# Patient Record
Sex: Male | Born: 1945 | Race: Black or African American | Hispanic: No | Marital: Married | Smoking: Never smoker
Health system: Southern US, Community
[De-identification: ages and names within clinical notes are randomized; demographics above are authoritative.]

## PROBLEM LIST (undated history)

## (undated) DIAGNOSIS — J45909 Unspecified asthma, uncomplicated: Secondary | ICD-10-CM

## (undated) DIAGNOSIS — I10 Essential (primary) hypertension: Secondary | ICD-10-CM

## (undated) DIAGNOSIS — I499 Cardiac arrhythmia, unspecified: Secondary | ICD-10-CM

## (undated) DIAGNOSIS — G629 Polyneuropathy, unspecified: Secondary | ICD-10-CM

## (undated) DIAGNOSIS — E7800 Pure hypercholesterolemia, unspecified: Secondary | ICD-10-CM

## (undated) DIAGNOSIS — H3500 Unspecified background retinopathy: Secondary | ICD-10-CM

## (undated) DIAGNOSIS — E785 Hyperlipidemia, unspecified: Secondary | ICD-10-CM

## (undated) DIAGNOSIS — D649 Anemia, unspecified: Secondary | ICD-10-CM

## (undated) DIAGNOSIS — E119 Type 2 diabetes mellitus without complications: Secondary | ICD-10-CM

## (undated) HISTORY — DX: Polyneuropathy, unspecified: G62.9

## (undated) HISTORY — DX: Pure hypercholesterolemia, unspecified: E78.00

## (undated) HISTORY — DX: Essential (primary) hypertension: I10

## (undated) HISTORY — DX: Unspecified background retinopathy: H35.00

## (undated) HISTORY — DX: Hyperlipidemia, unspecified: E78.5

## (undated) HISTORY — DX: Type 2 diabetes mellitus without complications: E11.9

## (undated) HISTORY — DX: Anemia, unspecified: D64.9

## (undated) HISTORY — DX: Unspecified asthma, uncomplicated: J45.909

## (undated) HISTORY — PX: ANAL FISSURE REPAIR: SHX2312

## (undated) HISTORY — PX: OTHER SURGICAL HISTORY: SHX169

---

## 2002-08-06 ENCOUNTER — Ambulatory Visit (HOSPITAL_COMMUNITY): Admission: RE | Admit: 2002-08-06 | Discharge: 2002-08-06 | Payer: Self-pay | Admitting: Gastroenterology

## 2002-12-28 ENCOUNTER — Encounter: Admission: RE | Admit: 2002-12-28 | Discharge: 2003-03-28 | Payer: Self-pay | Admitting: Internal Medicine

## 2003-04-27 ENCOUNTER — Encounter: Admission: RE | Admit: 2003-04-27 | Discharge: 2003-07-26 | Payer: Self-pay | Admitting: Internal Medicine

## 2011-07-18 DIAGNOSIS — Z23 Encounter for immunization: Secondary | ICD-10-CM | POA: Diagnosis not present

## 2011-08-23 DIAGNOSIS — Z23 Encounter for immunization: Secondary | ICD-10-CM | POA: Diagnosis not present

## 2011-08-23 DIAGNOSIS — E1139 Type 2 diabetes mellitus with other diabetic ophthalmic complication: Secondary | ICD-10-CM | POA: Diagnosis not present

## 2011-08-23 DIAGNOSIS — E1339 Other specified diabetes mellitus with other diabetic ophthalmic complication: Secondary | ICD-10-CM | POA: Diagnosis not present

## 2011-08-23 DIAGNOSIS — E1165 Type 2 diabetes mellitus with hyperglycemia: Secondary | ICD-10-CM | POA: Diagnosis not present

## 2011-08-23 DIAGNOSIS — I10 Essential (primary) hypertension: Secondary | ICD-10-CM | POA: Diagnosis not present

## 2011-08-23 DIAGNOSIS — E785 Hyperlipidemia, unspecified: Secondary | ICD-10-CM | POA: Diagnosis not present

## 2011-08-23 DIAGNOSIS — IMO0001 Reserved for inherently not codable concepts without codable children: Secondary | ICD-10-CM | POA: Diagnosis not present

## 2011-12-04 DIAGNOSIS — H4011X0 Primary open-angle glaucoma, stage unspecified: Secondary | ICD-10-CM | POA: Diagnosis not present

## 2011-12-04 DIAGNOSIS — E7800 Pure hypercholesterolemia, unspecified: Secondary | ICD-10-CM | POA: Diagnosis not present

## 2011-12-04 DIAGNOSIS — E1139 Type 2 diabetes mellitus with other diabetic ophthalmic complication: Secondary | ICD-10-CM | POA: Diagnosis not present

## 2011-12-04 DIAGNOSIS — E1165 Type 2 diabetes mellitus with hyperglycemia: Secondary | ICD-10-CM | POA: Diagnosis not present

## 2011-12-04 DIAGNOSIS — I10 Essential (primary) hypertension: Secondary | ICD-10-CM | POA: Diagnosis not present

## 2011-12-04 DIAGNOSIS — E1339 Other specified diabetes mellitus with other diabetic ophthalmic complication: Secondary | ICD-10-CM | POA: Diagnosis not present

## 2011-12-04 DIAGNOSIS — Z0000 Encounter for general adult medical examination without abnormal findings: Secondary | ICD-10-CM | POA: Diagnosis not present

## 2012-01-10 DIAGNOSIS — E11319 Type 2 diabetes mellitus with unspecified diabetic retinopathy without macular edema: Secondary | ICD-10-CM | POA: Diagnosis not present

## 2012-01-10 DIAGNOSIS — E119 Type 2 diabetes mellitus without complications: Secondary | ICD-10-CM | POA: Diagnosis not present

## 2012-01-10 DIAGNOSIS — H4011X0 Primary open-angle glaucoma, stage unspecified: Secondary | ICD-10-CM | POA: Diagnosis not present

## 2012-04-01 DIAGNOSIS — E1139 Type 2 diabetes mellitus with other diabetic ophthalmic complication: Secondary | ICD-10-CM | POA: Diagnosis not present

## 2012-04-01 DIAGNOSIS — I10 Essential (primary) hypertension: Secondary | ICD-10-CM | POA: Diagnosis not present

## 2012-04-01 DIAGNOSIS — E1339 Other specified diabetes mellitus with other diabetic ophthalmic complication: Secondary | ICD-10-CM | POA: Diagnosis not present

## 2012-04-01 DIAGNOSIS — E785 Hyperlipidemia, unspecified: Secondary | ICD-10-CM | POA: Diagnosis not present

## 2012-04-01 DIAGNOSIS — Z23 Encounter for immunization: Secondary | ICD-10-CM | POA: Diagnosis not present

## 2012-07-25 DIAGNOSIS — J069 Acute upper respiratory infection, unspecified: Secondary | ICD-10-CM | POA: Diagnosis not present

## 2012-08-08 DIAGNOSIS — I10 Essential (primary) hypertension: Secondary | ICD-10-CM | POA: Diagnosis not present

## 2012-08-08 DIAGNOSIS — E1339 Other specified diabetes mellitus with other diabetic ophthalmic complication: Secondary | ICD-10-CM | POA: Diagnosis not present

## 2012-08-08 DIAGNOSIS — IMO0001 Reserved for inherently not codable concepts without codable children: Secondary | ICD-10-CM | POA: Diagnosis not present

## 2012-08-08 DIAGNOSIS — E7800 Pure hypercholesterolemia, unspecified: Secondary | ICD-10-CM | POA: Diagnosis not present

## 2012-09-08 DIAGNOSIS — M25519 Pain in unspecified shoulder: Secondary | ICD-10-CM | POA: Diagnosis not present

## 2012-09-10 DIAGNOSIS — M67919 Unspecified disorder of synovium and tendon, unspecified shoulder: Secondary | ICD-10-CM | POA: Diagnosis not present

## 2012-09-10 DIAGNOSIS — M25519 Pain in unspecified shoulder: Secondary | ICD-10-CM | POA: Diagnosis not present

## 2012-12-09 DIAGNOSIS — I10 Essential (primary) hypertension: Secondary | ICD-10-CM | POA: Diagnosis not present

## 2012-12-09 DIAGNOSIS — E1139 Type 2 diabetes mellitus with other diabetic ophthalmic complication: Secondary | ICD-10-CM | POA: Diagnosis not present

## 2012-12-09 DIAGNOSIS — E785 Hyperlipidemia, unspecified: Secondary | ICD-10-CM | POA: Diagnosis not present

## 2012-12-09 DIAGNOSIS — Z0000 Encounter for general adult medical examination without abnormal findings: Secondary | ICD-10-CM | POA: Diagnosis not present

## 2012-12-09 DIAGNOSIS — E1339 Other specified diabetes mellitus with other diabetic ophthalmic complication: Secondary | ICD-10-CM | POA: Diagnosis not present

## 2012-12-23 DIAGNOSIS — E119 Type 2 diabetes mellitus without complications: Secondary | ICD-10-CM | POA: Diagnosis not present

## 2012-12-23 DIAGNOSIS — E11319 Type 2 diabetes mellitus with unspecified diabetic retinopathy without macular edema: Secondary | ICD-10-CM | POA: Diagnosis not present

## 2012-12-23 DIAGNOSIS — H2510 Age-related nuclear cataract, unspecified eye: Secondary | ICD-10-CM | POA: Diagnosis not present

## 2012-12-31 DIAGNOSIS — E1139 Type 2 diabetes mellitus with other diabetic ophthalmic complication: Secondary | ICD-10-CM | POA: Diagnosis not present

## 2012-12-31 DIAGNOSIS — E11311 Type 2 diabetes mellitus with unspecified diabetic retinopathy with macular edema: Secondary | ICD-10-CM | POA: Diagnosis not present

## 2012-12-31 DIAGNOSIS — E11329 Type 2 diabetes mellitus with mild nonproliferative diabetic retinopathy without macular edema: Secondary | ICD-10-CM | POA: Diagnosis not present

## 2013-04-07 DIAGNOSIS — I10 Essential (primary) hypertension: Secondary | ICD-10-CM | POA: Diagnosis not present

## 2013-04-07 DIAGNOSIS — E7800 Pure hypercholesterolemia, unspecified: Secondary | ICD-10-CM | POA: Diagnosis not present

## 2013-04-07 DIAGNOSIS — E1339 Other specified diabetes mellitus with other diabetic ophthalmic complication: Secondary | ICD-10-CM | POA: Diagnosis not present

## 2013-04-07 DIAGNOSIS — E1139 Type 2 diabetes mellitus with other diabetic ophthalmic complication: Secondary | ICD-10-CM | POA: Diagnosis not present

## 2013-04-10 DIAGNOSIS — E11311 Type 2 diabetes mellitus with unspecified diabetic retinopathy with macular edema: Secondary | ICD-10-CM | POA: Diagnosis not present

## 2013-04-11 DIAGNOSIS — Z23 Encounter for immunization: Secondary | ICD-10-CM | POA: Diagnosis not present

## 2013-04-24 DIAGNOSIS — H4011X0 Primary open-angle glaucoma, stage unspecified: Secondary | ICD-10-CM | POA: Diagnosis not present

## 2013-06-30 DIAGNOSIS — H4011X0 Primary open-angle glaucoma, stage unspecified: Secondary | ICD-10-CM | POA: Diagnosis not present

## 2013-07-08 DIAGNOSIS — K5730 Diverticulosis of large intestine without perforation or abscess without bleeding: Secondary | ICD-10-CM | POA: Diagnosis not present

## 2013-07-08 DIAGNOSIS — Z1211 Encounter for screening for malignant neoplasm of colon: Secondary | ICD-10-CM | POA: Diagnosis not present

## 2013-08-04 DIAGNOSIS — E1339 Other specified diabetes mellitus with other diabetic ophthalmic complication: Secondary | ICD-10-CM | POA: Diagnosis not present

## 2013-08-04 DIAGNOSIS — E1139 Type 2 diabetes mellitus with other diabetic ophthalmic complication: Secondary | ICD-10-CM | POA: Diagnosis not present

## 2013-08-04 DIAGNOSIS — E785 Hyperlipidemia, unspecified: Secondary | ICD-10-CM | POA: Diagnosis not present

## 2013-08-04 DIAGNOSIS — E1165 Type 2 diabetes mellitus with hyperglycemia: Secondary | ICD-10-CM | POA: Diagnosis not present

## 2013-08-04 DIAGNOSIS — I10 Essential (primary) hypertension: Secondary | ICD-10-CM | POA: Diagnosis not present

## 2013-10-29 DIAGNOSIS — H4011X0 Primary open-angle glaucoma, stage unspecified: Secondary | ICD-10-CM | POA: Diagnosis not present

## 2013-10-29 DIAGNOSIS — E1139 Type 2 diabetes mellitus with other diabetic ophthalmic complication: Secondary | ICD-10-CM | POA: Diagnosis not present

## 2013-11-30 DIAGNOSIS — H4011X0 Primary open-angle glaucoma, stage unspecified: Secondary | ICD-10-CM | POA: Diagnosis not present

## 2013-12-15 DIAGNOSIS — Z125 Encounter for screening for malignant neoplasm of prostate: Secondary | ICD-10-CM | POA: Diagnosis not present

## 2013-12-15 DIAGNOSIS — E1139 Type 2 diabetes mellitus with other diabetic ophthalmic complication: Secondary | ICD-10-CM | POA: Diagnosis not present

## 2013-12-15 DIAGNOSIS — I10 Essential (primary) hypertension: Secondary | ICD-10-CM | POA: Diagnosis not present

## 2013-12-15 DIAGNOSIS — Z0000 Encounter for general adult medical examination without abnormal findings: Secondary | ICD-10-CM | POA: Diagnosis not present

## 2013-12-15 DIAGNOSIS — Z1331 Encounter for screening for depression: Secondary | ICD-10-CM | POA: Diagnosis not present

## 2013-12-15 DIAGNOSIS — E785 Hyperlipidemia, unspecified: Secondary | ICD-10-CM | POA: Diagnosis not present

## 2013-12-15 DIAGNOSIS — E1339 Other specified diabetes mellitus with other diabetic ophthalmic complication: Secondary | ICD-10-CM | POA: Diagnosis not present

## 2013-12-18 DIAGNOSIS — E11329 Type 2 diabetes mellitus with mild nonproliferative diabetic retinopathy without macular edema: Secondary | ICD-10-CM | POA: Diagnosis not present

## 2013-12-18 DIAGNOSIS — E11311 Type 2 diabetes mellitus with unspecified diabetic retinopathy with macular edema: Secondary | ICD-10-CM | POA: Diagnosis not present

## 2013-12-18 DIAGNOSIS — E1139 Type 2 diabetes mellitus with other diabetic ophthalmic complication: Secondary | ICD-10-CM | POA: Diagnosis not present

## 2014-04-05 DIAGNOSIS — E780 Pure hypercholesterolemia: Secondary | ICD-10-CM | POA: Diagnosis not present

## 2014-04-05 DIAGNOSIS — I10 Essential (primary) hypertension: Secondary | ICD-10-CM | POA: Diagnosis not present

## 2014-04-05 DIAGNOSIS — Z23 Encounter for immunization: Secondary | ICD-10-CM | POA: Diagnosis not present

## 2014-04-05 DIAGNOSIS — E08329 Diabetes mellitus due to underlying condition with mild nonproliferative diabetic retinopathy without macular edema: Secondary | ICD-10-CM | POA: Diagnosis not present

## 2014-08-09 DIAGNOSIS — E785 Hyperlipidemia, unspecified: Secondary | ICD-10-CM | POA: Diagnosis not present

## 2014-08-09 DIAGNOSIS — I10 Essential (primary) hypertension: Secondary | ICD-10-CM | POA: Diagnosis not present

## 2014-08-09 DIAGNOSIS — E11319 Type 2 diabetes mellitus with unspecified diabetic retinopathy without macular edema: Secondary | ICD-10-CM | POA: Diagnosis not present

## 2014-08-13 DIAGNOSIS — E11331 Type 2 diabetes mellitus with moderate nonproliferative diabetic retinopathy with macular edema: Secondary | ICD-10-CM | POA: Diagnosis not present

## 2014-08-13 DIAGNOSIS — H25013 Cortical age-related cataract, bilateral: Secondary | ICD-10-CM | POA: Diagnosis not present

## 2014-08-13 DIAGNOSIS — H2513 Age-related nuclear cataract, bilateral: Secondary | ICD-10-CM | POA: Diagnosis not present

## 2014-08-13 DIAGNOSIS — H40013 Open angle with borderline findings, low risk, bilateral: Secondary | ICD-10-CM | POA: Diagnosis not present

## 2014-08-17 DIAGNOSIS — E11339 Type 2 diabetes mellitus with moderate nonproliferative diabetic retinopathy without macular edema: Secondary | ICD-10-CM | POA: Diagnosis not present

## 2014-08-17 DIAGNOSIS — E11331 Type 2 diabetes mellitus with moderate nonproliferative diabetic retinopathy with macular edema: Secondary | ICD-10-CM | POA: Diagnosis not present

## 2014-09-30 DIAGNOSIS — E11331 Type 2 diabetes mellitus with moderate nonproliferative diabetic retinopathy with macular edema: Secondary | ICD-10-CM | POA: Diagnosis not present

## 2014-11-11 DIAGNOSIS — E11331 Type 2 diabetes mellitus with moderate nonproliferative diabetic retinopathy with macular edema: Secondary | ICD-10-CM | POA: Diagnosis not present

## 2014-12-13 DIAGNOSIS — H40013 Open angle with borderline findings, low risk, bilateral: Secondary | ICD-10-CM | POA: Diagnosis not present

## 2014-12-21 DIAGNOSIS — E11331 Type 2 diabetes mellitus with moderate nonproliferative diabetic retinopathy with macular edema: Secondary | ICD-10-CM | POA: Diagnosis not present

## 2014-12-21 DIAGNOSIS — E11339 Type 2 diabetes mellitus with moderate nonproliferative diabetic retinopathy without macular edema: Secondary | ICD-10-CM | POA: Diagnosis not present

## 2014-12-21 DIAGNOSIS — Z0000 Encounter for general adult medical examination without abnormal findings: Secondary | ICD-10-CM | POA: Diagnosis not present

## 2014-12-21 DIAGNOSIS — Z794 Long term (current) use of insulin: Secondary | ICD-10-CM | POA: Diagnosis not present

## 2014-12-21 DIAGNOSIS — Z1389 Encounter for screening for other disorder: Secondary | ICD-10-CM | POA: Diagnosis not present

## 2014-12-21 DIAGNOSIS — Z125 Encounter for screening for malignant neoplasm of prostate: Secondary | ICD-10-CM | POA: Diagnosis not present

## 2014-12-21 DIAGNOSIS — E11319 Type 2 diabetes mellitus with unspecified diabetic retinopathy without macular edema: Secondary | ICD-10-CM | POA: Diagnosis not present

## 2014-12-21 DIAGNOSIS — I10 Essential (primary) hypertension: Secondary | ICD-10-CM | POA: Diagnosis not present

## 2014-12-21 DIAGNOSIS — E780 Pure hypercholesterolemia: Secondary | ICD-10-CM | POA: Diagnosis not present

## 2014-12-21 DIAGNOSIS — E08319 Diabetes mellitus due to underlying condition with unspecified diabetic retinopathy without macular edema: Secondary | ICD-10-CM | POA: Diagnosis not present

## 2015-02-07 DIAGNOSIS — H40013 Open angle with borderline findings, low risk, bilateral: Secondary | ICD-10-CM | POA: Diagnosis not present

## 2015-02-08 DIAGNOSIS — E11331 Type 2 diabetes mellitus with moderate nonproliferative diabetic retinopathy with macular edema: Secondary | ICD-10-CM | POA: Diagnosis not present

## 2015-02-24 DIAGNOSIS — Z23 Encounter for immunization: Secondary | ICD-10-CM | POA: Diagnosis not present

## 2015-03-22 DIAGNOSIS — E11331 Type 2 diabetes mellitus with moderate nonproliferative diabetic retinopathy with macular edema: Secondary | ICD-10-CM | POA: Diagnosis not present

## 2015-04-19 DIAGNOSIS — E113311 Type 2 diabetes mellitus with moderate nonproliferative diabetic retinopathy with macular edema, right eye: Secondary | ICD-10-CM | POA: Diagnosis not present

## 2015-05-17 DIAGNOSIS — E113311 Type 2 diabetes mellitus with moderate nonproliferative diabetic retinopathy with macular edema, right eye: Secondary | ICD-10-CM | POA: Diagnosis not present

## 2015-06-14 DIAGNOSIS — E113311 Type 2 diabetes mellitus with moderate nonproliferative diabetic retinopathy with macular edema, right eye: Secondary | ICD-10-CM | POA: Diagnosis not present

## 2015-07-21 DIAGNOSIS — E113211 Type 2 diabetes mellitus with mild nonproliferative diabetic retinopathy with macular edema, right eye: Secondary | ICD-10-CM | POA: Diagnosis not present

## 2015-07-21 DIAGNOSIS — E113292 Type 2 diabetes mellitus with mild nonproliferative diabetic retinopathy without macular edema, left eye: Secondary | ICD-10-CM | POA: Diagnosis not present

## 2015-08-09 DIAGNOSIS — E113392 Type 2 diabetes mellitus with moderate nonproliferative diabetic retinopathy without macular edema, left eye: Secondary | ICD-10-CM | POA: Diagnosis not present

## 2015-08-09 DIAGNOSIS — H40012 Open angle with borderline findings, low risk, left eye: Secondary | ICD-10-CM | POA: Diagnosis not present

## 2015-08-09 DIAGNOSIS — H40011 Open angle with borderline findings, low risk, right eye: Secondary | ICD-10-CM | POA: Diagnosis not present

## 2015-08-09 DIAGNOSIS — E11311 Type 2 diabetes mellitus with unspecified diabetic retinopathy with macular edema: Secondary | ICD-10-CM | POA: Diagnosis not present

## 2015-09-08 DIAGNOSIS — E113211 Type 2 diabetes mellitus with mild nonproliferative diabetic retinopathy with macular edema, right eye: Secondary | ICD-10-CM | POA: Diagnosis not present

## 2015-10-18 DIAGNOSIS — I10 Essential (primary) hypertension: Secondary | ICD-10-CM | POA: Diagnosis not present

## 2015-10-18 DIAGNOSIS — E08319 Diabetes mellitus due to underlying condition with unspecified diabetic retinopathy without macular edema: Secondary | ICD-10-CM | POA: Diagnosis not present

## 2015-10-18 DIAGNOSIS — E11319 Type 2 diabetes mellitus with unspecified diabetic retinopathy without macular edema: Secondary | ICD-10-CM | POA: Diagnosis not present

## 2015-10-18 DIAGNOSIS — E7800 Pure hypercholesterolemia, unspecified: Secondary | ICD-10-CM | POA: Diagnosis not present

## 2015-10-18 DIAGNOSIS — Z794 Long term (current) use of insulin: Secondary | ICD-10-CM | POA: Diagnosis not present

## 2015-11-17 DIAGNOSIS — E113211 Type 2 diabetes mellitus with mild nonproliferative diabetic retinopathy with macular edema, right eye: Secondary | ICD-10-CM | POA: Diagnosis not present

## 2015-12-07 DIAGNOSIS — H40011 Open angle with borderline findings, low risk, right eye: Secondary | ICD-10-CM | POA: Diagnosis not present

## 2015-12-07 DIAGNOSIS — H40012 Open angle with borderline findings, low risk, left eye: Secondary | ICD-10-CM | POA: Diagnosis not present

## 2016-01-19 DIAGNOSIS — Z794 Long term (current) use of insulin: Secondary | ICD-10-CM | POA: Diagnosis not present

## 2016-01-19 DIAGNOSIS — I10 Essential (primary) hypertension: Secondary | ICD-10-CM | POA: Diagnosis not present

## 2016-01-19 DIAGNOSIS — Z125 Encounter for screening for malignant neoplasm of prostate: Secondary | ICD-10-CM | POA: Diagnosis not present

## 2016-01-19 DIAGNOSIS — E7800 Pure hypercholesterolemia, unspecified: Secondary | ICD-10-CM | POA: Diagnosis not present

## 2016-01-19 DIAGNOSIS — E08319 Diabetes mellitus due to underlying condition with unspecified diabetic retinopathy without macular edema: Secondary | ICD-10-CM | POA: Diagnosis not present

## 2016-01-19 DIAGNOSIS — Z1389 Encounter for screening for other disorder: Secondary | ICD-10-CM | POA: Diagnosis not present

## 2016-01-19 DIAGNOSIS — E11319 Type 2 diabetes mellitus with unspecified diabetic retinopathy without macular edema: Secondary | ICD-10-CM | POA: Diagnosis not present

## 2016-01-19 DIAGNOSIS — Z0000 Encounter for general adult medical examination without abnormal findings: Secondary | ICD-10-CM | POA: Diagnosis not present

## 2016-01-19 DIAGNOSIS — R05 Cough: Secondary | ICD-10-CM | POA: Diagnosis not present

## 2016-02-14 ENCOUNTER — Ambulatory Visit
Admission: RE | Admit: 2016-02-14 | Discharge: 2016-02-14 | Disposition: A | Discharge status: Home / Self Care | Payer: PRIVATE HEALTH INSURANCE | Source: Ambulatory Visit | Attending: Internal Medicine | Admitting: Internal Medicine

## 2016-02-14 ENCOUNTER — Other Ambulatory Visit: Payer: Self-pay | Admitting: Internal Medicine

## 2016-02-14 DIAGNOSIS — J209 Acute bronchitis, unspecified: Secondary | ICD-10-CM

## 2016-02-14 DIAGNOSIS — J45909 Unspecified asthma, uncomplicated: Secondary | ICD-10-CM | POA: Diagnosis not present

## 2016-02-14 DIAGNOSIS — Z7984 Long term (current) use of oral hypoglycemic drugs: Secondary | ICD-10-CM | POA: Diagnosis not present

## 2016-02-14 DIAGNOSIS — I10 Essential (primary) hypertension: Secondary | ICD-10-CM | POA: Diagnosis not present

## 2016-02-14 DIAGNOSIS — E11319 Type 2 diabetes mellitus with unspecified diabetic retinopathy without macular edema: Secondary | ICD-10-CM | POA: Diagnosis not present

## 2016-03-01 DIAGNOSIS — E113211 Type 2 diabetes mellitus with mild nonproliferative diabetic retinopathy with macular edema, right eye: Secondary | ICD-10-CM | POA: Diagnosis not present

## 2016-03-06 DIAGNOSIS — R05 Cough: Secondary | ICD-10-CM | POA: Diagnosis not present

## 2016-03-06 DIAGNOSIS — R21 Rash and other nonspecific skin eruption: Secondary | ICD-10-CM | POA: Diagnosis not present

## 2016-05-31 DIAGNOSIS — Z23 Encounter for immunization: Secondary | ICD-10-CM | POA: Diagnosis not present

## 2016-05-31 DIAGNOSIS — Z794 Long term (current) use of insulin: Secondary | ICD-10-CM | POA: Diagnosis not present

## 2016-05-31 DIAGNOSIS — E7800 Pure hypercholesterolemia, unspecified: Secondary | ICD-10-CM | POA: Diagnosis not present

## 2016-05-31 DIAGNOSIS — E1165 Type 2 diabetes mellitus with hyperglycemia: Secondary | ICD-10-CM | POA: Diagnosis not present

## 2016-05-31 DIAGNOSIS — E113212 Type 2 diabetes mellitus with mild nonproliferative diabetic retinopathy with macular edema, left eye: Secondary | ICD-10-CM | POA: Diagnosis not present

## 2016-05-31 DIAGNOSIS — E08319 Diabetes mellitus due to underlying condition with unspecified diabetic retinopathy without macular edema: Secondary | ICD-10-CM | POA: Diagnosis not present

## 2016-05-31 DIAGNOSIS — I10 Essential (primary) hypertension: Secondary | ICD-10-CM | POA: Diagnosis not present

## 2016-05-31 DIAGNOSIS — E113211 Type 2 diabetes mellitus with mild nonproliferative diabetic retinopathy with macular edema, right eye: Secondary | ICD-10-CM | POA: Diagnosis not present

## 2016-06-12 DIAGNOSIS — H40011 Open angle with borderline findings, low risk, right eye: Secondary | ICD-10-CM | POA: Diagnosis not present

## 2016-06-12 DIAGNOSIS — H2513 Age-related nuclear cataract, bilateral: Secondary | ICD-10-CM | POA: Diagnosis not present

## 2016-06-12 DIAGNOSIS — H524 Presbyopia: Secondary | ICD-10-CM | POA: Diagnosis not present

## 2016-06-12 DIAGNOSIS — H40012 Open angle with borderline findings, low risk, left eye: Secondary | ICD-10-CM | POA: Diagnosis not present

## 2016-10-02 DIAGNOSIS — E113213 Type 2 diabetes mellitus with mild nonproliferative diabetic retinopathy with macular edema, bilateral: Secondary | ICD-10-CM | POA: Diagnosis not present

## 2016-10-05 DIAGNOSIS — E7800 Pure hypercholesterolemia, unspecified: Secondary | ICD-10-CM | POA: Diagnosis not present

## 2016-10-05 DIAGNOSIS — E1165 Type 2 diabetes mellitus with hyperglycemia: Secondary | ICD-10-CM | POA: Diagnosis not present

## 2016-10-05 DIAGNOSIS — Z6830 Body mass index (BMI) 30.0-30.9, adult: Secondary | ICD-10-CM | POA: Diagnosis not present

## 2016-10-05 DIAGNOSIS — I10 Essential (primary) hypertension: Secondary | ICD-10-CM | POA: Diagnosis not present

## 2016-10-05 DIAGNOSIS — Z794 Long term (current) use of insulin: Secondary | ICD-10-CM | POA: Diagnosis not present

## 2016-10-05 DIAGNOSIS — E663 Overweight: Secondary | ICD-10-CM | POA: Diagnosis not present

## 2016-10-05 DIAGNOSIS — E08319 Diabetes mellitus due to underlying condition with unspecified diabetic retinopathy without macular edema: Secondary | ICD-10-CM | POA: Diagnosis not present

## 2016-11-09 DIAGNOSIS — H40011 Open angle with borderline findings, low risk, right eye: Secondary | ICD-10-CM | POA: Diagnosis not present

## 2016-11-09 DIAGNOSIS — H2513 Age-related nuclear cataract, bilateral: Secondary | ICD-10-CM | POA: Diagnosis not present

## 2017-01-21 DIAGNOSIS — H5213 Myopia, bilateral: Secondary | ICD-10-CM | POA: Diagnosis not present

## 2017-01-21 DIAGNOSIS — H40013 Open angle with borderline findings, low risk, bilateral: Secondary | ICD-10-CM | POA: Diagnosis not present

## 2017-01-21 DIAGNOSIS — E113211 Type 2 diabetes mellitus with mild nonproliferative diabetic retinopathy with macular edema, right eye: Secondary | ICD-10-CM | POA: Diagnosis not present

## 2017-01-21 DIAGNOSIS — H2513 Age-related nuclear cataract, bilateral: Secondary | ICD-10-CM | POA: Diagnosis not present

## 2017-01-23 DIAGNOSIS — E113211 Type 2 diabetes mellitus with mild nonproliferative diabetic retinopathy with macular edema, right eye: Secondary | ICD-10-CM | POA: Diagnosis not present

## 2017-01-31 DIAGNOSIS — Z6830 Body mass index (BMI) 30.0-30.9, adult: Secondary | ICD-10-CM | POA: Diagnosis not present

## 2017-01-31 DIAGNOSIS — Z0000 Encounter for general adult medical examination without abnormal findings: Secondary | ICD-10-CM | POA: Diagnosis not present

## 2017-01-31 DIAGNOSIS — E1165 Type 2 diabetes mellitus with hyperglycemia: Secondary | ICD-10-CM | POA: Diagnosis not present

## 2017-01-31 DIAGNOSIS — E663 Overweight: Secondary | ICD-10-CM | POA: Diagnosis not present

## 2017-01-31 DIAGNOSIS — E113211 Type 2 diabetes mellitus with mild nonproliferative diabetic retinopathy with macular edema, right eye: Secondary | ICD-10-CM | POA: Diagnosis not present

## 2017-01-31 DIAGNOSIS — Z125 Encounter for screening for malignant neoplasm of prostate: Secondary | ICD-10-CM | POA: Diagnosis not present

## 2017-01-31 DIAGNOSIS — E7800 Pure hypercholesterolemia, unspecified: Secondary | ICD-10-CM | POA: Diagnosis not present

## 2017-01-31 DIAGNOSIS — E08319 Diabetes mellitus due to underlying condition with unspecified diabetic retinopathy without macular edema: Secondary | ICD-10-CM | POA: Diagnosis not present

## 2017-01-31 DIAGNOSIS — I10 Essential (primary) hypertension: Secondary | ICD-10-CM | POA: Diagnosis not present

## 2017-01-31 DIAGNOSIS — Z1389 Encounter for screening for other disorder: Secondary | ICD-10-CM | POA: Diagnosis not present

## 2017-02-14 DIAGNOSIS — H25041 Posterior subcapsular polar age-related cataract, right eye: Secondary | ICD-10-CM | POA: Diagnosis not present

## 2017-02-14 DIAGNOSIS — H25811 Combined forms of age-related cataract, right eye: Secondary | ICD-10-CM | POA: Diagnosis not present

## 2017-02-14 DIAGNOSIS — H2511 Age-related nuclear cataract, right eye: Secondary | ICD-10-CM | POA: Diagnosis not present

## 2017-02-14 DIAGNOSIS — H25011 Cortical age-related cataract, right eye: Secondary | ICD-10-CM | POA: Diagnosis not present

## 2017-03-14 DIAGNOSIS — H2512 Age-related nuclear cataract, left eye: Secondary | ICD-10-CM | POA: Diagnosis not present

## 2017-03-14 DIAGNOSIS — H25812 Combined forms of age-related cataract, left eye: Secondary | ICD-10-CM | POA: Diagnosis not present

## 2017-03-14 DIAGNOSIS — H25042 Posterior subcapsular polar age-related cataract, left eye: Secondary | ICD-10-CM | POA: Diagnosis not present

## 2017-03-14 DIAGNOSIS — H25012 Cortical age-related cataract, left eye: Secondary | ICD-10-CM | POA: Diagnosis not present

## 2017-03-19 DIAGNOSIS — E113211 Type 2 diabetes mellitus with mild nonproliferative diabetic retinopathy with macular edema, right eye: Secondary | ICD-10-CM | POA: Diagnosis not present

## 2017-04-18 DIAGNOSIS — E113211 Type 2 diabetes mellitus with mild nonproliferative diabetic retinopathy with macular edema, right eye: Secondary | ICD-10-CM | POA: Diagnosis not present

## 2017-06-04 DIAGNOSIS — I10 Essential (primary) hypertension: Secondary | ICD-10-CM | POA: Diagnosis not present

## 2017-06-04 DIAGNOSIS — E7800 Pure hypercholesterolemia, unspecified: Secondary | ICD-10-CM | POA: Diagnosis not present

## 2017-06-04 DIAGNOSIS — Z0000 Encounter for general adult medical examination without abnormal findings: Secondary | ICD-10-CM | POA: Diagnosis not present

## 2017-06-04 DIAGNOSIS — E11319 Type 2 diabetes mellitus with unspecified diabetic retinopathy without macular edema: Secondary | ICD-10-CM | POA: Diagnosis not present

## 2017-06-04 DIAGNOSIS — Z23 Encounter for immunization: Secondary | ICD-10-CM | POA: Diagnosis not present

## 2017-06-04 DIAGNOSIS — E08319 Diabetes mellitus due to underlying condition with unspecified diabetic retinopathy without macular edema: Secondary | ICD-10-CM | POA: Diagnosis not present

## 2017-06-04 DIAGNOSIS — E663 Overweight: Secondary | ICD-10-CM | POA: Diagnosis not present

## 2017-06-06 DIAGNOSIS — E113211 Type 2 diabetes mellitus with mild nonproliferative diabetic retinopathy with macular edema, right eye: Secondary | ICD-10-CM | POA: Diagnosis not present

## 2017-07-23 DIAGNOSIS — E113211 Type 2 diabetes mellitus with mild nonproliferative diabetic retinopathy with macular edema, right eye: Secondary | ICD-10-CM | POA: Diagnosis not present

## 2017-07-23 DIAGNOSIS — E113212 Type 2 diabetes mellitus with mild nonproliferative diabetic retinopathy with macular edema, left eye: Secondary | ICD-10-CM | POA: Diagnosis not present

## 2017-08-06 DIAGNOSIS — H40013 Open angle with borderline findings, low risk, bilateral: Secondary | ICD-10-CM | POA: Diagnosis not present

## 2017-08-29 DIAGNOSIS — E113211 Type 2 diabetes mellitus with mild nonproliferative diabetic retinopathy with macular edema, right eye: Secondary | ICD-10-CM | POA: Diagnosis not present

## 2017-10-03 DIAGNOSIS — E11319 Type 2 diabetes mellitus with unspecified diabetic retinopathy without macular edema: Secondary | ICD-10-CM | POA: Diagnosis not present

## 2017-10-03 DIAGNOSIS — Z7984 Long term (current) use of oral hypoglycemic drugs: Secondary | ICD-10-CM | POA: Diagnosis not present

## 2017-10-03 DIAGNOSIS — E7800 Pure hypercholesterolemia, unspecified: Secondary | ICD-10-CM | POA: Diagnosis not present

## 2017-10-03 DIAGNOSIS — E663 Overweight: Secondary | ICD-10-CM | POA: Diagnosis not present

## 2017-10-03 DIAGNOSIS — I10 Essential (primary) hypertension: Secondary | ICD-10-CM | POA: Diagnosis not present

## 2017-10-08 DIAGNOSIS — E113211 Type 2 diabetes mellitus with mild nonproliferative diabetic retinopathy with macular edema, right eye: Secondary | ICD-10-CM | POA: Diagnosis not present

## 2017-10-15 DIAGNOSIS — E113211 Type 2 diabetes mellitus with mild nonproliferative diabetic retinopathy with macular edema, right eye: Secondary | ICD-10-CM | POA: Diagnosis not present

## 2017-10-15 DIAGNOSIS — E113212 Type 2 diabetes mellitus with mild nonproliferative diabetic retinopathy with macular edema, left eye: Secondary | ICD-10-CM | POA: Diagnosis not present

## 2017-11-28 DIAGNOSIS — E113211 Type 2 diabetes mellitus with mild nonproliferative diabetic retinopathy with macular edema, right eye: Secondary | ICD-10-CM | POA: Diagnosis not present

## 2018-01-06 DIAGNOSIS — H40013 Open angle with borderline findings, low risk, bilateral: Secondary | ICD-10-CM | POA: Diagnosis not present

## 2018-01-06 DIAGNOSIS — H5213 Myopia, bilateral: Secondary | ICD-10-CM | POA: Diagnosis not present

## 2018-01-06 DIAGNOSIS — Z961 Presence of intraocular lens: Secondary | ICD-10-CM | POA: Diagnosis not present

## 2018-01-06 DIAGNOSIS — E113211 Type 2 diabetes mellitus with mild nonproliferative diabetic retinopathy with macular edema, right eye: Secondary | ICD-10-CM | POA: Diagnosis not present

## 2018-01-16 DIAGNOSIS — E113211 Type 2 diabetes mellitus with mild nonproliferative diabetic retinopathy with macular edema, right eye: Secondary | ICD-10-CM | POA: Diagnosis not present

## 2018-02-10 ENCOUNTER — Other Ambulatory Visit: Payer: Self-pay | Admitting: Internal Medicine

## 2018-02-10 DIAGNOSIS — R29898 Other symptoms and signs involving the musculoskeletal system: Secondary | ICD-10-CM

## 2018-02-10 DIAGNOSIS — M6281 Muscle weakness (generalized): Secondary | ICD-10-CM | POA: Diagnosis not present

## 2018-02-10 DIAGNOSIS — M545 Low back pain: Secondary | ICD-10-CM | POA: Diagnosis not present

## 2018-02-16 ENCOUNTER — Ambulatory Visit
Admission: RE | Admit: 2018-02-16 | Discharge: 2018-02-16 | Disposition: A | Discharge status: Home / Self Care | Payer: Medicare Other | Source: Ambulatory Visit | Attending: Internal Medicine | Admitting: Internal Medicine

## 2018-02-16 DIAGNOSIS — R29898 Other symptoms and signs involving the musculoskeletal system: Secondary | ICD-10-CM

## 2018-02-16 DIAGNOSIS — M48061 Spinal stenosis, lumbar region without neurogenic claudication: Secondary | ICD-10-CM | POA: Diagnosis not present

## 2018-02-20 ENCOUNTER — Other Ambulatory Visit: Payer: Self-pay | Admitting: Internal Medicine

## 2018-02-20 ENCOUNTER — Ambulatory Visit
Admission: RE | Admit: 2018-02-20 | Discharge: 2018-02-20 | Disposition: A | Discharge status: Home / Self Care | Payer: Medicare Other | Source: Ambulatory Visit | Attending: Internal Medicine | Admitting: Internal Medicine

## 2018-02-20 DIAGNOSIS — S8991XA Unspecified injury of right lower leg, initial encounter: Secondary | ICD-10-CM | POA: Diagnosis not present

## 2018-02-20 DIAGNOSIS — M25551 Pain in right hip: Secondary | ICD-10-CM

## 2018-02-20 DIAGNOSIS — M25559 Pain in unspecified hip: Secondary | ICD-10-CM | POA: Diagnosis not present

## 2018-02-20 DIAGNOSIS — M25561 Pain in right knee: Secondary | ICD-10-CM | POA: Diagnosis not present

## 2018-02-20 DIAGNOSIS — M6281 Muscle weakness (generalized): Secondary | ICD-10-CM | POA: Diagnosis not present

## 2018-02-20 DIAGNOSIS — M25569 Pain in unspecified knee: Secondary | ICD-10-CM | POA: Diagnosis not present

## 2018-02-20 DIAGNOSIS — M1611 Unilateral primary osteoarthritis, right hip: Secondary | ICD-10-CM | POA: Diagnosis not present

## 2018-02-25 DIAGNOSIS — G629 Polyneuropathy, unspecified: Secondary | ICD-10-CM | POA: Diagnosis not present

## 2018-02-27 DIAGNOSIS — E113212 Type 2 diabetes mellitus with mild nonproliferative diabetic retinopathy with macular edema, left eye: Secondary | ICD-10-CM | POA: Diagnosis not present

## 2018-02-27 DIAGNOSIS — E113211 Type 2 diabetes mellitus with mild nonproliferative diabetic retinopathy with macular edema, right eye: Secondary | ICD-10-CM | POA: Diagnosis not present

## 2018-02-28 DIAGNOSIS — E663 Overweight: Secondary | ICD-10-CM | POA: Diagnosis not present

## 2018-02-28 DIAGNOSIS — Z6830 Body mass index (BMI) 30.0-30.9, adult: Secondary | ICD-10-CM | POA: Diagnosis not present

## 2018-02-28 DIAGNOSIS — Z0000 Encounter for general adult medical examination without abnormal findings: Secondary | ICD-10-CM | POA: Diagnosis not present

## 2018-02-28 DIAGNOSIS — E7800 Pure hypercholesterolemia, unspecified: Secondary | ICD-10-CM | POA: Diagnosis not present

## 2018-02-28 DIAGNOSIS — E119 Type 2 diabetes mellitus without complications: Secondary | ICD-10-CM | POA: Diagnosis not present

## 2018-02-28 DIAGNOSIS — Z1389 Encounter for screening for other disorder: Secondary | ICD-10-CM | POA: Diagnosis not present

## 2018-02-28 DIAGNOSIS — Z7189 Other specified counseling: Secondary | ICD-10-CM | POA: Diagnosis not present

## 2018-02-28 DIAGNOSIS — I10 Essential (primary) hypertension: Secondary | ICD-10-CM | POA: Diagnosis not present

## 2018-02-28 DIAGNOSIS — Z794 Long term (current) use of insulin: Secondary | ICD-10-CM | POA: Diagnosis not present

## 2018-03-10 ENCOUNTER — Encounter: Payer: Self-pay | Admitting: Neurology

## 2018-03-25 DIAGNOSIS — M25561 Pain in right knee: Secondary | ICD-10-CM | POA: Diagnosis not present

## 2018-03-28 ENCOUNTER — Ambulatory Visit (INDEPENDENT_AMBULATORY_CARE_PROVIDER_SITE_OTHER): Payer: Medicare Other | Admitting: Neurology

## 2018-03-28 ENCOUNTER — Encounter: Payer: Self-pay | Admitting: Neurology

## 2018-03-28 ENCOUNTER — Other Ambulatory Visit (INDEPENDENT_AMBULATORY_CARE_PROVIDER_SITE_OTHER): Payer: Medicare Other

## 2018-03-28 VITALS — BP 110/60 | HR 92 | Ht 65.5 in | Wt 182.1 lb

## 2018-03-28 DIAGNOSIS — G629 Polyneuropathy, unspecified: Secondary | ICD-10-CM | POA: Diagnosis not present

## 2018-03-28 DIAGNOSIS — R29898 Other symptoms and signs involving the musculoskeletal system: Secondary | ICD-10-CM

## 2018-03-28 DIAGNOSIS — R6889 Other general symptoms and signs: Secondary | ICD-10-CM

## 2018-03-28 LAB — FOLATE: Folate: 17.8 ng/mL (ref >5.9)

## 2018-03-28 LAB — C-REACTIVE PROTEIN: CRP: 0.2 mg/dL — ABNORMAL LOW (ref 0.5–20.0)

## 2018-03-28 LAB — SEDIMENTATION RATE: Sed Rate: 27 mm/hr — ABNORMAL HIGH (ref 0–20)

## 2018-03-28 LAB — VITAMIN B12: Vitamin B-12: 309 pg/mL (ref 211–911)

## 2018-03-28 NOTE — Progress Notes (Signed)
[facility] Clinic Note - Initial Visit   Date: 03/28/18  Andrew Duncan MRN: [id number] DOB: 11-19-1945   Dear Dr. Delfina Redwood:  Thank you for your kind referral of Andrew Duncan for consultation of right leg weakness. Although his history is well known to you, please allow [place] to reiterate it for the purpose of our medical record. The patient was accompanied to the clinic by self.    History of Present Illness: Andrew Duncan is a 72 y.o. right-handed African American male with poorly controlled diabetes mellitus (HbA1c 9.9), hyperlipidemia, hypertension and presenting for evaluation of right leg weakness. Starting around late July/ealy August, he began having increased falls and right leg weakness.  He had suffered 4 falls because of right leg giving out and fortunately no injured himself. He has mild tingling over the medial thigh on the right.  He denies numbness/tingling of the lower legs.  He does not have severe low back pain. 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  No evidence of myopathy. He is also seeing Dr. French Ana for right knee pain and is scheduled to have MRI next week.  He is referred for further evaluation.    Out-side paper records, electronic medical record, and images have been reviewed where available and summarized as:  Labs 03/04/2018: Hemoglobin A1c 9.9, BUN 14, creatinine 0.85  MRI lumbar spine 02/17/2018:  1. Degenerative disc osteophyte with right greater than left facet hypertrophy at L5-S1 with resultant moderate bilateral L5 foraminal stenosis, slightly worse on the right. Posterior disc osteophyte also closely approximates the descending S1 nerve roots without frank impingement, greater on the  right. 2. Shallow left foraminal/extraforaminal disc protrusion at L3-4, contacting and potentially irritating the exiting left L3 nerve root. 3. Additional mild noncompressive disc bulging at L2-3 through L4-5 without significant stenosis or neural impingement.   Past Medical History:  Diagnosis Date  . Anemia   . Childhood asthma   . Diabetes ([place])   . Essential hypertension   . Hypercholesterolemia   . Hyperlipidemia   . Polyneuropathy   . Retinopathy     Past Surgical History:  Procedure Laterality Date  . ANAL FISSURE REPAIR    . left elbow fracture pinning     pinning     Medications:  Outpatient Encounter Medications as of 03/28/2018  Medication Sig  . Albuterol Sulfate (PROAIR HFA IN) Inhale into the lungs.  Marland Kitchen aspirin EC 81 MG tablet Take 81 mg by mouth daily.  . BD PEN NEEDLE NANO U/F 32G X 4 MM MISC USE NEEDLES FOR INJECTION TWICE A DAY  . DIPHENHYDRAMINE HCL PO Take 10 mg by mouth.  Marland Kitchen glipiZIDE (GLUCOTROL XL) 10 MG 24 hr tablet Take 10 mg by mouth daily.  Marland Kitchen JANUVIA 100 MG tablet Take 100 mg by mouth daily.  Marland Kitchen LANTUS SOLOSTAR 100 UNIT/ML Solostar Pen INJECT 43 UNITS ONCE A DAY SUBCUTANEOUSLY  . latanoprost (XALATAN) 0.005 % ophthalmic solution APPLY 1 DROP IN BOTH EYES EVERY NIGHT  . losartan (COZAAR) 25 MG tablet   . metFORMIN (GLUCOPHAGE) 500 MG tablet   . pravastatin (PRAVACHOL) 20 MG tablet Take 20 mg by mouth daily.  . sildenafil (VIAGRA) 100 MG tablet Take 100 mg by mouth daily as needed for erectile dysfunction.  Marland Kitchen  timolol (TIMOPTIC) 0.5 % ophthalmic solution INSTILL 1 DROP INTO BOTH EYES TWICE A DAY   No facility-administered encounter medications on file as of 03/28/2018.      Allergies: No Known Allergies  Family History: Family History  Problem Relation Age of Onset  . Diabetes Mother   . Stroke Mother     Social History: Social History   Tobacco Use  . Smoking status: Never Smoker  . Smokeless tobacco: Never Used  Substance Use Topics   . Alcohol use: Yes    Comment: occasionally  . Drug use: Never   Social History   Social History Narrative   Lives with wife in a 2 story home.  Has 2 children.  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  Education: PhD.    Review of Systems:  CONSTITUTIONAL: No fevers, chills, night sweats, or weight loss.   EYES: No visual changes or eye pain ENT: No hearing changes.  No history of nose bleeds.   RESPIRATORY: No cough, wheezing and shortness of breath.   CARDIOVASCULAR: Negative for chest pain, and palpitations.   GI: Negative for abdominal discomfort, blood in stools or black stools.  No recent change in bowel habits.   GU:  No history of incontinence.   MUSCLOSKELETAL: +history of joint pain or swelling.  No myalgias.   SKIN: Negative for lesions, rash, and itching.   HEMATOLOGY/ONCOLOGY: Negative for prolonged bleeding, bruising easily, and swollen nodes.  No history of cancer.   ENDOCRINE: Negative for cold or heat intolerance, polydipsia or goiter.   PSYCH:  No depression or anxiety symptoms.   NEURO: As Above.   Vital Signs:  BP 110/60   Pulse 92   Ht 5' 5.5" (1.664 m)   Wt 182 lb 2 oz (82.6 kg)   SpO2 97%   BMI 29.85 kg/m    General Medical Exam:   General:  Well appearing, comfortable.   Eyes/ENT: see cranial nerve examination.   Neck: No masses appreciated.  Full range of motion without tenderness.  No carotid bruits. Respiratory:  Clear to auscultation, good air entry bilaterally.   Cardiac:  Regular rate and rhythm, no murmur.   Extremities:  No deformities, edema, or skin discoloration.  Skin:  No rashes or lesions.  Neurological Exam: MENTAL STATUS including orientation to time, place, person, recent and remote memory, attention span and concentration, language, and fund of knowledge is normal.  Speech is not dysarthric.  CRANIAL NERVES: II:  No visual field defects.  Increased vascularity of the retina bilaterally, normal discs.   III-IV-VI: Pupils equal round  and reactive to light.  Normal conjugate, extra-ocular eye movements in all directions of gaze.  No nystagmus.  No ptosis.   V:  Normal facial sensation   VII:  Normal facial symmetry and movements.  VIII:  Normal hearing and vestibular function.   IX-X:  Normal palatal movement.   XI:  Normal shoulder shrug and head rotation.   XII:  Normal tongue strength and range of motion, no deviation or fasciculation.  MOTOR:  No atrophy, fasciculations or abnormal movements.  No pronator drift.  Tone is normal.    Right Upper Extremity:    Left Upper Extremity:    Deltoid  5/5   Deltoid  5/5   Biceps  5/5   Biceps  5/5   Triceps  5/5   Triceps  5/5   Wrist extensors  5/5   Wrist extensors  5/5   Wrist flexors  5/5   Wrist  flexors  5/5   Finger extensors  5/5   Finger extensors  5/5   Finger flexors  5/5   Finger flexors  5/5   Dorsal interossei  5/5   Dorsal interossei  5/5   Abductor pollicis  5/5   Abductor pollicis  5/5   Tone (Ashworth scale)  0  Tone (Ashworth scale)  0   Right Lower Extremity:    Left Lower Extremity:    Hip flexors  4/5   Hip flexors  5/5   Hip extensors  5/5   Hip extensors  5/5   Adductor 3+/5  Adductor 5/5  Abductor 5/5  Abductor 5/5  Knee flexors  4-/5   Knee flexors  5/5   Knee extensors  5/5   Knee extensors  5/5   Dorsiflexors  5/5   Dorsiflexors  5/5   Plantarflexors  5/5   Plantarflexors  5/5   Toe extensors  5/5   Toe extensors  5/5   Toe flexors  5/5   Toe flexors  5/5   Tone (Ashworth scale)  0  Tone (Ashworth scale)  0   MSRs:  Right                                                                 Left brachioradialis 2+  brachioradialis 2+  biceps 2+  biceps 2+  triceps 2+  triceps 2+  patellar 0  Patellar 2+  ankle jerk 0  ankle jerk 0  Hoffman no  Hoffman no  plantar response down  plantar response down   SENSORY:  Vibration and temperature reduced at the ankles.  Sensation intact at the knees bilaterally.  Sensation is normal in the  hands.  COORDINATION/GAIT: Normal finger-to- nose-finger. Intact rapid alternating movements bilaterally. He is dragging the right leg when walking, appears slow and somewhat unsteady.   IMPRESSION: Right leg weakness predominately affecting right hip flexors (femoral, L2-4), adductors (obturator, L3-4, and knee extensors (femoral, L3-4).  However, imaging nor NCS/EMG localized his symptoms to this level.  Because of the inconsistency of his exam, I recommended repeat NCS/EMG to assess for lumbar plexopathy (diabetic amyotrophy).  With his distal and symmetric diabetic neuropathy, interpretation of EMG findings can be difficult, but side-to-side comparison would be helpful.  He does not wish to repeat testing at this time. I will refer him to PT for right leg strengthening.  Additionally, laboratory testing for ESR, CRP, vitamin B12, copper, and folate will be checked for other causes for neuropathy.    Return to clinic in 2 months  Thank you for allowing me to participate in patient's care.  If I can answer any additional questions, I would be pleased to do so.    Sincerely,    Donika K. Posey Pronto, DO

## 2018-03-28 NOTE — Patient Instructions (Addendum)
Start physical therapy  Check labs  Return to clinic on November 7th at 3:30p

## 2018-03-31 ENCOUNTER — Telehealth: Payer: Self-pay | Admitting: *Deleted

## 2018-03-31 DIAGNOSIS — M25561 Pain in right knee: Secondary | ICD-10-CM | POA: Diagnosis not present

## 2018-03-31 LAB — COPPER, SERUM: Copper: 112 ug/dL (ref 70–175)

## 2018-03-31 NOTE — Telephone Encounter (Signed)
-----   Message from Van Clines, MD sent at 03/31/2018 10:44 AM EDT ----- Pls let him know bloodwork looks good. Thanks

## 2018-03-31 NOTE — Telephone Encounter (Signed)
Patient given results

## 2018-04-03 DIAGNOSIS — M25461 Effusion, right knee: Secondary | ICD-10-CM | POA: Diagnosis not present

## 2018-04-03 DIAGNOSIS — M25561 Pain in right knee: Secondary | ICD-10-CM | POA: Diagnosis not present

## 2018-04-07 ENCOUNTER — Telehealth: Payer: Self-pay | Admitting: Neurology

## 2018-04-07 DIAGNOSIS — I10 Essential (primary) hypertension: Secondary | ICD-10-CM | POA: Diagnosis not present

## 2018-04-07 DIAGNOSIS — Z6828 Body mass index (BMI) 28.0-28.9, adult: Secondary | ICD-10-CM | POA: Diagnosis not present

## 2018-04-07 DIAGNOSIS — M47816 Spondylosis without myelopathy or radiculopathy, lumbar region: Secondary | ICD-10-CM | POA: Diagnosis not present

## 2018-04-07 NOTE — Telephone Encounter (Signed)
Patient is calling in wanting to know if there was any progress with trying to get him set up with therapy for his right leg. Please call him back at [phone]. Thanks!

## 2018-04-07 NOTE — Telephone Encounter (Signed)
Spoke with patient. Made him aware Dr. Eliane Decree assistant is not available today to ask status of referral. I have faxed a new referral to [facility] at [phone] with confirmation received. They will call patient directly to schedule.

## 2018-04-09 DIAGNOSIS — R2689 Other abnormalities of gait and mobility: Secondary | ICD-10-CM | POA: Diagnosis not present

## 2018-04-09 DIAGNOSIS — R531 Weakness: Secondary | ICD-10-CM | POA: Diagnosis not present

## 2018-04-09 DIAGNOSIS — R2681 Unsteadiness on feet: Secondary | ICD-10-CM | POA: Diagnosis not present

## 2018-04-09 DIAGNOSIS — M25561 Pain in right knee: Secondary | ICD-10-CM | POA: Diagnosis not present

## 2018-04-15 DIAGNOSIS — E113211 Type 2 diabetes mellitus with mild nonproliferative diabetic retinopathy with macular edema, right eye: Secondary | ICD-10-CM | POA: Diagnosis not present

## 2018-04-15 DIAGNOSIS — E113212 Type 2 diabetes mellitus with mild nonproliferative diabetic retinopathy with macular edema, left eye: Secondary | ICD-10-CM | POA: Diagnosis not present

## 2018-04-16 DIAGNOSIS — R2689 Other abnormalities of gait and mobility: Secondary | ICD-10-CM | POA: Diagnosis not present

## 2018-04-16 DIAGNOSIS — R2681 Unsteadiness on feet: Secondary | ICD-10-CM | POA: Diagnosis not present

## 2018-04-16 DIAGNOSIS — R531 Weakness: Secondary | ICD-10-CM | POA: Diagnosis not present

## 2018-04-16 DIAGNOSIS — M25561 Pain in right knee: Secondary | ICD-10-CM | POA: Diagnosis not present

## 2018-04-18 DIAGNOSIS — M25561 Pain in right knee: Secondary | ICD-10-CM | POA: Diagnosis not present

## 2018-04-18 DIAGNOSIS — R531 Weakness: Secondary | ICD-10-CM | POA: Diagnosis not present

## 2018-04-18 DIAGNOSIS — R2689 Other abnormalities of gait and mobility: Secondary | ICD-10-CM | POA: Diagnosis not present

## 2018-04-18 DIAGNOSIS — R2681 Unsteadiness on feet: Secondary | ICD-10-CM | POA: Diagnosis not present

## 2018-04-23 DIAGNOSIS — Z23 Encounter for immunization: Secondary | ICD-10-CM | POA: Diagnosis not present

## 2018-04-23 DIAGNOSIS — R2681 Unsteadiness on feet: Secondary | ICD-10-CM | POA: Diagnosis not present

## 2018-04-23 DIAGNOSIS — M25561 Pain in right knee: Secondary | ICD-10-CM | POA: Diagnosis not present

## 2018-04-23 DIAGNOSIS — R531 Weakness: Secondary | ICD-10-CM | POA: Diagnosis not present

## 2018-04-23 DIAGNOSIS — R2689 Other abnormalities of gait and mobility: Secondary | ICD-10-CM | POA: Diagnosis not present

## 2018-04-30 DIAGNOSIS — M25561 Pain in right knee: Secondary | ICD-10-CM | POA: Diagnosis not present

## 2018-04-30 DIAGNOSIS — R2689 Other abnormalities of gait and mobility: Secondary | ICD-10-CM | POA: Diagnosis not present

## 2018-04-30 DIAGNOSIS — R2681 Unsteadiness on feet: Secondary | ICD-10-CM | POA: Diagnosis not present

## 2018-04-30 DIAGNOSIS — R531 Weakness: Secondary | ICD-10-CM | POA: Diagnosis not present

## 2018-05-02 DIAGNOSIS — M25561 Pain in right knee: Secondary | ICD-10-CM | POA: Diagnosis not present

## 2018-05-02 DIAGNOSIS — R531 Weakness: Secondary | ICD-10-CM | POA: Diagnosis not present

## 2018-05-02 DIAGNOSIS — R2681 Unsteadiness on feet: Secondary | ICD-10-CM | POA: Diagnosis not present

## 2018-05-02 DIAGNOSIS — R2689 Other abnormalities of gait and mobility: Secondary | ICD-10-CM | POA: Diagnosis not present

## 2018-05-07 DIAGNOSIS — R2681 Unsteadiness on feet: Secondary | ICD-10-CM | POA: Diagnosis not present

## 2018-05-07 DIAGNOSIS — M25561 Pain in right knee: Secondary | ICD-10-CM | POA: Diagnosis not present

## 2018-05-07 DIAGNOSIS — R2689 Other abnormalities of gait and mobility: Secondary | ICD-10-CM | POA: Diagnosis not present

## 2018-05-07 DIAGNOSIS — R531 Weakness: Secondary | ICD-10-CM | POA: Diagnosis not present

## 2018-05-08 ENCOUNTER — Encounter: Payer: Self-pay | Admitting: Neurology

## 2018-05-08 ENCOUNTER — Ambulatory Visit (INDEPENDENT_AMBULATORY_CARE_PROVIDER_SITE_OTHER): Payer: Medicare Other | Admitting: Neurology

## 2018-05-08 VITALS — BP 120/70 | HR 86 | Ht 65.5 in | Wt 181.1 lb

## 2018-05-08 DIAGNOSIS — G629 Polyneuropathy, unspecified: Secondary | ICD-10-CM | POA: Diagnosis not present

## 2018-05-08 DIAGNOSIS — R29898 Other symptoms and signs involving the musculoskeletal system: Secondary | ICD-10-CM

## 2018-05-08 NOTE — Progress Notes (Signed)
Follow-up Visit   Date: 05/08/18    Andrew Duncan MRN: [id number] DOB: 1945/12/13   Interim History: Andrew Duncan is a 72 y.o. right-handed African American male with poorly controlled diabetes mellitus (HbA1c 9.9), hyperlipidemia, hypertension returning to the clinic for follow-up of right leg weakness.  The patient was accompanied to the clinic by self.  History of present illness: Starting around late July/ealy August, he began having increased falls and right leg weakness.  He had suffered 4 falls because of right leg giving out and fortunately no injured himself. He has mild tingling over the medial thigh on the right.  He denies numbness/tingling of the lower legs.  He does not have severe low back pain. 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  No evidence of myopathy. He is also seeing Dr. French Ana for right knee pain and is scheduled to have MRI next week.  He is referred for further evaluation.    UPDATE 05/08/2018:  He is here for follow-up visit.  There has not been any significant change in his right leg weakness.  He just recently started physical therapy 2 weeks ago.  Fortunately, he has not suffered any falls.  He denies any pain or paresthesias.  Medications:  Current Outpatient Medications on File Prior to Visit  Medication Sig Dispense Refill  . Albuterol Sulfate (PROAIR HFA IN) Inhale into the lungs.    Marland Kitchen aspirin EC 81 MG tablet Take 81 mg by mouth daily.    . BD PEN NEEDLE NANO U/F 32G X 4 MM MISC USE NEEDLES FOR INJECTION TWICE A DAY  3  . DIPHENHYDRAMINE HCL PO Take 10 mg by mouth.    Marland Kitchen glipiZIDE (GLUCOTROL XL) 10 MG 24 hr tablet Take 10 mg by mouth daily.  3  . JANUVIA 100 MG tablet Take 100 mg by mouth daily.  3  .  LANTUS SOLOSTAR 100 UNIT/ML Solostar Pen INJECT 43 UNITS ONCE A DAY SUBCUTANEOUSLY  4  . latanoprost (XALATAN) 0.005 % ophthalmic solution APPLY 1 DROP IN BOTH EYES EVERY NIGHT  3  . losartan (COZAAR) 25 MG tablet     . metFORMIN (GLUCOPHAGE) 500 MG tablet     . pravastatin (PRAVACHOL) 20 MG tablet Take 20 mg by mouth daily.  1  . sildenafil (VIAGRA) 100 MG tablet Take 100 mg by mouth daily as needed for erectile dysfunction.    . timolol (TIMOPTIC) 0.5 % ophthalmic solution INSTILL 1 DROP INTO BOTH EYES TWICE A DAY  3   No current facility-administered medications on file prior to visit.     Allergies: No Known Allergies  Review of Systems:  CONSTITUTIONAL: No fevers, chills, night sweats, or weight loss.  EYES: No visual changes or eye pain ENT: No hearing changes.  No history of nose bleeds.   RESPIRATORY: No cough, wheezing and shortness of breath.   CARDIOVASCULAR: Negative for chest pain, and palpitations.   GI: Negative for abdominal discomfort, blood in stools or black stools.  No recent change in bowel habits.   GU:  No history of incontinence.   MUSCLOSKELETAL: No history of joint pain or swelling.  No myalgias.   SKIN: Negative for lesions, rash, and itching.   ENDOCRINE: Negative for cold or heat intolerance, polydipsia or goiter.  PSYCH:  No depression or anxiety symptoms.   NEURO: As Above.   Vital Signs:  BP 120/70   Pulse 86   Ht 5' 5.5" (1.664 m)   Wt 181 lb 2 oz (82.2 kg)   SpO2 98%   BMI 29.68 kg/m    General Medical Exam:   General:  Well appearing, comfortable  Eyes/ENT: see cranial nerve examination.   Neck: No masses appreciated.  Full range of motion without tenderness.  No carotid bruits. Respiratory:  Clear to auscultation, good air entry bilaterally.   Cardiac:  Regular rate and rhythm, no murmur.   Ext:  No edema  Neurological Exam: MENTAL STATUS including orientation to time, place, person, recent and remote memory, attention span and  concentration, language, and fund of knowledge is normal.  Speech is not dysarthric.  CRANIAL NERVES:  Face is symmetric.   MOTOR:  No atrophy, fasciculations or abnormal movements.  No pronator drift.  Tone is normal.    Right Upper Extremity:    Left Upper Extremity:    Deltoid  5/5   Deltoid  5/5   Biceps  5/5   Biceps  5/5   Triceps  5/5   Triceps  5/5   Wrist extensors  5/5   Wrist extensors  5/5   Wrist flexors  5/5   Wrist flexors  5/5   Finger extensors  5/5   Finger extensors  5/5   Finger flexors  5/5   Finger flexors  5/5   Dorsal interossei  5/5   Dorsal interossei  5/5   Abductor pollicis  5/5   Abductor pollicis  5/5   Tone (Ashworth scale)  0  Tone (Ashworth scale)  0   Right Lower Extremity:    Left Lower Extremity:    Hip flexors  4-/5   Hip flexors  5/5   Hip extensors  5/5   Hip extensors  5/5   Adductor 4-5  Adductor 5/5  Abductor 5/5  Abductor 5/5  Knee flexors  5/5   Knee flexors  5/5   Knee extensors  3/5   Knee extensors  5/5   Dorsiflexors  5/5   Dorsiflexors  5/5   Plantarflexors  5/5   Plantarflexors  5/5   Toe extensors  5/5   Toe extensors  5/5   Toe flexors  5/5   Toe flexors  5/5   Tone (Ashworth scale)  0  Tone (Ashworth scale)  0    MSRs:  Right                                                                 Left brachioradialis 2+  brachioradialis 2+  biceps 2+  biceps 2+  triceps 2+  triceps 2+  patellar 1+  patellar 2+  ankle jerk 0  ankle jerk 0   SENSORY: Vibration is reduced at the ankles bilaterally.Marland Kitchen  COORDINATION/GAIT:   Gait shows mild dragging of the right leg, slow and cautious.  Data: Labs 03/04/2018: Hemoglobin A1c 9.9, BUN 14, creatinine 0.85  MRI lumbar spine 02/17/2018:  1. Degenerative disc osteophyte with right greater than left facet hypertrophy at L5-S1 with resultant moderate bilateral L5 foraminal stenosis, slightly worse on the right. Posterior disc osteophyte also closely approximates the descending  S1 nerve roots  without frank impingement, greater on the right. 2. Shallow left foraminal/extraforaminal disc protrusion at L3-4, contacting and potentially irritating the exiting left L3 nerve root. 3. Additional mild noncompressive disc bulging at L2-3 through L4-5 without significant stenosis or neural impingement.  Labs 03/28/2018:  ESR 27, CRP 0.2, vitamin B12 309, copper 112, folate 17.8  IMPRESSION/PLAN: Right leg weakness with hip flexion, knee extension, and adduction most suggestive of L2-4 radiculopathy, however this is not seen on his imaging.  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  Repeat electrodiagnostic testing looking specifically for plexopathy has been declined, as patient did not tolerate this in the past.  I have stressed the importance of keeping blood sugars under control.  In the meantime, continue physical therapy and home exercises.Fall precautions discussed.  Return to clinic in 3 months  Thank you for allowing me to participate in patient's care.  If I can answer any additional questions, I would be pleased to do so.    Sincerely,    Nevan Creighton K. Posey Pronto, DO

## 2018-05-08 NOTE — Patient Instructions (Addendum)
Continue physical therapy  Return to clinic 3 months

## 2018-05-09 DIAGNOSIS — R531 Weakness: Secondary | ICD-10-CM | POA: Diagnosis not present

## 2018-05-09 DIAGNOSIS — R2681 Unsteadiness on feet: Secondary | ICD-10-CM | POA: Diagnosis not present

## 2018-05-09 DIAGNOSIS — R2689 Other abnormalities of gait and mobility: Secondary | ICD-10-CM | POA: Diagnosis not present

## 2018-05-09 DIAGNOSIS — M25561 Pain in right knee: Secondary | ICD-10-CM | POA: Diagnosis not present

## 2018-05-14 DIAGNOSIS — M25561 Pain in right knee: Secondary | ICD-10-CM | POA: Diagnosis not present

## 2018-05-14 DIAGNOSIS — R531 Weakness: Secondary | ICD-10-CM | POA: Diagnosis not present

## 2018-05-14 DIAGNOSIS — R2681 Unsteadiness on feet: Secondary | ICD-10-CM | POA: Diagnosis not present

## 2018-05-14 DIAGNOSIS — R2689 Other abnormalities of gait and mobility: Secondary | ICD-10-CM | POA: Diagnosis not present

## 2018-05-16 DIAGNOSIS — R2681 Unsteadiness on feet: Secondary | ICD-10-CM | POA: Diagnosis not present

## 2018-05-16 DIAGNOSIS — R2689 Other abnormalities of gait and mobility: Secondary | ICD-10-CM | POA: Diagnosis not present

## 2018-05-16 DIAGNOSIS — M25561 Pain in right knee: Secondary | ICD-10-CM | POA: Diagnosis not present

## 2018-05-16 DIAGNOSIS — R531 Weakness: Secondary | ICD-10-CM | POA: Diagnosis not present

## 2018-05-21 DIAGNOSIS — R2681 Unsteadiness on feet: Secondary | ICD-10-CM | POA: Diagnosis not present

## 2018-05-21 DIAGNOSIS — R531 Weakness: Secondary | ICD-10-CM | POA: Diagnosis not present

## 2018-05-21 DIAGNOSIS — M25561 Pain in right knee: Secondary | ICD-10-CM | POA: Diagnosis not present

## 2018-05-21 DIAGNOSIS — R2689 Other abnormalities of gait and mobility: Secondary | ICD-10-CM | POA: Diagnosis not present

## 2018-05-23 DIAGNOSIS — R531 Weakness: Secondary | ICD-10-CM | POA: Diagnosis not present

## 2018-05-23 DIAGNOSIS — M25561 Pain in right knee: Secondary | ICD-10-CM | POA: Diagnosis not present

## 2018-05-23 DIAGNOSIS — R2689 Other abnormalities of gait and mobility: Secondary | ICD-10-CM | POA: Diagnosis not present

## 2018-05-23 DIAGNOSIS — R2681 Unsteadiness on feet: Secondary | ICD-10-CM | POA: Diagnosis not present

## 2018-05-26 DIAGNOSIS — R2689 Other abnormalities of gait and mobility: Secondary | ICD-10-CM | POA: Diagnosis not present

## 2018-05-26 DIAGNOSIS — M25561 Pain in right knee: Secondary | ICD-10-CM | POA: Diagnosis not present

## 2018-05-26 DIAGNOSIS — R531 Weakness: Secondary | ICD-10-CM | POA: Diagnosis not present

## 2018-05-26 DIAGNOSIS — R2681 Unsteadiness on feet: Secondary | ICD-10-CM | POA: Diagnosis not present

## 2018-05-27 DIAGNOSIS — E113212 Type 2 diabetes mellitus with mild nonproliferative diabetic retinopathy with macular edema, left eye: Secondary | ICD-10-CM | POA: Diagnosis not present

## 2018-05-27 DIAGNOSIS — E113211 Type 2 diabetes mellitus with mild nonproliferative diabetic retinopathy with macular edema, right eye: Secondary | ICD-10-CM | POA: Diagnosis not present

## 2018-05-28 DIAGNOSIS — R531 Weakness: Secondary | ICD-10-CM | POA: Diagnosis not present

## 2018-05-28 DIAGNOSIS — M25561 Pain in right knee: Secondary | ICD-10-CM | POA: Diagnosis not present

## 2018-05-28 DIAGNOSIS — R2681 Unsteadiness on feet: Secondary | ICD-10-CM | POA: Diagnosis not present

## 2018-05-28 DIAGNOSIS — R2689 Other abnormalities of gait and mobility: Secondary | ICD-10-CM | POA: Diagnosis not present

## 2018-06-04 DIAGNOSIS — M25561 Pain in right knee: Secondary | ICD-10-CM | POA: Diagnosis not present

## 2018-06-04 DIAGNOSIS — R531 Weakness: Secondary | ICD-10-CM | POA: Diagnosis not present

## 2018-06-04 DIAGNOSIS — R2681 Unsteadiness on feet: Secondary | ICD-10-CM | POA: Diagnosis not present

## 2018-06-04 DIAGNOSIS — R2689 Other abnormalities of gait and mobility: Secondary | ICD-10-CM | POA: Diagnosis not present

## 2018-06-06 DIAGNOSIS — R2681 Unsteadiness on feet: Secondary | ICD-10-CM | POA: Diagnosis not present

## 2018-06-06 DIAGNOSIS — R2689 Other abnormalities of gait and mobility: Secondary | ICD-10-CM | POA: Diagnosis not present

## 2018-06-06 DIAGNOSIS — R531 Weakness: Secondary | ICD-10-CM | POA: Diagnosis not present

## 2018-06-06 DIAGNOSIS — M25561 Pain in right knee: Secondary | ICD-10-CM | POA: Diagnosis not present

## 2018-06-09 ENCOUNTER — Encounter

## 2018-06-09 ENCOUNTER — Ambulatory Visit: Payer: Medicare Other | Admitting: Neurology

## 2018-06-10 DIAGNOSIS — R05 Cough: Secondary | ICD-10-CM | POA: Diagnosis not present

## 2018-06-11 DIAGNOSIS — R531 Weakness: Secondary | ICD-10-CM | POA: Diagnosis not present

## 2018-06-11 DIAGNOSIS — M25561 Pain in right knee: Secondary | ICD-10-CM | POA: Diagnosis not present

## 2018-06-11 DIAGNOSIS — R2689 Other abnormalities of gait and mobility: Secondary | ICD-10-CM | POA: Diagnosis not present

## 2018-06-11 DIAGNOSIS — R2681 Unsteadiness on feet: Secondary | ICD-10-CM | POA: Diagnosis not present

## 2018-06-13 DIAGNOSIS — R2681 Unsteadiness on feet: Secondary | ICD-10-CM | POA: Diagnosis not present

## 2018-06-13 DIAGNOSIS — R531 Weakness: Secondary | ICD-10-CM | POA: Diagnosis not present

## 2018-06-13 DIAGNOSIS — M25561 Pain in right knee: Secondary | ICD-10-CM | POA: Diagnosis not present

## 2018-06-13 DIAGNOSIS — R2689 Other abnormalities of gait and mobility: Secondary | ICD-10-CM | POA: Diagnosis not present

## 2018-07-09 ENCOUNTER — Other Ambulatory Visit: Payer: Self-pay | Admitting: Internal Medicine

## 2018-07-09 ENCOUNTER — Ambulatory Visit
Admission: RE | Admit: 2018-07-09 | Discharge: 2018-07-09 | Disposition: A | Discharge status: Home / Self Care | Payer: Medicare Other | Source: Ambulatory Visit | Attending: Internal Medicine | Admitting: Internal Medicine

## 2018-07-09 DIAGNOSIS — R05 Cough: Secondary | ICD-10-CM | POA: Diagnosis not present

## 2018-07-09 DIAGNOSIS — I10 Essential (primary) hypertension: Secondary | ICD-10-CM | POA: Diagnosis not present

## 2018-07-09 DIAGNOSIS — E7800 Pure hypercholesterolemia, unspecified: Secondary | ICD-10-CM | POA: Diagnosis not present

## 2018-07-09 DIAGNOSIS — G629 Polyneuropathy, unspecified: Secondary | ICD-10-CM | POA: Diagnosis not present

## 2018-07-09 DIAGNOSIS — E08319 Diabetes mellitus due to underlying condition with unspecified diabetic retinopathy without macular edema: Secondary | ICD-10-CM | POA: Diagnosis not present

## 2018-07-09 DIAGNOSIS — R059 Cough, unspecified: Secondary | ICD-10-CM

## 2018-08-13 ENCOUNTER — Encounter: Payer: Self-pay | Admitting: Neurology

## 2018-08-16 NOTE — Progress Notes (Deleted)
**Note Andrew-Identified via Obfuscation** Follow-up Visit   Date: 08/16/18    TYRIN HERBERS MRN: [id number] DOB: 1946/03/10   Interim History: Andrew Duncan is a 73 y.o. right-handed African American male with poorly controlled diabetes mellitus (HbA1c 9.9), hyperlipidemia, hypertension returning to the clinic for follow-up of right leg weakness.  The patient was accompanied to the clinic by self.  History of present illness: Starting around late July/ealy August, he began having increased falls and right leg weakness.  He had suffered 4 falls because of right leg giving out and fortunately no injured himself. He has mild tingling over the medial thigh on the right.  He denies numbness/tingling of the lower legs.  He does not have severe low back pain. 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  No evidence of myopathy. He is also seeing Dr. French Ana for right knee pain and is scheduled to have MRI next week.  He is referred for further evaluation.    UPDATE 05/08/2018:  He is here for follow-up visit.  There has not been any significant change in his right leg weakness.  He just recently started physical therapy 2 weeks ago.  Fortunately, he has not suffered any falls.  He denies any pain or paresthesias.  UPDATE 08/16/2018:  ***  Medications:  Current Outpatient Medications on File Prior to Visit  Medication Sig Dispense Refill  . Albuterol Sulfate (PROAIR HFA IN) Inhale into the lungs.    Marland Kitchen aspirin EC 81 MG tablet Take 81 mg by mouth daily.    . BD PEN NEEDLE NANO U/F 32G X 4 MM MISC USE NEEDLES FOR INJECTION TWICE A DAY  3  . DIPHENHYDRAMINE HCL PO Take 10 mg by mouth.    Marland Kitchen glipiZIDE (GLUCOTROL XL) 10 MG 24 hr tablet Take 10 mg by mouth daily.  3  . JANUVIA 100 MG tablet Take 100 mg  by mouth daily.  3  . LANTUS SOLOSTAR 100 UNIT/ML Solostar Pen INJECT 43 UNITS ONCE A DAY SUBCUTANEOUSLY  4  . latanoprost (XALATAN) 0.005 % ophthalmic solution APPLY 1 DROP IN BOTH EYES EVERY NIGHT  3  . losartan (COZAAR) 25 MG tablet     . metFORMIN (GLUCOPHAGE) 500 MG tablet     . pravastatin (PRAVACHOL) 20 MG tablet Take 20 mg by mouth daily.  1  . sildenafil (VIAGRA) 100 MG tablet Take 100 mg by mouth daily as needed for erectile dysfunction.    . timolol (TIMOPTIC) 0.5 % ophthalmic solution INSTILL 1 DROP INTO BOTH EYES TWICE A DAY  3   No current facility-administered medications on file prior to visit.     Allergies: No Known Allergies  Review of Systems:  CONSTITUTIONAL: No fevers, chills, night sweats, or weight loss.  EYES: No visual changes or eye pain ENT: No hearing changes.  No history of nose bleeds.   RESPIRATORY: No cough, wheezing and shortness of breath.   CARDIOVASCULAR: Negative for chest pain, and palpitations.   GI: Negative for abdominal discomfort, blood in stools or black stools.  No recent change in bowel habits.   GU:  No history of incontinence.   MUSCLOSKELETAL: No history of joint pain or swelling.  No myalgias.   SKIN: Negative for lesions, rash, and itching.   ENDOCRINE: Negative for cold or  heat intolerance, polydipsia or goiter.   PSYCH:  No depression or anxiety symptoms.   NEURO: As Above.    Vital Signs:  There were no vitals taken for this visit.   General Medical Exam:   General:  Well appearing, comfortable  Eyes/ENT: see cranial nerve examination.   Neck:   No carotid bruits. Respiratory:  Clear to auscultation, good air entry bilaterally.   Cardiac:  Regular rate and rhythm, no murmur.   Ext:  No edema  Neurological Exam: MENTAL STATUS including orientation to time, place, person, recent and remote memory, attention span and concentration, language, and fund of knowledge is normal.  Speech is not dysarthric.  CRANIAL NERVES:   Face is symmetric.   MOTOR:  No atrophy, fasciculations or abnormal movements.  No pronator drift.  Tone is normal.    Right Upper Extremity:    Left Upper Extremity:    Deltoid  5/5   Deltoid  5/5   Biceps  5/5   Biceps  5/5   Triceps  5/5   Triceps  5/5   Wrist extensors  5/5   Wrist extensors  5/5   Wrist flexors  5/5   Wrist flexors  5/5   Finger extensors  5/5   Finger extensors  5/5   Finger flexors  5/5   Finger flexors  5/5   Dorsal interossei  5/5   Dorsal interossei  5/5   Abductor pollicis  5/5   Abductor pollicis  5/5   Tone (Ashworth scale)  0  Tone (Ashworth scale)  0   Right Lower Extremity:    Left Lower Extremity:    Hip flexors  4-/5   Hip flexors  5/5   Hip extensors  5/5   Hip extensors  5/5   Adductor 4-5  Adductor 5/5  Abductor 5/5  Abductor 5/5  Knee flexors  5/5   Knee flexors  5/5   Knee extensors  3/5   Knee extensors  5/5   Dorsiflexors  5/5   Dorsiflexors  5/5   Plantarflexors  5/5   Plantarflexors  5/5   Toe extensors  5/5   Toe extensors  5/5   Toe flexors  5/5   Toe flexors  5/5   Tone (Ashworth scale)  0  Tone (Ashworth scale)  0    MSRs:  Right                                                                 Left brachioradialis 2+  brachioradialis 2+  biceps 2+  biceps 2+  triceps 2+  triceps 2+  patellar 1+  patellar 2+  ankle jerk 0  ankle jerk 0   SENSORY: Vibration is reduced at the ankles bilaterally.Marland Kitchen  COORDINATION/GAIT:   Gait shows mild dragging of the right leg, slow and cautious.  Data: Labs 03/04/2018: Hemoglobin A1c 9.9, BUN 14, creatinine 0.85  MRI lumbar spine 02/17/2018:  1. Degenerative disc osteophyte with right greater than left facet hypertrophy at L5-S1 with resultant moderate bilateral L5 foraminal stenosis, slightly worse on the right. Posterior disc osteophyte also closely approximates the descending S1 nerve roots without frank impingement, greater on the right. 2. Shallow left foraminal/extraforaminal disc  protrusion at L3-4, contacting and potentially irritating the exiting  left L3 nerve root. 3. Additional mild noncompressive disc bulging at L2-3 through L4-5 without significant stenosis or neural impingement.  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  MRI lumbar spine does not show nerve impingement at L2-4 levels, excluding structural pathology.  He did not want to pursue repeat EDX, especially has weakness is improving.  Clinically, he ***.   Continue supportive care with PT and strict diabetes management  Return to clinic in 6 months ***  Thank you for allowing me to participate in patient's care.  If I can answer any additional questions, I would be pleased to do so.    Sincerely,    Donika K. Posey Pronto, DO

## 2018-08-20 ENCOUNTER — Ambulatory Visit: Payer: Medicare Other | Admitting: Neurology

## 2018-08-22 ENCOUNTER — Ambulatory Visit: Payer: Medicare Other | Admitting: Neurology

## 2018-08-25 DIAGNOSIS — H40013 Open angle with borderline findings, low risk, bilateral: Secondary | ICD-10-CM | POA: Diagnosis not present

## 2018-08-26 DIAGNOSIS — R509 Fever, unspecified: Secondary | ICD-10-CM | POA: Diagnosis not present

## 2018-08-26 DIAGNOSIS — J101 Influenza due to other identified influenza virus with other respiratory manifestations: Secondary | ICD-10-CM | POA: Diagnosis not present

## 2018-08-26 DIAGNOSIS — R6889 Other general symptoms and signs: Secondary | ICD-10-CM | POA: Diagnosis not present

## 2018-11-11 DIAGNOSIS — E11319 Type 2 diabetes mellitus with unspecified diabetic retinopathy without macular edema: Secondary | ICD-10-CM | POA: Diagnosis not present

## 2018-11-11 DIAGNOSIS — E7800 Pure hypercholesterolemia, unspecified: Secondary | ICD-10-CM | POA: Diagnosis not present

## 2018-11-11 DIAGNOSIS — I10 Essential (primary) hypertension: Secondary | ICD-10-CM | POA: Diagnosis not present

## 2018-11-11 DIAGNOSIS — E08319 Diabetes mellitus due to underlying condition with unspecified diabetic retinopathy without macular edema: Secondary | ICD-10-CM | POA: Diagnosis not present

## 2018-11-14 DIAGNOSIS — E7800 Pure hypercholesterolemia, unspecified: Secondary | ICD-10-CM | POA: Diagnosis not present

## 2018-11-14 DIAGNOSIS — E11319 Type 2 diabetes mellitus with unspecified diabetic retinopathy without macular edema: Secondary | ICD-10-CM | POA: Diagnosis not present

## 2018-11-14 DIAGNOSIS — E08319 Diabetes mellitus due to underlying condition with unspecified diabetic retinopathy without macular edema: Secondary | ICD-10-CM | POA: Diagnosis not present

## 2018-11-14 DIAGNOSIS — I10 Essential (primary) hypertension: Secondary | ICD-10-CM | POA: Diagnosis not present

## 2019-01-06 DIAGNOSIS — E113313 Type 2 diabetes mellitus with moderate nonproliferative diabetic retinopathy with macular edema, bilateral: Secondary | ICD-10-CM | POA: Diagnosis not present

## 2019-01-06 DIAGNOSIS — Z961 Presence of intraocular lens: Secondary | ICD-10-CM | POA: Diagnosis not present

## 2019-01-06 DIAGNOSIS — H52203 Unspecified astigmatism, bilateral: Secondary | ICD-10-CM | POA: Diagnosis not present

## 2019-01-06 DIAGNOSIS — H40013 Open angle with borderline findings, low risk, bilateral: Secondary | ICD-10-CM | POA: Diagnosis not present

## 2019-03-10 DIAGNOSIS — Z23 Encounter for immunization: Secondary | ICD-10-CM | POA: Diagnosis not present

## 2019-03-26 DIAGNOSIS — I10 Essential (primary) hypertension: Secondary | ICD-10-CM | POA: Diagnosis not present

## 2019-03-26 DIAGNOSIS — Z0000 Encounter for general adult medical examination without abnormal findings: Secondary | ICD-10-CM | POA: Diagnosis not present

## 2019-03-26 DIAGNOSIS — E11319 Type 2 diabetes mellitus with unspecified diabetic retinopathy without macular edema: Secondary | ICD-10-CM | POA: Diagnosis not present

## 2019-03-26 DIAGNOSIS — G629 Polyneuropathy, unspecified: Secondary | ICD-10-CM | POA: Diagnosis not present

## 2019-03-26 DIAGNOSIS — Z1389 Encounter for screening for other disorder: Secondary | ICD-10-CM | POA: Diagnosis not present

## 2019-03-26 DIAGNOSIS — Z125 Encounter for screening for malignant neoplasm of prostate: Secondary | ICD-10-CM | POA: Diagnosis not present

## 2019-03-26 DIAGNOSIS — E7800 Pure hypercholesterolemia, unspecified: Secondary | ICD-10-CM | POA: Diagnosis not present

## 2019-03-26 DIAGNOSIS — E1165 Type 2 diabetes mellitus with hyperglycemia: Secondary | ICD-10-CM | POA: Diagnosis not present

## 2019-03-26 DIAGNOSIS — E08319 Diabetes mellitus due to underlying condition with unspecified diabetic retinopathy without macular edema: Secondary | ICD-10-CM | POA: Diagnosis not present

## 2019-04-20 DIAGNOSIS — E11319 Type 2 diabetes mellitus with unspecified diabetic retinopathy without macular edema: Secondary | ICD-10-CM | POA: Diagnosis not present

## 2019-04-20 DIAGNOSIS — E785 Hyperlipidemia, unspecified: Secondary | ICD-10-CM | POA: Diagnosis not present

## 2019-04-20 DIAGNOSIS — E7800 Pure hypercholesterolemia, unspecified: Secondary | ICD-10-CM | POA: Diagnosis not present

## 2019-04-20 DIAGNOSIS — I10 Essential (primary) hypertension: Secondary | ICD-10-CM | POA: Diagnosis not present

## 2019-04-20 DIAGNOSIS — E08319 Diabetes mellitus due to underlying condition with unspecified diabetic retinopathy without macular edema: Secondary | ICD-10-CM | POA: Diagnosis not present

## 2019-06-08 DIAGNOSIS — E11319 Type 2 diabetes mellitus with unspecified diabetic retinopathy without macular edema: Secondary | ICD-10-CM | POA: Diagnosis not present

## 2019-06-08 DIAGNOSIS — I10 Essential (primary) hypertension: Secondary | ICD-10-CM | POA: Diagnosis not present

## 2019-06-08 DIAGNOSIS — E785 Hyperlipidemia, unspecified: Secondary | ICD-10-CM | POA: Diagnosis not present

## 2019-06-08 DIAGNOSIS — E7800 Pure hypercholesterolemia, unspecified: Secondary | ICD-10-CM | POA: Diagnosis not present

## 2019-06-08 DIAGNOSIS — E08319 Diabetes mellitus due to underlying condition with unspecified diabetic retinopathy without macular edema: Secondary | ICD-10-CM | POA: Diagnosis not present

## 2019-09-24 DIAGNOSIS — G629 Polyneuropathy, unspecified: Secondary | ICD-10-CM | POA: Diagnosis not present

## 2019-09-24 DIAGNOSIS — I10 Essential (primary) hypertension: Secondary | ICD-10-CM | POA: Diagnosis not present

## 2019-09-24 DIAGNOSIS — E7800 Pure hypercholesterolemia, unspecified: Secondary | ICD-10-CM | POA: Diagnosis not present

## 2019-09-24 DIAGNOSIS — E08319 Diabetes mellitus due to underlying condition with unspecified diabetic retinopathy without macular edema: Secondary | ICD-10-CM | POA: Diagnosis not present

## 2019-11-06 DIAGNOSIS — H40023 Open angle with borderline findings, high risk, bilateral: Secondary | ICD-10-CM | POA: Diagnosis not present

## 2020-03-02 DIAGNOSIS — Z23 Encounter for immunization: Secondary | ICD-10-CM | POA: Diagnosis not present

## 2020-03-16 DIAGNOSIS — H40023 Open angle with borderline findings, high risk, bilateral: Secondary | ICD-10-CM | POA: Diagnosis not present

## 2020-03-16 DIAGNOSIS — H524 Presbyopia: Secondary | ICD-10-CM | POA: Diagnosis not present

## 2020-03-16 DIAGNOSIS — H04123 Dry eye syndrome of bilateral lacrimal glands: Secondary | ICD-10-CM | POA: Diagnosis not present

## 2020-03-16 DIAGNOSIS — E113313 Type 2 diabetes mellitus with moderate nonproliferative diabetic retinopathy with macular edema, bilateral: Secondary | ICD-10-CM | POA: Diagnosis not present

## 2020-03-30 DIAGNOSIS — E113312 Type 2 diabetes mellitus with moderate nonproliferative diabetic retinopathy with macular edema, left eye: Secondary | ICD-10-CM | POA: Diagnosis not present

## 2020-03-30 DIAGNOSIS — E113413 Type 2 diabetes mellitus with severe nonproliferative diabetic retinopathy with macular edema, bilateral: Secondary | ICD-10-CM | POA: Diagnosis not present

## 2020-03-30 DIAGNOSIS — H3582 Retinal ischemia: Secondary | ICD-10-CM | POA: Diagnosis not present

## 2020-03-30 DIAGNOSIS — H35033 Hypertensive retinopathy, bilateral: Secondary | ICD-10-CM | POA: Diagnosis not present

## 2020-03-30 DIAGNOSIS — Z961 Presence of intraocular lens: Secondary | ICD-10-CM | POA: Diagnosis not present

## 2020-03-30 DIAGNOSIS — H3563 Retinal hemorrhage, bilateral: Secondary | ICD-10-CM | POA: Diagnosis not present

## 2020-04-06 DIAGNOSIS — E113311 Type 2 diabetes mellitus with moderate nonproliferative diabetic retinopathy with macular edema, right eye: Secondary | ICD-10-CM | POA: Diagnosis not present

## 2020-04-08 DIAGNOSIS — Z1389 Encounter for screening for other disorder: Secondary | ICD-10-CM | POA: Diagnosis not present

## 2020-04-08 DIAGNOSIS — Z794 Long term (current) use of insulin: Secondary | ICD-10-CM | POA: Diagnosis not present

## 2020-04-08 DIAGNOSIS — I10 Essential (primary) hypertension: Secondary | ICD-10-CM | POA: Diagnosis not present

## 2020-04-08 DIAGNOSIS — E1165 Type 2 diabetes mellitus with hyperglycemia: Secondary | ICD-10-CM | POA: Diagnosis not present

## 2020-04-08 DIAGNOSIS — E11319 Type 2 diabetes mellitus with unspecified diabetic retinopathy without macular edema: Secondary | ICD-10-CM | POA: Diagnosis not present

## 2020-04-08 DIAGNOSIS — Z0000 Encounter for general adult medical examination without abnormal findings: Secondary | ICD-10-CM | POA: Diagnosis not present

## 2020-04-08 DIAGNOSIS — E1142 Type 2 diabetes mellitus with diabetic polyneuropathy: Secondary | ICD-10-CM | POA: Diagnosis not present

## 2020-04-08 DIAGNOSIS — E7800 Pure hypercholesterolemia, unspecified: Secondary | ICD-10-CM | POA: Diagnosis not present

## 2020-04-13 DIAGNOSIS — E113312 Type 2 diabetes mellitus with moderate nonproliferative diabetic retinopathy with macular edema, left eye: Secondary | ICD-10-CM | POA: Diagnosis not present

## 2020-04-18 DIAGNOSIS — Z23 Encounter for immunization: Secondary | ICD-10-CM | POA: Diagnosis not present

## 2020-05-11 DIAGNOSIS — E113311 Type 2 diabetes mellitus with moderate nonproliferative diabetic retinopathy with macular edema, right eye: Secondary | ICD-10-CM | POA: Diagnosis not present

## 2020-05-18 DIAGNOSIS — E113312 Type 2 diabetes mellitus with moderate nonproliferative diabetic retinopathy with macular edema, left eye: Secondary | ICD-10-CM | POA: Diagnosis not present

## 2020-06-08 DIAGNOSIS — H3582 Retinal ischemia: Secondary | ICD-10-CM | POA: Diagnosis not present

## 2020-06-08 DIAGNOSIS — H35033 Hypertensive retinopathy, bilateral: Secondary | ICD-10-CM | POA: Diagnosis not present

## 2020-06-08 DIAGNOSIS — E113413 Type 2 diabetes mellitus with severe nonproliferative diabetic retinopathy with macular edema, bilateral: Secondary | ICD-10-CM | POA: Diagnosis not present

## 2020-06-08 DIAGNOSIS — Z961 Presence of intraocular lens: Secondary | ICD-10-CM | POA: Diagnosis not present

## 2020-06-15 DIAGNOSIS — E113311 Type 2 diabetes mellitus with moderate nonproliferative diabetic retinopathy with macular edema, right eye: Secondary | ICD-10-CM | POA: Diagnosis not present

## 2020-06-27 DIAGNOSIS — E113312 Type 2 diabetes mellitus with moderate nonproliferative diabetic retinopathy with macular edema, left eye: Secondary | ICD-10-CM | POA: Diagnosis not present

## 2020-07-15 DIAGNOSIS — E113312 Type 2 diabetes mellitus with moderate nonproliferative diabetic retinopathy with macular edema, left eye: Secondary | ICD-10-CM | POA: Diagnosis not present

## 2020-07-16 ENCOUNTER — Encounter: Payer: Self-pay | Admitting: Emergency Medicine

## 2020-07-16 ENCOUNTER — Other Ambulatory Visit: Payer: Self-pay

## 2020-07-16 ENCOUNTER — Ambulatory Visit
Admission: EM | Admit: 2020-07-16 | Discharge: 2020-07-16 | Disposition: A | Discharge status: Home / Self Care | Payer: Medicare Other | Attending: Emergency Medicine | Admitting: Emergency Medicine

## 2020-07-16 DIAGNOSIS — Z1152 Encounter for screening for COVID-19: Secondary | ICD-10-CM | POA: Diagnosis not present

## 2020-07-16 DIAGNOSIS — J069 Acute upper respiratory infection, unspecified: Secondary | ICD-10-CM

## 2020-07-16 MED ORDER — BENZONATATE 100 MG PO CAPS: 100.0000 mg | ORAL_CAPSULE | Freq: Three times a day (TID) | ORAL | 0 refills | Status: DC

## 2020-07-16 NOTE — ED Triage Notes (Signed)
Pt said he has been having a tingling nagging cough x 3 days now. nO other symptoms

## 2020-07-16 NOTE — ED Provider Notes (Signed)
[facility]    CSN: [id number] Arrival date & time: 07/16/20  1449      History   Chief Complaint Chief Complaint  Patient presents with  . Cough    HPI Andrew Duncan is a 75 y.o. male  With history as below presenting for mild, dry cough x3 days.  No chest pain, difficulty breathing, fever.  Is COVID vaccinated: Requesting testing as his wife is concerned that he could have it.  No known exposures.  Past Medical History:  Diagnosis Date  . Anemia   . Childhood asthma   . Diabetes (HCC)   . Essential hypertension   . Hypercholesterolemia   . Hyperlipidemia   . Polyneuropathy   . Retinopathy     There are no problems to display for this patient.   Past Surgical History:  Procedure Laterality Date  . ANAL FISSURE REPAIR    . left elbow fracture pinning     pinning       Home Medications    Prior to Admission medications   Medication Sig Start Date End Date Taking? Authorizing Provider  benzonatate (TESSALON) 100 MG capsule Take 1 capsule (100 mg total) by mouth every 8 (eight) hours. 07/16/20  Yes Hall-Potvin, [place], PA-C  Albuterol Sulfate (PROAIR HFA IN) Inhale into the lungs.    [provider]  aspirin EC 81 MG tablet Take 81 mg by mouth daily.    [provider]  BD PEN NEEDLE NANO U/F 32G X 4 MM MISC USE NEEDLES FOR INJECTION TWICE A DAY 01/30/18   [provider]  DIPHENHYDRAMINE HCL PO Take 10 mg by mouth.    [provider]  glipiZIDE (GLUCOTROL XL) 10 MG 24 hr tablet Take 10 mg by mouth daily. 02/28/18   [provider]  JANUVIA 100 MG tablet Take 100 mg by mouth daily. 02/24/18   [provider]  LANTUS SOLOSTAR 100 UNIT/ML Solostar Pen INJECT 43 UNITS ONCE A DAY SUBCUTANEOUSLY 02/14/18   [provider]  latanoprost (XALATAN) 0.005 % ophthalmic solution APPLY 1 DROP IN BOTH EYES EVERY NIGHT 01/31/18   [provider]  losartan (COZAAR) 25 MG tablet  03/21/18   [provider]  metFORMIN (GLUCOPHAGE) 500 MG tablet  03/17/18   [provider]  pravastatin (PRAVACHOL) 20 MG tablet Take 20 mg by mouth daily. 02/03/18   [provider]  sildenafil (VIAGRA) 100 MG tablet Take 100 mg by mouth daily as needed for erectile dysfunction.    [provider]  timolol (TIMOPTIC) 0.5 % ophthalmic solution INSTILL 1 DROP INTO BOTH EYES TWICE A DAY 01/07/18   [provider]    Family History Family History  Problem Relation Age of Onset  . Diabetes Mother   . Stroke Mother     Social History Social History   Tobacco Use  . Smoking status: Never Smoker  . Smokeless tobacco: Never Used  Vaping Use  . Vaping Use: Never used  Substance Use Topics  . Alcohol use: Yes    Comment: occasionally  . Drug use: Never     Allergies   Patient has no known allergies.   Review of Systems Review of Systems  Constitutional: Negative for fatigue and fever.  HENT: Negative for congestion, dental problem, ear pain, facial swelling, hearing loss, sinus pain, sore throat, trouble swallowing and voice change.   Eyes: Negative for photophobia, pain and visual disturbance.  Respiratory: Positive for cough. Negative for chest tightness  and shortness of breath.   Cardiovascular: Negative for chest pain and palpitations.  Gastrointestinal: Negative for diarrhea and vomiting.  Musculoskeletal: Negative for arthralgias and myalgias.  Neurological: Negative for dizziness and headaches.     Physical Exam Triage Vital Signs ED Triage Vitals  Enc Vitals Group     BP 07/16/20 1541 (!) 146/77     Pulse Rate 07/16/20 1541 (!) 102     Resp 07/16/20 1541 16     Temp 07/16/20 1541 98.6 F (37 C)     Temp Source 07/16/20 1541 Oral     SpO2 07/16/20 1541 95 %     Weight --      Height --      Head Circumference --      Peak Flow --      Pain Score 07/16/20 1458 0     Pain Loc --      Pain Edu? --      Excl. in [place]? --    No data  found.  Updated Vital Signs BP (!) 146/77 (BP Location: Right Arm)   Pulse (!) 102   Temp 98.6 F (37 C) (Oral)   Resp 16   SpO2 95%   Visual Acuity Right Eye Distance:   Left Eye Distance:   Bilateral Distance:    Right Eye Near:   Left Eye Near:    Bilateral Near:     Physical Exam Constitutional:      General: He is not in acute distress.    Appearance: He is not toxic-appearing or diaphoretic.  HENT:     Head: Normocephalic and atraumatic.     Right Ear: Tympanic membrane and ear canal normal.     Left Ear: Tympanic membrane and ear canal normal.     Mouth/Throat:     Mouth: Mucous membranes are moist.     Pharynx: Oropharynx is clear.  Eyes:     General: No scleral icterus.    Conjunctiva/sclera: Conjunctivae normal.     Pupils: Pupils are equal, round, and reactive to light.  Neck:     Comments: Trachea midline, negative JVD Cardiovascular:     Rate and Rhythm: Normal rate and regular rhythm.  Pulmonary:     Effort: Pulmonary effort is normal. No respiratory distress.     Breath sounds: No wheezing.  Musculoskeletal:     Cervical back: Neck supple. No tenderness.  Lymphadenopathy:     Cervical: No cervical adenopathy.  Skin:    Capillary Refill: Capillary refill takes less than 2 seconds.     Coloration: Skin is not jaundiced or pale.     Findings: No rash.  Neurological:     Mental Status: He is alert and oriented to person, place, and time.      UC Treatments / Results  Labs (all labs ordered are listed, but only abnormal results are displayed) Labs Reviewed  NOVEL CORONAVIRUS, NAA    EKG   Radiology No results found.  Procedures Procedures (including critical care time)  Medications Ordered in UC Medications - No data to display  Initial Impression / Assessment and Plan / UC Course  I have reviewed the triage vital signs and the nursing notes.  Pertinent labs & imaging results that were available during my care of the patient were  reviewed by me and considered in my medical decision making (see chart for details).     Patient afebrile, nontoxic, with SpO2 95%.  Covid PCR pending.  Patient to quarantine until  results are back.  We will treat supportively as outlined below.  Return precautions discussed, patient verbalized understanding and is agreeable to plan. Final Clinical Impressions(s) / UC Diagnoses   Final diagnoses:  Encounter for screening for COVID-19  URI with cough and congestion     Discharge Instructions     Tessalon for cough. Start flonase, atrovent nasal spray for nasal congestion/drainage. You can use over the counter nasal saline rinse such as neti pot for nasal congestion. Keep hydrated, your urine should be clear to pale yellow in color. Tylenol/motrin for fever and pain. Monitor for any worsening of symptoms, chest pain, shortness of breath, wheezing, swelling of the throat, go to the emergency department for further evaluation needed.     ED Prescriptions    Medication Sig Dispense Auth. Provider   benzonatate (TESSALON) 100 MG capsule Take 1 capsule (100 mg total) by mouth every 8 (eight) hours. 21 capsule Hall-Potvin, [place], PA-C     PDMP not reviewed this encounter.   Hall-Potvin, [place], [place] 07/16/20 1636

## 2020-07-16 NOTE — Discharge Instructions (Signed)

## 2020-07-19 LAB — NOVEL CORONAVIRUS, NAA: SARS-CoV-2, NAA: DETECTED — AB

## 2020-07-20 ENCOUNTER — Telehealth: Payer: Self-pay

## 2020-07-20 NOTE — Telephone Encounter (Signed)
Called to discuss with patient about COVID-19 symptoms and the use of one of the available treatments for those with mild to moderate Covid symptoms and at a high risk of hospitalization.  Pt appears to qualify for outpatient treatment due to co-morbid conditions and/or a member of an at-risk group in accordance with the FDA Emergency Use Authorization.    Symptom onset: 07/13/20 per chart Vaccinated: Yes Booster? Unknown Immunocompromised? No Qualifiers: HTN  Unable to reach pt - Pt. Is out of the 7 day window for treatments.   Esther Hardy

## 2020-07-25 DIAGNOSIS — E113311 Type 2 diabetes mellitus with moderate nonproliferative diabetic retinopathy with macular edema, right eye: Secondary | ICD-10-CM | POA: Diagnosis not present

## 2020-08-26 DIAGNOSIS — E113312 Type 2 diabetes mellitus with moderate nonproliferative diabetic retinopathy with macular edema, left eye: Secondary | ICD-10-CM | POA: Diagnosis not present

## 2020-09-06 DIAGNOSIS — E113311 Type 2 diabetes mellitus with moderate nonproliferative diabetic retinopathy with macular edema, right eye: Secondary | ICD-10-CM | POA: Diagnosis not present

## 2020-09-30 DIAGNOSIS — E113312 Type 2 diabetes mellitus with moderate nonproliferative diabetic retinopathy with macular edema, left eye: Secondary | ICD-10-CM | POA: Diagnosis not present

## 2020-10-07 DIAGNOSIS — E7800 Pure hypercholesterolemia, unspecified: Secondary | ICD-10-CM | POA: Diagnosis not present

## 2020-10-07 DIAGNOSIS — E1165 Type 2 diabetes mellitus with hyperglycemia: Secondary | ICD-10-CM | POA: Diagnosis not present

## 2020-10-07 DIAGNOSIS — E113492 Type 2 diabetes mellitus with severe nonproliferative diabetic retinopathy without macular edema, left eye: Secondary | ICD-10-CM | POA: Diagnosis not present

## 2020-10-07 DIAGNOSIS — E1142 Type 2 diabetes mellitus with diabetic polyneuropathy: Secondary | ICD-10-CM | POA: Diagnosis not present

## 2020-10-07 DIAGNOSIS — Z794 Long term (current) use of insulin: Secondary | ICD-10-CM | POA: Diagnosis not present

## 2020-10-07 DIAGNOSIS — I10 Essential (primary) hypertension: Secondary | ICD-10-CM | POA: Diagnosis not present

## 2020-10-14 DIAGNOSIS — E113311 Type 2 diabetes mellitus with moderate nonproliferative diabetic retinopathy with macular edema, right eye: Secondary | ICD-10-CM | POA: Diagnosis not present

## 2020-11-15 DIAGNOSIS — E113312 Type 2 diabetes mellitus with moderate nonproliferative diabetic retinopathy with macular edema, left eye: Secondary | ICD-10-CM | POA: Diagnosis not present

## 2020-11-25 DIAGNOSIS — E113311 Type 2 diabetes mellitus with moderate nonproliferative diabetic retinopathy with macular edema, right eye: Secondary | ICD-10-CM | POA: Diagnosis not present

## 2021-01-09 DIAGNOSIS — H35033 Hypertensive retinopathy, bilateral: Secondary | ICD-10-CM | POA: Diagnosis not present

## 2021-01-09 DIAGNOSIS — H3582 Retinal ischemia: Secondary | ICD-10-CM | POA: Diagnosis not present

## 2021-01-09 DIAGNOSIS — E113211 Type 2 diabetes mellitus with mild nonproliferative diabetic retinopathy with macular edema, right eye: Secondary | ICD-10-CM | POA: Diagnosis not present

## 2021-01-09 DIAGNOSIS — Z961 Presence of intraocular lens: Secondary | ICD-10-CM | POA: Diagnosis not present

## 2021-01-09 DIAGNOSIS — E113312 Type 2 diabetes mellitus with moderate nonproliferative diabetic retinopathy with macular edema, left eye: Secondary | ICD-10-CM | POA: Diagnosis not present

## 2021-01-13 DIAGNOSIS — K136 Irritative hyperplasia of oral mucosa: Secondary | ICD-10-CM | POA: Diagnosis not present

## 2021-01-18 DIAGNOSIS — E113312 Type 2 diabetes mellitus with moderate nonproliferative diabetic retinopathy with macular edema, left eye: Secondary | ICD-10-CM | POA: Diagnosis not present

## 2021-01-25 DIAGNOSIS — H40023 Open angle with borderline findings, high risk, bilateral: Secondary | ICD-10-CM | POA: Diagnosis not present

## 2021-01-26 DIAGNOSIS — E113311 Type 2 diabetes mellitus with moderate nonproliferative diabetic retinopathy with macular edema, right eye: Secondary | ICD-10-CM | POA: Diagnosis not present

## 2021-02-07 DIAGNOSIS — Z23 Encounter for immunization: Secondary | ICD-10-CM | POA: Diagnosis not present

## 2021-03-13 DIAGNOSIS — E113312 Type 2 diabetes mellitus with moderate nonproliferative diabetic retinopathy with macular edema, left eye: Secondary | ICD-10-CM | POA: Diagnosis not present

## 2021-03-22 DIAGNOSIS — E113311 Type 2 diabetes mellitus with moderate nonproliferative diabetic retinopathy with macular edema, right eye: Secondary | ICD-10-CM | POA: Diagnosis not present

## 2021-03-27 DIAGNOSIS — Z23 Encounter for immunization: Secondary | ICD-10-CM | POA: Diagnosis not present

## 2021-04-21 DIAGNOSIS — Z0000 Encounter for general adult medical examination without abnormal findings: Secondary | ICD-10-CM | POA: Diagnosis not present

## 2021-04-21 DIAGNOSIS — I10 Essential (primary) hypertension: Secondary | ICD-10-CM | POA: Diagnosis not present

## 2021-04-21 DIAGNOSIS — E113492 Type 2 diabetes mellitus with severe nonproliferative diabetic retinopathy without macular edema, left eye: Secondary | ICD-10-CM | POA: Diagnosis not present

## 2021-04-21 DIAGNOSIS — Z7984 Long term (current) use of oral hypoglycemic drugs: Secondary | ICD-10-CM | POA: Diagnosis not present

## 2021-04-21 DIAGNOSIS — E663 Overweight: Secondary | ICD-10-CM | POA: Diagnosis not present

## 2021-04-21 DIAGNOSIS — Z5181 Encounter for therapeutic drug level monitoring: Secondary | ICD-10-CM | POA: Diagnosis not present

## 2021-04-21 DIAGNOSIS — Z79899 Other long term (current) drug therapy: Secondary | ICD-10-CM | POA: Diagnosis not present

## 2021-04-21 DIAGNOSIS — E7800 Pure hypercholesterolemia, unspecified: Secondary | ICD-10-CM | POA: Diagnosis not present

## 2021-04-21 DIAGNOSIS — G629 Polyneuropathy, unspecified: Secondary | ICD-10-CM | POA: Diagnosis not present

## 2021-04-21 DIAGNOSIS — Z794 Long term (current) use of insulin: Secondary | ICD-10-CM | POA: Diagnosis not present

## 2021-05-03 DIAGNOSIS — E113312 Type 2 diabetes mellitus with moderate nonproliferative diabetic retinopathy with macular edema, left eye: Secondary | ICD-10-CM | POA: Diagnosis not present

## 2021-05-27 ENCOUNTER — Emergency Department (HOSPITAL_BASED_OUTPATIENT_CLINIC_OR_DEPARTMENT_OTHER): Payer: Medicare Other

## 2021-05-27 ENCOUNTER — Emergency Department (HOSPITAL_BASED_OUTPATIENT_CLINIC_OR_DEPARTMENT_OTHER)
Admission: EM | Admit: 2021-05-27 | Discharge: 2021-05-27 | Disposition: A | Discharge status: Home / Self Care | Payer: Medicare Other | Attending: Emergency Medicine | Admitting: Emergency Medicine

## 2021-05-27 ENCOUNTER — Encounter: Payer: Self-pay | Admitting: *Deleted

## 2021-05-27 ENCOUNTER — Emergency Department (HOSPITAL_BASED_OUTPATIENT_CLINIC_OR_DEPARTMENT_OTHER): Payer: Medicare Other | Admitting: Radiology

## 2021-05-27 ENCOUNTER — Encounter (HOSPITAL_BASED_OUTPATIENT_CLINIC_OR_DEPARTMENT_OTHER): Payer: Self-pay | Admitting: Emergency Medicine

## 2021-05-27 ENCOUNTER — Other Ambulatory Visit: Payer: Self-pay

## 2021-05-27 ENCOUNTER — Ambulatory Visit
Admission: EM | Admit: 2021-05-27 | Discharge: 2021-05-27 | Disposition: A | Discharge status: Home / Self Care | Payer: Medicare Other | Attending: Physician Assistant | Admitting: Physician Assistant

## 2021-05-27 DIAGNOSIS — D72829 Elevated white blood cell count, unspecified: Secondary | ICD-10-CM | POA: Diagnosis not present

## 2021-05-27 DIAGNOSIS — R42 Dizziness and giddiness: Secondary | ICD-10-CM

## 2021-05-27 DIAGNOSIS — Z7982 Long term (current) use of aspirin: Secondary | ICD-10-CM | POA: Insufficient documentation

## 2021-05-27 DIAGNOSIS — E11319 Type 2 diabetes mellitus with unspecified diabetic retinopathy without macular edema: Secondary | ICD-10-CM | POA: Diagnosis not present

## 2021-05-27 DIAGNOSIS — E1165 Type 2 diabetes mellitus with hyperglycemia: Secondary | ICD-10-CM | POA: Diagnosis not present

## 2021-05-27 DIAGNOSIS — Z20822 Contact with and (suspected) exposure to covid-19: Secondary | ICD-10-CM | POA: Insufficient documentation

## 2021-05-27 DIAGNOSIS — R739 Hyperglycemia, unspecified: Secondary | ICD-10-CM

## 2021-05-27 DIAGNOSIS — Z7984 Long term (current) use of oral hypoglycemic drugs: Secondary | ICD-10-CM | POA: Diagnosis not present

## 2021-05-27 DIAGNOSIS — I10 Essential (primary) hypertension: Secondary | ICD-10-CM | POA: Diagnosis not present

## 2021-05-27 DIAGNOSIS — Z79899 Other long term (current) drug therapy: Secondary | ICD-10-CM | POA: Diagnosis not present

## 2021-05-27 DIAGNOSIS — R112 Nausea with vomiting, unspecified: Secondary | ICD-10-CM

## 2021-05-27 DIAGNOSIS — K5900 Constipation, unspecified: Secondary | ICD-10-CM | POA: Diagnosis not present

## 2021-05-27 DIAGNOSIS — Z794 Long term (current) use of insulin: Secondary | ICD-10-CM | POA: Insufficient documentation

## 2021-05-27 DIAGNOSIS — R1110 Vomiting, unspecified: Secondary | ICD-10-CM | POA: Diagnosis not present

## 2021-05-27 DIAGNOSIS — J45909 Unspecified asthma, uncomplicated: Secondary | ICD-10-CM | POA: Insufficient documentation

## 2021-05-27 DIAGNOSIS — R531 Weakness: Secondary | ICD-10-CM | POA: Diagnosis not present

## 2021-05-27 LAB — I-STAT VENOUS BLOOD GAS, ED
Acid-Base Excess: 1 mmol/L (ref 0.0–2.0)
Bicarbonate: 26.9 mmol/L (ref 20.0–28.0)
Calcium, Ion: 1.21 mmol/L (ref 1.15–1.40)
HCT: 41 % (ref 39.0–52.0)
Hemoglobin: 13.9 g/dL (ref 13.0–17.0)
O2 Saturation: 47 %
Patient temperature: 98.8
Potassium: 4.2 mmol/L (ref 3.5–5.1)
Sodium: 137 mmol/L (ref 135–145)
TCO2: 28 mmol/L (ref 22–32)
pCO2, Ven: 45 mmHg (ref 44.0–60.0)
pH, Ven: 7.386 (ref 7.250–7.430)
pO2, Ven: 26 mmHg — CL (ref 32.0–45.0)

## 2021-05-27 LAB — CBC
HCT: 40.9 % (ref 39.0–52.0)
Hemoglobin: 13.6 g/dL (ref 13.0–17.0)
MCH: 30.4 pg (ref 26.0–34.0)
MCHC: 33.3 g/dL (ref 30.0–36.0)
MCV: 91.5 fL (ref 80.0–100.0)
Platelets: 234 10*3/uL (ref 150–400)
RBC: 4.47 MIL/uL (ref 4.22–5.81)
RDW: 13 % (ref 11.5–15.5)
WBC: 11.6 10*3/uL — ABNORMAL HIGH (ref 4.0–10.5)
nRBC: 0 % (ref 0.0–0.2)

## 2021-05-27 LAB — COMPREHENSIVE METABOLIC PANEL
ALT: 20 U/L (ref 0–44)
AST: 16 U/L (ref 15–41)
Albumin: 4.5 g/dL (ref 3.5–5.0)
Alkaline Phosphatase: 53 U/L (ref 38–126)
Anion gap: 10 (ref 5–15)
BUN: 13 mg/dL (ref 8–23)
CO2: 26 mmol/L (ref 22–32)
Calcium: 9.9 mg/dL (ref 8.9–10.3)
Chloride: 101 mmol/L (ref 98–111)
Creatinine, Ser: 0.85 mg/dL (ref 0.61–1.24)
GFR, Estimated: >60 mL/min (ref >60)
Glucose, Bld: 286 mg/dL — ABNORMAL HIGH (ref 70–99)
Potassium: 4.1 mmol/L (ref 3.5–5.1)
Sodium: 137 mmol/L (ref 135–145)
Total Bilirubin: 0.6 mg/dL (ref 0.3–1.2)
Total Protein: 7.1 g/dL (ref 6.5–8.1)

## 2021-05-27 LAB — URINALYSIS, ROUTINE W REFLEX MICROSCOPIC
Bilirubin Urine: NEGATIVE
Glucose, UA: >1000 mg/dL — AB
Hgb urine dipstick: NEGATIVE
Leukocytes,Ua: NEGATIVE
Nitrite: NEGATIVE
Specific Gravity, Urine: 1.034 — ABNORMAL HIGH (ref 1.005–1.030)
pH: 5 (ref 5.0–8.0)

## 2021-05-27 LAB — RESP PANEL BY RT-PCR (FLU A&B, COVID) ARPGX2
Influenza A by PCR: NEGATIVE
Influenza B by PCR: NEGATIVE
SARS Coronavirus 2 by RT PCR: NEGATIVE

## 2021-05-27 LAB — POCT FASTING CBG KUC MANUAL ENTRY: POCT Glucose (KUC): 311 mg/dL — AB (ref 70–99)

## 2021-05-27 LAB — TROPONIN I (HIGH SENSITIVITY): Troponin I (High Sensitivity): 8 ng/L (ref <18)

## 2021-05-27 LAB — CBG MONITORING, ED: Glucose-Capillary: 310 mg/dL — ABNORMAL HIGH (ref 70–99)

## 2021-05-27 LAB — LIPASE, BLOOD: Lipase: 23 U/L (ref 11–51)

## 2021-05-27 MED ORDER — SODIUM CHLORIDE 0.9 % IV BOLUS
1000.0000 mL | Freq: Once | INTRAVENOUS | Status: AC
  Administered 2021-05-27: 1000 mL via INTRAVENOUS

## 2021-05-27 MED ORDER — IOHEXOL 300 MG/ML  SOLN
100.0000 mL | Freq: Once | INTRAMUSCULAR | Status: AC | PRN
  Administered 2021-05-27: 100 mL via INTRAVENOUS

## 2021-05-27 MED ORDER — ONDANSETRON HCL 4 MG/2ML IJ SOLN
4.0000 mg | Freq: Once | INTRAMUSCULAR | Status: AC
  Administered 2021-05-27: 4 mg via INTRAVENOUS

## 2021-05-27 MED ORDER — ONDANSETRON HCL 4 MG PO TABS: 4.0000 mg | ORAL_TABLET | Freq: Four times a day (QID) | ORAL | 0 refills | Status: DC

## 2021-05-27 NOTE — ED Notes (Signed)
Cbg 310

## 2021-05-27 NOTE — ED Provider Notes (Signed)
I provided a substantive portion of the care of this patient.  I personally performed the entirety of the history for this encounter.  EKG Interpretation  Date/Time:  Saturday May 27 2021 14:12:35 EST Ventricular Rate:  87 PR Interval:  198 QRS Duration: 95 QT Interval:  371 QTC Calculation: 447 R Axis:   -1 Text Interpretation: Second degree AV block, Mobitz II Consider right atrial enlargement one dropped beat , otherwise normal, no old available Confirmed by Arby Barrette [phone]) on 05/27/2021 2:51:16 PM   Patient has several symptoms over the past few days including nausea and vomiting.  He denies he had abdominal pain with this.  No fever or diarrhea.  Patient perceives constipation and chills.  He has missed doses of his Ozempic and had elevated blood sugar.  He is referred from urgent care for further evaluation.  Alert.  Mental status clear.  Heart regular.  Lungs clear.  Abdomen without guarding.   Nonspecific generalized symptoms with nausea and vomiting.  Diagnostic work-up without acute findings.  I agree with plan of management.    Arby Barrette, MD 06/07/21 [phone]

## 2021-05-27 NOTE — ED Triage Notes (Signed)
Onset vomiting 2 nights ago - states his Ozempic Rx was delayed, and he wasn't able to get it until yesterday.  States vomiting has stopped, but continues feeling very sluggish.  Reports CBG at home 300 just PTA.

## 2021-05-27 NOTE — Discharge Instructions (Signed)
Please report to ED after leaving office:  [facility]  [address] [place], [place] [address]

## 2021-05-27 NOTE — ED Provider Notes (Signed)
[facility]    CSN: [id number] Arrival date & time: 05/27/21  1109      History   Chief Complaint Chief Complaint  Patient presents with   Fatigue   Hyperglycemia    HPI Andrew Duncan is a 75 y.o. male.   Patient here today for evaluation of 2-day history of vomiting and nausea.  He is not sure if this is related to his glucose levels as he reports his Ozempic was delayed.  He checked his blood sugar earlier and it was 300.  He denies any fever.  He has not had any diarrhea.  Has been able to hold down liquids but not solid food.  The history is provided by the patient.  Hyperglycemia Associated symptoms: nausea and vomiting   Associated symptoms: no fever and no shortness of breath    Past Medical History:  Diagnosis Date   Anemia    Childhood asthma    Diabetes (HCC)    Essential hypertension    Hypercholesterolemia    Hyperlipidemia    Polyneuropathy    Retinopathy     There are no problems to display for this patient.   Past Surgical History:  Procedure Laterality Date   ANAL FISSURE REPAIR     left elbow fracture pinning     pinning       Home Medications    Prior to Admission medications   Medication Sig Start Date End Date Taking? Authorizing Provider  aspirin EC 81 MG tablet Take 81 mg by mouth daily.   Yes [provider]  glipiZIDE (GLUCOTROL XL) 10 MG 24 hr tablet Take 10 mg by mouth daily. 02/28/18  Yes [provider]  JANUVIA 100 MG tablet Take 100 mg by mouth daily. 02/24/18  Yes [provider]  LANTUS SOLOSTAR 100 UNIT/ML Solostar Pen INJECT 43 UNITS ONCE A DAY SUBCUTANEOUSLY 02/14/18  Yes [provider]  latanoprost (XALATAN) 0.005 % ophthalmic solution APPLY 1 DROP IN BOTH EYES EVERY NIGHT 01/31/18  Yes [provider]  losartan (COZAAR) 25 MG tablet  03/21/18  Yes [provider]  metFORMIN (GLUCOPHAGE) 500 MG tablet  03/17/18  Yes [provider]  pravastatin  (PRAVACHOL) 20 MG tablet Take 20 mg by mouth daily. 02/03/18  Yes [provider]  Semaglutide (OZEMPIC, 2 MG/DOSE, [place]) Inject into the skin once a week.   Yes [provider]  timolol (TIMOPTIC) 0.5 % ophthalmic solution INSTILL 1 DROP INTO BOTH EYES TWICE A DAY 01/07/18  Yes [provider]  Albuterol Sulfate (PROAIR HFA IN) Inhale into the lungs.    [provider]  BD PEN NEEDLE NANO U/F 32G X 4 MM MISC USE NEEDLES FOR INJECTION TWICE A DAY 01/30/18   [provider]  benzonatate (TESSALON) 100 MG capsule Take 1 capsule (100 mg total) by mouth every 8 (eight) hours. 07/16/20   Hall-Potvin, [place], PA-C  DIPHENHYDRAMINE HCL PO Take 10 mg by mouth.    [provider]  sildenafil (VIAGRA) 100 MG tablet Take 100 mg by mouth daily as needed for erectile dysfunction.    [provider]    Family History Family History  Problem Relation Age of Onset   Diabetes Mother    Stroke Mother     Social History Social History   Tobacco Use   Smoking status: Never   Smokeless tobacco: Never  Vaping Use   Vaping Use: Never used  Substance Use Topics   Alcohol use: Yes  Comment: occasionally   Drug use: Never     Allergies   Patient has no known allergies.   Review of Systems Review of Systems  Constitutional:  Negative for chills and fever.  Eyes:  Negative for discharge and redness.  Respiratory:  Negative for shortness of breath.   Gastrointestinal:  Positive for nausea and vomiting. Negative for diarrhea.  Skin:  Positive for color change and wound.  Neurological:  Negative for numbness.    Physical Exam Triage Vital Signs ED Triage Vitals [05/27/21 1129]  Enc Vitals Group     BP (!) 170/84     Pulse Rate 88     Resp 20     Temp 98.2 F (36.8 C)     Temp Source Oral     SpO2 96 %     Weight      Height      Head Circumference      Peak Flow      Pain Score 0     Pain Loc      Pain Edu?      Excl. in [place]?     No data found.  Updated Vital Signs BP (!) 170/84 Comment: reports taking HTN med approx 3 hrs ago  Pulse 88   Temp 98.2 F (36.8 C) (Oral)   Resp 20   SpO2 96%   Physical Exam Vitals and nursing note reviewed.  Constitutional:      General: He is not in acute distress.    Appearance: Normal appearance. He is ill-appearing (appears to not feel well).  HENT:     Head: Normocephalic and atraumatic.  Eyes:     Conjunctiva/sclera: Conjunctivae normal.  Cardiovascular:     Rate and Rhythm: Normal rate and regular rhythm.  Pulmonary:     Effort: Pulmonary effort is normal. No respiratory distress.     Breath sounds: Normal breath sounds. No wheezing, rhonchi or rales.  Neurological:     Mental Status: He is alert.  Psychiatric:        Mood and Affect: Mood normal.        Behavior: Behavior normal.        Thought Content: Thought content normal.     UC Treatments / Results  Labs (all labs ordered are listed, but only abnormal results are displayed) Labs Reviewed  POCT FASTING CBG [facility] MANUAL ENTRY - Abnormal; Notable for the following components:      Result Value   POCT Glucose ([facility]) 311 (*)    All other components within normal limits    EKG   Radiology No results found.  Procedures Procedures (including critical care time)  Medications Ordered in UC Medications - No data to display  Initial Impression / Assessment and Plan / UC Course  I have reviewed the triage vital signs and the nursing notes.  Pertinent labs & imaging results that were available during my care of the patient were reviewed by me and considered in my medical decision making (see chart for details).  Glucose levels similar to prior office visits, but given reported symptoms recommend further evaluation in the emergency department for stat labs.  Patient is agreeable to same and wife will drive him to [facility] for further evaluation.  Final Clinical Impressions(s) / UC  Diagnoses   Final diagnoses:  Hyperglycemia  Essential hypertension  Lightheadedness     Discharge Instructions      Please report to ED after leaving office:  [facility]  [place], [place] [address]     ED Prescriptions   None    PDMP not reviewed this encounter.   Francene Finders, PA-C 05/27/21 1206

## 2021-05-27 NOTE — ED Notes (Signed)
Pts wife refused for patient to be stuck for 2nd troponin

## 2021-05-27 NOTE — ED Triage Notes (Addendum)
Pt arrives pov, ambulatory to triage, referred by UC "for fluids", c/o emesis x 3 days. Emesis has resolved, feels weak. PT aox4, speech clear

## 2021-05-27 NOTE — ED Provider Notes (Signed)
[facility] EMERGENCY DEPT Provider Note   CSN: [id number] Arrival date & time: 05/27/21  1216     History Chief Complaint  Patient presents with   Fatigue    Andrew Duncan is a 75 y.o. male.  HPI   Pt is a 75 y/o male with a h/o anemia, asthma, DM, HTN, HLD, polyneuropathy, retinopathy, who presents to the ED today for eval of NV. States he started vomiting 3 days ago. He denies abd pain, diarrhea, fevers, urinary sxs, uri sxs. Reports constipation, chills. He has been passing flatus.    Past Medical History:  Diagnosis Date   Anemia    Childhood asthma    Diabetes (HCC)    Essential hypertension    Hypercholesterolemia    Hyperlipidemia    Polyneuropathy    Retinopathy     There are no problems to display for this patient.   Past Surgical History:  Procedure Laterality Date   ANAL FISSURE REPAIR     left elbow fracture pinning     pinning       Family History  Problem Relation Age of Onset   Diabetes Mother    Stroke Mother     Social History   Tobacco Use   Smoking status: Never   Smokeless tobacco: Never  Vaping Use   Vaping Use: Never used  Substance Use Topics   Alcohol use: Not Currently    Comment: occasionally   Drug use: Never    Home Medications Prior to Admission medications   Medication Sig Start Date End Date Taking? Authorizing Provider  ondansetron (ZOFRAN) 4 MG tablet Take 1 tablet (4 mg total) by mouth every 6 (six) hours. 05/27/21  Yes Liesa Tsan S, PA-C  Albuterol Sulfate (PROAIR HFA IN) Inhale into the lungs.    [provider]  aspirin EC 81 MG tablet Take 81 mg by mouth daily.    [provider]  BD PEN NEEDLE NANO U/F 32G X 4 MM MISC USE NEEDLES FOR INJECTION TWICE A DAY 01/30/18   [provider]  benzonatate (TESSALON) 100 MG capsule Take 1 capsule (100 mg total) by mouth every 8 (eight) hours. 07/16/20   Hall-Potvin, [place], PA-C  DIPHENHYDRAMINE HCL PO Take 10 mg by  mouth.    [provider]  glipiZIDE (GLUCOTROL XL) 10 MG 24 hr tablet Take 10 mg by mouth daily. 02/28/18   [provider]  JANUVIA 100 MG tablet Take 100 mg by mouth daily. 02/24/18   [provider]  LANTUS SOLOSTAR 100 UNIT/ML Solostar Pen INJECT 43 UNITS ONCE A DAY SUBCUTANEOUSLY 02/14/18   [provider]  latanoprost (XALATAN) 0.005 % ophthalmic solution APPLY 1 DROP IN BOTH EYES EVERY NIGHT 01/31/18   [provider]  losartan (COZAAR) 25 MG tablet  03/21/18   [provider]  metFORMIN (GLUCOPHAGE) 500 MG tablet  03/17/18   [provider]  pravastatin (PRAVACHOL) 20 MG tablet Take 20 mg by mouth daily. 02/03/18   [provider]  Semaglutide (OZEMPIC, 2 MG/DOSE, [place]) Inject into the skin once a week.    [provider]  sildenafil (VIAGRA) 100 MG tablet Take 100 mg by mouth daily as needed for erectile dysfunction.    [provider]  timolol (TIMOPTIC) 0.5 % ophthalmic solution INSTILL 1 DROP INTO BOTH EYES TWICE A DAY 01/07/18   [provider]    Allergies    Patient has no known allergies.  Review of Systems  Review of Systems  Constitutional:  Negative for fever.  HENT:  Negative for ear pain and sore throat.   Eyes:  Negative for visual disturbance.  Respiratory:  Negative for cough and shortness of breath.   Cardiovascular:  Negative for chest pain.  Gastrointestinal:  Positive for constipation, nausea and vomiting. Negative for abdominal pain and diarrhea.  Genitourinary:  Negative for dysuria and hematuria.  Musculoskeletal:  Negative for back pain.  Skin:  Negative for color change and rash.  Neurological:  Negative for weakness and headaches.  All other systems reviewed and are negative.  Physical Exam Updated Vital Signs BP (!) 178/89   Pulse 87   Temp 98.6 F (37 C) (Oral)   Resp (!) 25   SpO2 95%   Physical Exam Vitals and nursing note reviewed.  Constitutional:       General: He is not in acute distress.    Appearance: He is well-developed.  HENT:     Head: Normocephalic and atraumatic.  Eyes:     Conjunctiva/sclera: Conjunctivae normal.  Cardiovascular:     Rate and Rhythm: Normal rate and regular rhythm.     Heart sounds: Normal heart sounds. No murmur heard. Pulmonary:     Effort: Pulmonary effort is normal. No respiratory distress.     Breath sounds: Normal breath sounds. No wheezing, rhonchi or rales.  Abdominal:     General: Bowel sounds are normal.     Palpations: Abdomen is soft.     Tenderness: There is no abdominal tenderness. There is no guarding or rebound.  Musculoskeletal:        General: No swelling.     Cervical back: Neck supple.  Skin:    General: Skin is warm and dry.     Capillary Refill: Capillary refill takes less than 2 seconds.  Neurological:     Mental Status: He is alert.  Psychiatric:        Mood and Affect: Mood normal.    ED Results / Procedures / Treatments   Labs (all labs ordered are listed, but only abnormal results are displayed) Labs Reviewed  COMPREHENSIVE METABOLIC PANEL - Abnormal; Notable for the following components:      Result Value   Glucose, Bld 286 (*)    All other components within normal limits  CBC - Abnormal; Notable for the following components:   WBC 11.6 (*)    All other components within normal limits  URINALYSIS, ROUTINE W REFLEX MICROSCOPIC - Abnormal; Notable for the following components:   Specific Gravity, Urine 1.034 (*)    Glucose, UA >1,000 (*)    Ketones, ur TRACE (*)    Protein, ur TRACE (*)    Bacteria, UA RARE (*)    All other components within normal limits  CBG MONITORING, ED - Abnormal; Notable for the following components:   Glucose-Capillary 310 (*)    All other components within normal limits  I-STAT VENOUS BLOOD GAS, ED - Abnormal; Notable for the following components:   pO2, Ven 26.0 (*)    All other components within normal limits  RESP PANEL BY  RT-PCR (FLU A&B, COVID) ARPGX2  LIPASE, BLOOD  BLOOD GAS, VENOUS  TROPONIN I (HIGH SENSITIVITY)  TROPONIN I (HIGH SENSITIVITY)    EKG EKG Interpretation  Date/Time:  Saturday May 27 2021 14:12:35 EST Ventricular Rate:  87 PR Interval:  198 QRS Duration: 95 QT Interval:  371 QTC Calculation: 447 R Axis:   -1 Text Interpretation: Second degree AV block, Mobitz  II Consider right atrial enlargement one dropped beat , otherwise normal, no old available Confirmed by Arby Barrette [phone]) on 05/27/2021 2:51:16 PM  Radiology DG Chest 2 View  Result Date: 05/27/2021 CLINICAL DATA:  Patient complains of nausea, vomiting, and weakness for 3 days. EXAM: CHEST - 2 VIEW COMPARISON:  Chest radiograph 07/09/2018 FINDINGS: The heart size and mediastinal contours are within normal limits. Both lungs are clear. No acute osseous abnormality. IMPRESSION: No active cardiopulmonary disease. Electronically Signed   By: Sherron Ales M.D.   On: 05/27/2021 14:38   CT ABDOMEN PELVIS W CONTRAST  Result Date: 05/27/2021 CLINICAL DATA:  Nausea and vomiting EXAM: CT ABDOMEN AND PELVIS WITH CONTRAST TECHNIQUE: Multidetector CT imaging of the abdomen and pelvis was performed using the standard protocol following bolus administration of intravenous contrast. CONTRAST:  OMNIPAQUE IOHEXOL 300 MG/ML  SOLN COMPARISON:  None. FINDINGS: Lower chest: No acute abnormality. Hepatobiliary: No focal liver abnormality. Nonspecific hydropic gallbladder. Punctate hyperdensity layering within the gallbladder lumen may represent tiny gallstones. No gallbladder wall thickening or pericholecystic fluid. No biliary dilatation. Pancreas: No focal lesion. Normal pancreatic contour. No surrounding inflammatory changes. No main pancreatic ductal dilatation. Spleen: Normal in size without focal abnormality. Adrenals/Urinary Tract: No adrenal nodule bilaterally. Bilateral kidneys enhance symmetrically. No hydronephrosis. No  hydroureter. The urinary bladder is unremarkable. Stomach/Bowel: Stomach is within normal limits. No evidence of bowel wall thickening or dilatation. Scattered colonic diverticulosis. Appendix appears normal. Vascular/Lymphatic: No abdominal aorta or iliac aneurysm. Mild atherosclerotic plaque of the aorta and its branches. No abdominal, pelvic, or inguinal lymphadenopathy. Reproductive: The prostate is enlarged measuring up to 5 cm. Other: No intraperitoneal free fluid. No intraperitoneal free gas. No organized fluid collection. Musculoskeletal: No abdominal wall hernia or abnormality. No suspicious lytic or blastic osseous lesions. No acute displaced fracture. L5-S1 intervertebral disc space vacuum phenomenon and endplate sclerosis. IMPRESSION: 1. Possible cholelithiasis. 2. Colonic diverticulosis with no acute diverticulitis. 3. Prostatomegaly. 4.  Aortic Atherosclerosis (ICD10-I70.0). Electronically Signed   By: Tish Frederickson M.D.   On: 05/27/2021 15:39    Procedures Procedures   Medications Ordered in ED Medications  sodium chloride 0.9 % bolus 1,000 mL (1,000 mLs Intravenous New Bag/Given 05/27/21 1332)  ondansetron (ZOFRAN) injection 4 mg (4 mg Intravenous Given 05/27/21 1332)  iohexol (OMNIPAQUE) 300 MG/ML solution 100 mL (100 mLs Intravenous Contrast Given 05/27/21 1510)    ED Course  I have reviewed the triage vital signs and the nursing notes.  Pertinent labs & imaging results that were available during my care of the patient were reviewed by me and considered in my medical decision making (see chart for details).    MDM Rules/Calculators/A&P                          75 y/o M here for NV.   Reviewed/interpreted labs CBC with mild leukocytosis, no anemia CMP with elevated blood glucose, no signs of dka, normal lfts, normal lytes Lipase neg UA with glucosuria, ketonuria, no signs of infection VBG with normal ph Viral panel neg CBG elevated on initial check   EKG with Second  degree AV block, Mobitz II Consider right atrial enlargement one dropped beat , otherwise normal, no old available   Reviewed/interpreted imaging CT abd/pelvis - 1. Possible cholelithiasis. 2. Colonic diverticulosis with no acute diverticulitis. 3. Prostatomegaly. 4.  Aortic Atherosclerosis   3:17 PM CONSULT With Dr. Duke Salvia with cardiology who recommends outpatient follow up. Does not feel  pt requires admission or urgent pacemaker based on presentation  Patient's work-up here is reassuring.  His labs are nonconcerning and he has a CT scan does not show any acute findings.  He did have cholelithiasis but he any changes to his gallbladder.  No abdominal tenderness and no lab findings concerning for this.  He was given IV fluids and antiemetics here in the ED, on reassessment he was able to tolerate p.o.  Feel that he likely had a viral illness and he feels well enough for discharge at the time of reassessment.  He has no signs of DKA on lab work.  Feel he is appropriate for discharge with symptomatic management.  We will also give him information follow-up with cardiology.  Patient and wife at bedside voiced understanding the plan and reasons to return.  All Questions answered.  Patient stable for discharge.  Final Clinical Impression(s) / ED Diagnoses Final diagnoses:  Nausea and vomiting, unspecified vomiting type  Hyperglycemia    Rx / DC Orders ED Discharge Orders          Ordered    ondansetron (ZOFRAN) 4 MG tablet  Every 6 hours        05/27/21 1704             Rodney Booze, PA-C 05/27/21 1706    Charlesetta Shanks, MD 06/07/21 1515

## 2021-05-27 NOTE — ED Notes (Signed)
Unable to obtain blood at this time. Pt has been stuck by 3 rn's. Iv obtained,but no blood for labs.PA aware

## 2021-05-27 NOTE — ED Notes (Signed)
Patient transported to CT 

## 2021-05-27 NOTE — Discharge Instructions (Signed)
You were given a prescription for zofran to help with your nausea. Please take as directed.  Please follow up with your primary doctor within the next 5-7 days.  If you do not have a primary care provider, information for a healthcare clinic has been provided for you to make arrangements for follow up care. Please return to the ER sooner if you have any new or worsening symptoms, or if you have any of the following symptoms:  Abdominal pain that does not go away.  You have a fever.  You keep throwing up (vomiting).  The pain is felt only in portions of the abdomen. Pain in the right side could possibly be appendicitis. In an adult, pain in the left lower portion of the abdomen could be colitis or diverticulitis.  You pass bloody or black tarry stools.  There is bright red blood in the stool.  The constipation stays for more than 4 days.  There is belly (abdominal) or rectal pain.  You do not seem to be getting better.  You have any questions or concerns.   

## 2021-05-27 NOTE — ED Notes (Signed)
Pt tolerated fluid challenge. 

## 2021-05-27 NOTE — ED Notes (Signed)
Ambulate to bathroom

## 2021-05-27 NOTE — ED Notes (Signed)
Patient transported to X-ray 

## 2021-05-30 ENCOUNTER — Other Ambulatory Visit: Payer: Self-pay

## 2021-05-30 ENCOUNTER — Inpatient Hospital Stay (HOSPITAL_COMMUNITY)
Admission: EM | Admit: 2021-05-30 | Discharge: 2021-06-05 | DRG: 871 | Disposition: A | Discharge status: Home / Self Care | Payer: Medicare Other | Attending: Internal Medicine | Admitting: Internal Medicine

## 2021-05-30 ENCOUNTER — Encounter (HOSPITAL_COMMUNITY): Payer: Self-pay

## 2021-05-30 ENCOUNTER — Other Ambulatory Visit: Payer: Self-pay | Admitting: Internal Medicine

## 2021-05-30 ENCOUNTER — Ambulatory Visit
Admission: RE | Admit: 2021-05-30 | Discharge: 2021-05-30 | Disposition: A | Discharge status: Home / Self Care | Payer: Medicare Other | Source: Ambulatory Visit | Attending: Internal Medicine | Admitting: Internal Medicine

## 2021-05-30 DIAGNOSIS — K750 Abscess of liver: Secondary | ICD-10-CM | POA: Diagnosis not present

## 2021-05-30 DIAGNOSIS — Z794 Long term (current) use of insulin: Secondary | ICD-10-CM

## 2021-05-30 DIAGNOSIS — K810 Acute cholecystitis: Principal | ICD-10-CM | POA: Diagnosis present

## 2021-05-30 DIAGNOSIS — A419 Sepsis, unspecified organism: Secondary | ICD-10-CM | POA: Diagnosis not present

## 2021-05-30 DIAGNOSIS — E119 Type 2 diabetes mellitus without complications: Secondary | ICD-10-CM

## 2021-05-30 DIAGNOSIS — E785 Hyperlipidemia, unspecified: Secondary | ICD-10-CM | POA: Diagnosis present

## 2021-05-30 DIAGNOSIS — I10 Essential (primary) hypertension: Secondary | ICD-10-CM | POA: Diagnosis present

## 2021-05-30 DIAGNOSIS — Z7984 Long term (current) use of oral hypoglycemic drugs: Secondary | ICD-10-CM

## 2021-05-30 DIAGNOSIS — E7800 Pure hypercholesterolemia, unspecified: Secondary | ICD-10-CM | POA: Diagnosis present

## 2021-05-30 DIAGNOSIS — E876 Hypokalemia: Secondary | ICD-10-CM | POA: Diagnosis present

## 2021-05-30 DIAGNOSIS — R112 Nausea with vomiting, unspecified: Secondary | ICD-10-CM | POA: Diagnosis not present

## 2021-05-30 DIAGNOSIS — E11649 Type 2 diabetes mellitus with hypoglycemia without coma: Secondary | ICD-10-CM | POA: Diagnosis not present

## 2021-05-30 DIAGNOSIS — R7989 Other specified abnormal findings of blood chemistry: Secondary | ICD-10-CM

## 2021-05-30 DIAGNOSIS — J90 Pleural effusion, not elsewhere classified: Secondary | ICD-10-CM | POA: Diagnosis not present

## 2021-05-30 DIAGNOSIS — J9601 Acute respiratory failure with hypoxia: Secondary | ICD-10-CM | POA: Diagnosis not present

## 2021-05-30 DIAGNOSIS — J918 Pleural effusion in other conditions classified elsewhere: Secondary | ICD-10-CM | POA: Diagnosis present

## 2021-05-30 DIAGNOSIS — Z7982 Long term (current) use of aspirin: Secondary | ICD-10-CM

## 2021-05-30 DIAGNOSIS — R0989 Other specified symptoms and signs involving the circulatory and respiratory systems: Secondary | ICD-10-CM

## 2021-05-30 DIAGNOSIS — Z79899 Other long term (current) drug therapy: Secondary | ICD-10-CM

## 2021-05-30 DIAGNOSIS — J189 Pneumonia, unspecified organism: Secondary | ICD-10-CM | POA: Diagnosis present

## 2021-05-30 DIAGNOSIS — R945 Abnormal results of liver function studies: Secondary | ICD-10-CM | POA: Diagnosis not present

## 2021-05-30 DIAGNOSIS — R000 Tachycardia, unspecified: Secondary | ICD-10-CM | POA: Diagnosis not present

## 2021-05-30 DIAGNOSIS — E871 Hypo-osmolality and hyponatremia: Secondary | ICD-10-CM | POA: Diagnosis present

## 2021-05-30 DIAGNOSIS — I700 Atherosclerosis of aorta: Secondary | ICD-10-CM | POA: Diagnosis not present

## 2021-05-30 DIAGNOSIS — R188 Other ascites: Secondary | ICD-10-CM | POA: Diagnosis present

## 2021-05-30 DIAGNOSIS — Z20822 Contact with and (suspected) exposure to covid-19: Secondary | ICD-10-CM | POA: Diagnosis not present

## 2021-05-30 DIAGNOSIS — R600 Localized edema: Secondary | ICD-10-CM | POA: Diagnosis present

## 2021-05-30 DIAGNOSIS — I441 Atrioventricular block, second degree: Secondary | ICD-10-CM | POA: Diagnosis not present

## 2021-05-30 DIAGNOSIS — N179 Acute kidney failure, unspecified: Secondary | ICD-10-CM | POA: Diagnosis not present

## 2021-05-30 DIAGNOSIS — E1165 Type 2 diabetes mellitus with hyperglycemia: Secondary | ICD-10-CM | POA: Diagnosis present

## 2021-05-30 DIAGNOSIS — R7401 Elevation of levels of liver transaminase levels: Secondary | ICD-10-CM | POA: Diagnosis present

## 2021-05-30 LAB — COMPREHENSIVE METABOLIC PANEL
ALT: 162 U/L — ABNORMAL HIGH (ref 0–44)
AST: 86 U/L — ABNORMAL HIGH (ref 15–41)
Albumin: 3.2 g/dL — ABNORMAL LOW (ref 3.5–5.0)
Alkaline Phosphatase: 111 U/L (ref 38–126)
Anion gap: 12 (ref 5–15)
BUN: 38 mg/dL — ABNORMAL HIGH (ref 8–23)
CO2: 22 mmol/L (ref 22–32)
Calcium: 9.1 mg/dL (ref 8.9–10.3)
Chloride: 97 mmol/L — ABNORMAL LOW (ref 98–111)
Creatinine, Ser: 1.6 mg/dL — ABNORMAL HIGH (ref 0.61–1.24)
GFR, Estimated: 45 mL/min — ABNORMAL LOW (ref >60)
Glucose, Bld: 450 mg/dL — ABNORMAL HIGH (ref 70–99)
Potassium: 4 mmol/L (ref 3.5–5.1)
Sodium: 131 mmol/L — ABNORMAL LOW (ref 135–145)
Total Bilirubin: 0.9 mg/dL (ref 0.3–1.2)
Total Protein: 7.1 g/dL (ref 6.5–8.1)

## 2021-05-30 LAB — CBC WITH DIFFERENTIAL/PLATELET
Abs Immature Granulocytes: 0 10*3/uL (ref 0.00–0.07)
Band Neutrophils: 2 %
Basophils Absolute: 0 10*3/uL (ref 0.0–0.1)
Basophils Relative: 0 %
Eosinophils Absolute: 0.2 10*3/uL (ref 0.0–0.5)
Eosinophils Relative: 1 %
HCT: 40.2 % (ref 39.0–52.0)
Hemoglobin: 13.2 g/dL (ref 13.0–17.0)
Lymphocytes Relative: 11 %
Lymphs Abs: 2.1 10*3/uL (ref 0.7–4.0)
MCH: 30.3 pg (ref 26.0–34.0)
MCHC: 32.8 g/dL (ref 30.0–36.0)
MCV: 92.2 fL (ref 80.0–100.0)
Monocytes Absolute: 1.8 10*3/uL — ABNORMAL HIGH (ref 0.1–1.0)
Monocytes Relative: 9 %
Neutro Abs: 15.4 10*3/uL — ABNORMAL HIGH (ref 1.7–7.7)
Neutrophils Relative %: 77 %
Platelets: 274 10*3/uL (ref 150–400)
RBC: 4.36 MIL/uL (ref 4.22–5.81)
RDW: 13.2 % (ref 11.5–15.5)
WBC: 19.5 10*3/uL — ABNORMAL HIGH (ref 4.0–10.5)
nRBC: 0 % (ref 0.0–0.2)

## 2021-05-30 LAB — RESP PANEL BY RT-PCR (FLU A&B, COVID) ARPGX2
Influenza A by PCR: NEGATIVE
Influenza B by PCR: NEGATIVE
SARS Coronavirus 2 by RT PCR: NEGATIVE

## 2021-05-30 LAB — LACTIC ACID, PLASMA: Lactic Acid, Venous: 2.4 mmol/L (ref 0.5–1.9)

## 2021-05-30 MED ORDER — SODIUM CHLORIDE 0.9 % IV SOLN
500.0000 mg | INTRAVENOUS | Status: DC
  Administered 2021-05-30 – 2021-05-31 (×2): 500 mg via INTRAVENOUS
  Filled 2021-05-30: qty 500

## 2021-05-30 MED ORDER — LACTATED RINGERS IV BOLUS
1000.0000 mL | Freq: Once | INTRAVENOUS | Status: AC
  Administered 2021-05-30: 1000 mL via INTRAVENOUS

## 2021-05-30 MED ORDER — SODIUM CHLORIDE 0.9 % IV SOLN
1.0000 g | INTRAVENOUS | Status: DC
  Administered 2021-05-30: 1 g via INTRAVENOUS

## 2021-05-30 MED ORDER — INSULIN ASPART 100 UNIT/ML IJ SOLN
10.0000 [IU] | Freq: Once | INTRAMUSCULAR | Status: AC
  Administered 2021-05-30: 10 [IU] via SUBCUTANEOUS
  Filled 2021-05-30: qty 0.1

## 2021-05-30 MED ORDER — LACTATED RINGERS IV SOLN: INTRAVENOUS | Status: DC

## 2021-05-30 NOTE — ED Triage Notes (Addendum)
Patient reports that he was diagnosed with pneumonia today and was told to come to the ED for IV antibiotics and to be admitted.  Patient also c/o right abdominal pain and states no BM in 2 days.

## 2021-05-30 NOTE — ED Provider Notes (Signed)
[facility]-EMERGENCY DEPT Provider Note   CSN: [id number] Arrival date & time: 05/30/21  1419     History Chief Complaint  Patient presents with   Abnormal Lab    Andrew Duncan is a 75 y.o. male.  75 year old male presents with several days of fatigue and weakness.  He has had some cough and shortness of breath.  Saw his PCP today in office who did an outpatient x-ray which was positive for right-sided pneumonia.  He denies any vomiting or diarrhea.  States he has been somewhat tachypneic.  Sent here for further evaluation      Past Medical History:  Diagnosis Date   Anemia    Childhood asthma    Diabetes (HCC)    Essential hypertension    Hypercholesterolemia    Hyperlipidemia    Polyneuropathy    Retinopathy     There are no problems to display for this patient.   Past Surgical History:  Procedure Laterality Date   ANAL FISSURE REPAIR     left elbow fracture pinning     pinning       Family History  Problem Relation Age of Onset   Diabetes Mother    Stroke Mother     Social History   Tobacco Use   Smoking status: Never   Smokeless tobacco: Never  Vaping Use   Vaping Use: Never used  Substance Use Topics   Alcohol use: Not Currently   Drug use: Never    Home Medications Prior to Admission medications   Medication Sig Start Date End Date Taking? Authorizing Provider  Albuterol Sulfate (PROAIR HFA IN) Inhale into the lungs.    [provider]  aspirin EC 81 MG tablet Take 81 mg by mouth daily.    [provider]  BD PEN NEEDLE NANO U/F 32G X 4 MM MISC USE NEEDLES FOR INJECTION TWICE A DAY 01/30/18   [provider]  benzonatate (TESSALON) 100 MG capsule Take 1 capsule (100 mg total) by mouth every 8 (eight) hours. 07/16/20   Hall-Potvin, [place], PA-C  DIPHENHYDRAMINE HCL PO Take 10 mg by mouth.    [provider]  glipiZIDE (GLUCOTROL XL) 10 MG 24 hr tablet Take 10 mg by mouth daily.  02/28/18   [provider]  JANUVIA 100 MG tablet Take 100 mg by mouth daily. 02/24/18   [provider]  LANTUS SOLOSTAR 100 UNIT/ML Solostar Pen INJECT 43 UNITS ONCE A DAY SUBCUTANEOUSLY 02/14/18   [provider]  latanoprost (XALATAN) 0.005 % ophthalmic solution APPLY 1 DROP IN BOTH EYES EVERY NIGHT 01/31/18   [provider]  losartan (COZAAR) 25 MG tablet  03/21/18   [provider]  metFORMIN (GLUCOPHAGE) 500 MG tablet  03/17/18   [provider]  ondansetron (ZOFRAN) 4 MG tablet Take 1 tablet (4 mg total) by mouth every 6 (six) hours. 05/27/21   Couture, Cortni S, PA-C  pravastatin (PRAVACHOL) 20 MG tablet Take 20 mg by mouth daily. 02/03/18   [provider]  Semaglutide (OZEMPIC, 2 MG/DOSE, [place]) Inject into the skin once a week.    [provider]  sildenafil (VIAGRA) 100 MG tablet Take 100 mg by mouth daily as needed for erectile dysfunction.    [provider]  timolol (TIMOPTIC) 0.5 % ophthalmic solution INSTILL 1 DROP INTO BOTH EYES TWICE A DAY 01/07/18   [provider]    Allergies    Versed [midazolam]  Review of Systems  Review of Systems  All other systems reviewed and are negative.  Physical Exam Updated Vital Signs BP (!) 142/70   Pulse (!) 112   Temp (!) 97.5 F (36.4 C) (Oral)   Resp (!) 34   Ht 1.651 m (5\' 5" )   Wt 80.9 kg   SpO2 96%   BMI 29.67 kg/m   Physical Exam Vitals and nursing note reviewed.  Constitutional:      General: He is not in acute distress.    Appearance: Normal appearance. He is well-developed. He is not toxic-appearing.  HENT:     Head: Normocephalic and atraumatic.  Eyes:     General: Lids are normal.     Conjunctiva/sclera: Conjunctivae normal.     Pupils: Pupils are equal, round, and reactive to light.  Neck:     Thyroid: No thyroid mass.     Trachea: No tracheal deviation.  Cardiovascular:     Rate and Rhythm: Regular rhythm. Tachycardia  present.     Heart sounds: Normal heart sounds. No murmur heard.   No gallop.  Pulmonary:     Effort: Pulmonary effort is normal. No respiratory distress.     Breath sounds: No stridor. Examination of the right-lower field reveals decreased breath sounds and wheezing. Decreased breath sounds and wheezing present. No rhonchi or rales.  Abdominal:     General: There is no distension.     Palpations: Abdomen is soft.     Tenderness: There is no abdominal tenderness. There is no rebound.  Musculoskeletal:        General: No tenderness. Normal range of motion.     Cervical back: Normal range of motion and neck supple.  Skin:    General: Skin is warm and dry.     Findings: No abrasion or rash.  Neurological:     Mental Status: He is alert and oriented to person, place, and time. Mental status is at baseline.     GCS: GCS eye subscore is 4. GCS verbal subscore is 5. GCS motor subscore is 6.     Cranial Nerves: No cranial nerve deficit.     Sensory: No sensory deficit.     Motor: Motor function is intact.  Psychiatric:        Attention and Perception: Attention normal.        Speech: Speech normal.        Behavior: Behavior normal.    ED Results / Procedures / Treatments   Labs (all labs ordered are listed, but only abnormal results are displayed) Labs Reviewed  COMPREHENSIVE METABOLIC PANEL - Abnormal; Notable for the following components:      Result Value   Sodium 131 (*)    Chloride 97 (*)    Glucose, Bld 450 (*)    BUN 38 (*)    Creatinine, Ser 1.60 (*)    Albumin 3.2 (*)    AST 86 (*)    ALT 162 (*)    GFR, Estimated 45 (*)    All other components within normal limits  CBC WITH DIFFERENTIAL/PLATELET - Abnormal; Notable for the following components:   WBC 19.5 (*)    Neutro Abs 15.4 (*)    Monocytes Absolute 1.8 (*)    All other components within normal limits  RESP PANEL BY RT-PCR (FLU A&B, COVID) ARPGX2  CULTURE, BLOOD (ROUTINE X 2)  CULTURE, BLOOD (ROUTINE X 2)   LACTIC ACID, PLASMA    EKG None  Radiology DG Chest 2 View  Result Date:  05/30/2021 CLINICAL DATA:  Abnormal breath sounds. EXAM: CHEST - 2 VIEW COMPARISON:  05/27/2021 FINDINGS: Right base collapse/consolidation with small right pleural effusion noted. Similar asymmetric elevation right hemidiaphragm. Left lung clear. No pulmonary edema or evidence of pneumothorax. The cardiopericardial silhouette is within normal limits for size. The visualized bony structures of the thorax show no acute abnormality. IMPRESSION: Right base collapse/consolidation with small right pleural effusion. Electronically Signed   By: Kennith Center M.D.   On: 05/30/2021 10:25    Procedures Procedures   Medications Ordered in ED Medications  lactated ringers infusion (has no administration in time range)  lactated ringers bolus 1,000 mL (has no administration in time range)  cefTRIAXone (ROCEPHIN) 1 g in sodium chloride 0.9 % 100 mL IVPB (has no administration in time range)  azithromycin (ZITHROMAX) 500 mg in sodium chloride 0.9 % 250 mL IVPB (has no administration in time range)  insulin aspart (novoLOG) injection 10 Units (has no administration in time range)    ED Course  I have reviewed the triage vital signs and the nursing notes.  Pertinent labs & imaging results that were available during my care of the patient were reviewed by me and considered in my medical decision making (see chart for details).    MDM Rules/Calculators/A&P                            Final Clinical Impression(s) / ED Diagnoses Final diagnoses:  None  Patient with evidence of pneumonia on chest x-ray today.  He is COVID and flu negative.  Moderate leukocytosis noted does have evidence of acute kidney injury as well as hyperglycemia.  Given IV fluids and insulin here.  Started on IV antibiotics.  Will admit to the hospital service  Rx / DC Orders ED Discharge Orders     None        Lorre Nick, MD 05/30/21  2213

## 2021-05-30 NOTE — ED Provider Notes (Signed)
Emergency Medicine Provider Triage Evaluation Note  Andrew Duncan , a 75 y.o. male  was evaluated in triage.  Pt complains of Abnormal x-ray.  Had a radiograph done earlier, diagnosed with right-sided consolidation consistent with pneumonia and told to go to the ED for IV antibiotics due to concerns about abnormal lab values such as impaired liver function and low white count per the patient.  Patient was seen 05/27/2021 for nausea and vomiting, denied abdominal pain at the time the CT abdomen and pelvis was performed without any acute abnormalities other than cholelithiasis.  Currently having right-sided abdominal pain constipation, he is passing gas. .  Review of Systems  Positive: above Negative: Chest pain  Physical Exam  BP 124/65 (BP Location: Left Arm)   Pulse (!) 120   Temp (!) 97.5 F (36.4 C) (Oral)   Ht 5\' 5"  (1.651 m)   Wt 80.9 kg   SpO2 95%   BMI 29.67 kg/m  Gen:   Awake, no distress   Resp:  Normal effort  MSK:   Moves extremities without difficulty  Other:  Tachycardic, lung sounds are somewhat diminished on the right  Medical Decision Making  Medically screening exam initiated at 3:12 PM.  Appropriate orders placed.  was informed that the remainder of the evaluation will be completed by another provider, this initial triage assessment does not replace that evaluation, and the importance of remaining in the ED until their evaluation is complete.  PNA, possible admission.    Ninfa Meeker, PA-C 05/30/21 1516    06/01/21, MD 05/31/21 2322

## 2021-05-30 NOTE — ED Notes (Signed)
ED TO INPATIENT HANDOFF REPORT  ED Nurse Name and Phone #:   S Name/Age/Gender Andrew Duncan 75 y.o. male Room/Bed: WA05/WA05  Code Status   Code Status: Not on file  Home/SNF/Other Home Patient oriented to: self, place, time, and situation Is this baseline? Yes   Triage Complete: Triage complete  Chief Complaint CAP (community acquired pneumonia) [J18.9]  Triage Note Patient reports that he was diagnosed with pneumonia today and was told to come to the ED for IV antibiotics and to be admitted.  Patient also c/o right abdominal pain and states no BM in 2 days.   Allergies Allergies  Allergen Reactions   Versed [Midazolam]     Level of Care/Admitting Diagnosis ED Disposition     ED Disposition  Admit   Condition  --   Comment  Hospital Area: [facility] [[id number]]  Level of Care: Progressive [102]  Admit to Progressive based on following criteria: MULTISYSTEM THREATS such as stable sepsis, metabolic/electrolyte imbalance with or without encephalopathy that is responding to early treatment.  May place patient in observation at [facility] or [facility] if equivalent level of care is available:: No  Covid Evaluation: Confirmed COVID Negative  Diagnosis: CAP (community acquired pneumonia) [737106]  Admitting Physician: Eduard Clos [phone]  Attending Physician: Eduard Clos [id number]          B Medical/Surgery History Past Medical History:  Diagnosis Date   Anemia    Childhood asthma    Diabetes (HCC)    Essential hypertension    Hypercholesterolemia    Hyperlipidemia    Polyneuropathy    Retinopathy    Past Surgical History:  Procedure Laterality Date   ANAL FISSURE REPAIR     left elbow fracture pinning     pinning     A IV Location/Drains/Wounds Patient Lines/Drains/Airways Status     Active Line/Drains/Airways     Name Placement date Placement time Site Days   Peripheral IV 05/30/21 22 G Left Antecubital  05/30/21  2247  Antecubital  less than 1   Peripheral IV 05/30/21 22 G Right Antecubital 05/30/21  2247  Antecubital  less than 1            Intake/Output Last 24 hours No intake or output data in the 24 hours ending 05/30/21 2306  Labs/Imaging Results for orders placed or performed during the hospital encounter of 05/30/21 (from the past 48 hour(s))  Comprehensive metabolic panel     Status: Abnormal   Collection Time: 05/30/21  6:04 PM  Result Value Ref Range   Sodium 131 (L) 135 - 145 mmol/L   Potassium 4.0 3.5 - 5.1 mmol/L   Chloride 97 (L) 98 - 111 mmol/L   CO2 22 22 - 32 mmol/L   Glucose, Bld 450 (H) 70 - 99 mg/dL    Comment: Glucose reference range applies only to samples taken after fasting for at least 8 hours.   BUN 38 (H) 8 - 23 mg/dL   Creatinine, Ser 2.70 (H) 0.61 - 1.24 mg/dL   Calcium 9.1 8.9 - 35.0 mg/dL   Total Protein 7.1 6.5 - 8.1 g/dL   Albumin 3.2 (L) 3.5 - 5.0 g/dL   AST 86 (H) 15 - 41 U/L   ALT 162 (H) 0 - 44 U/L   Alkaline Phosphatase 111 38 - 126 U/L   Total Bilirubin 0.9 0.3 - 1.2 mg/dL   GFR, Estimated 45 (L) >60 mL/min    Comment: (NOTE) Calculated using  the CKD-EPI Creatinine Equation (2021)    Anion gap 12 5 - 15    Comment: Performed at [facility], [address] [place], [place] [address]  CBC with Differential     Status: Abnormal   Collection Time: 05/30/21  6:04 PM  Result Value Ref Range   WBC 19.5 (H) 4.0 - 10.5 K/uL   RBC 4.36 4.22 - 5.81 MIL/uL   Hemoglobin 13.2 13.0 - 17.0 g/dL   HCT 30.8 65.7 - 84.6 %   MCV 92.2 80.0 - 100.0 fL   MCH 30.3 26.0 - 34.0 pg   MCHC 32.8 30.0 - 36.0 g/dL   RDW 96.2 95.2 - 84.1 %   Platelets 274 150 - 400 K/uL   nRBC 0.0 0.0 - 0.2 %   Neutrophils Relative % 77 %   Neutro Abs 15.4 (H) 1.7 - 7.7 K/uL   Band Neutrophils 2 %   Lymphocytes Relative 11 %   Lymphs Abs 2.1 0.7 - 4.0 K/uL   Monocytes Relative 9 %   Monocytes Absolute 1.8 (H) 0.1 - 1.0 K/uL   Eosinophils Relative  1 %   Eosinophils Absolute 0.2 0.0 - 0.5 K/uL   Basophils Relative 0 %   Basophils Absolute 0.0 0.0 - 0.1 K/uL   WBC Morphology MORPHOLOGY UNREMARKABLE    RBC Morphology MORPHOLOGY UNREMARKABLE    Smear Review MORPHOLOGY UNREMARKABLE    Abs Immature Granulocytes 0.00 0.00 - 0.07 K/uL    Comment: Performed at [facility], [address] [place], [place] [address]  Resp Panel by RT-PCR (Flu A&B, Covid) Nasopharyngeal Swab     Status: None   Collection Time: 05/30/21  6:05 PM   Specimen: Nasopharyngeal Swab; Nasopharyngeal(NP) swabs in vial transport medium  Result Value Ref Range   SARS Coronavirus 2 by RT PCR NEGATIVE NEGATIVE    Comment: (NOTE) SARS-CoV-2 target nucleic acids are NOT DETECTED.  The SARS-CoV-2 RNA is generally detectable in upper respiratory specimens during the acute phase of infection. The lowest concentration of SARS-CoV-2 viral copies this assay can detect is 138 copies/mL. A negative result does not preclude SARS-Cov-2 infection and should not be used as the sole basis for treatment or other patient management decisions. A negative result may occur with  improper specimen collection/handling, submission of specimen other than nasopharyngeal swab, presence of viral mutation(s) within the areas targeted by this assay, and inadequate number of viral copies(<138 copies/mL). A negative result must be combined with clinical observations, patient history, and epidemiological information. The expected result is Negative.  Fact Sheet for Patients:  BloggerCourse.com  Fact Sheet for Healthcare Providers:  SeriousBroker.it  This test is no t yet approved or cleared by the [place] FDA and  has been authorized for detection and/or diagnosis of SARS-CoV-2 by FDA under an Emergency Use Authorization (EUA). This EUA will remain  in effect (meaning this test can be used) for the duration of  the COVID-19 declaration under Section 564(b)(1) of the Act, 21 U.S.C.section 360bbb-3(b)(1), unless the authorization is terminated  or revoked sooner.       Influenza A by PCR NEGATIVE NEGATIVE   Influenza B by PCR NEGATIVE NEGATIVE    Comment: (NOTE) The Xpert Xpress SARS-CoV-2/FLU/RSV plus assay is intended as an aid in the diagnosis of influenza from Nasopharyngeal swab specimens and should not be used as a sole basis for treatment. Nasal washings and aspirates are unacceptable for Xpert Xpress SARS-CoV-2/FLU/RSV testing.  Fact Sheet for Patients: BloggerCourse.com  Fact Sheet for Healthcare Providers: SeriousBroker.it  This test is not yet approved or cleared by the [place] FDA and has been authorized for detection and/or diagnosis of SARS-CoV-2 by FDA under an Emergency Use Authorization (EUA). This EUA will remain in effect (meaning this test can be used) for the duration of the COVID-19 declaration under Section 564(b)(1) of the Act, 21 U.S.C. section 360bbb-3(b)(1), unless the authorization is terminated or revoked.  Performed at [facility], [address], [place], [place] [address]    DG Chest 2 View  Result Date: 05/30/2021 CLINICAL DATA:  Abnormal breath sounds. EXAM: CHEST - 2 VIEW COMPARISON:  05/27/2021 FINDINGS: Right base collapse/consolidation with small right pleural effusion noted. Similar asymmetric elevation right hemidiaphragm. Left lung clear. No pulmonary edema or evidence of pneumothorax. The cardiopericardial silhouette is within normal limits for size. The visualized bony structures of the thorax show no acute abnormality. IMPRESSION: Right base collapse/consolidation with small right pleural effusion. Electronically Signed   By: Kennith Center M.D.   On: 05/30/2021 10:25    Pending Labs Unresulted Labs (From admission, onward)     Start     Ordered   05/30/21 2203   Culture, blood (Routine X 2) w Reflex to ID Panel  BLOOD CULTURE X 2,   R (with STAT occurrences)      05/30/21 2202   05/30/21 2203  Lactic acid, plasma  Once,   STAT        05/30/21 2202            Vitals/Pain Today's Vitals   05/30/21 1844 05/30/21 2154 05/30/21 2200 05/30/21 2215  BP: 126/73 (!) 142/70 130/68 135/68  Pulse: 80 (!) 112 (!) 105   Resp: (!) 24 (!) 34 (!) 28 (!) 35  Temp:      TempSrc:      SpO2: 96% 96% 97%   Weight:      Height:      PainSc:        Isolation Precautions No active isolations  Medications Medications  lactated ringers infusion ( Intravenous New Bag/Given 05/30/21 2253)  cefTRIAXone (ROCEPHIN) 1 g in sodium chloride 0.9 % 100 mL IVPB (1 g Intravenous New Bag/Given 05/30/21 2256)  azithromycin (ZITHROMAX) 500 mg in sodium chloride 0.9 % 250 mL IVPB (500 mg Intravenous New Bag/Given 05/30/21 2303)  lactated ringers bolus 1,000 mL (1,000 mLs Intravenous New Bag/Given 05/30/21 2248)  insulin aspart (novoLOG) injection 10 Units (10 Units Subcutaneous Given 05/30/21 2249)    Mobility walks Low fall risk   Focused Assessments    R Recommendations: See Admitting Provider Note  Report given to:   Additional Notes:

## 2021-05-31 ENCOUNTER — Observation Stay (HOSPITAL_COMMUNITY): Payer: Medicare Other

## 2021-05-31 ENCOUNTER — Encounter (HOSPITAL_COMMUNITY): Payer: Self-pay | Admitting: Internal Medicine

## 2021-05-31 DIAGNOSIS — E11649 Type 2 diabetes mellitus with hypoglycemia without coma: Secondary | ICD-10-CM | POA: Diagnosis not present

## 2021-05-31 DIAGNOSIS — K819 Cholecystitis, unspecified: Secondary | ICD-10-CM | POA: Diagnosis not present

## 2021-05-31 DIAGNOSIS — E1165 Type 2 diabetes mellitus with hyperglycemia: Secondary | ICD-10-CM | POA: Diagnosis present

## 2021-05-31 DIAGNOSIS — A419 Sepsis, unspecified organism: Principal | ICD-10-CM

## 2021-05-31 DIAGNOSIS — E876 Hypokalemia: Secondary | ICD-10-CM | POA: Diagnosis present

## 2021-05-31 DIAGNOSIS — R932 Abnormal findings on diagnostic imaging of liver and biliary tract: Secondary | ICD-10-CM | POA: Diagnosis not present

## 2021-05-31 DIAGNOSIS — E871 Hypo-osmolality and hyponatremia: Secondary | ICD-10-CM | POA: Diagnosis present

## 2021-05-31 DIAGNOSIS — I10 Essential (primary) hypertension: Secondary | ICD-10-CM | POA: Diagnosis present

## 2021-05-31 DIAGNOSIS — R7989 Other specified abnormal findings of blood chemistry: Secondary | ICD-10-CM | POA: Diagnosis not present

## 2021-05-31 DIAGNOSIS — Z794 Long term (current) use of insulin: Secondary | ICD-10-CM

## 2021-05-31 DIAGNOSIS — R7401 Elevation of levels of liver transaminase levels: Secondary | ICD-10-CM | POA: Diagnosis not present

## 2021-05-31 DIAGNOSIS — K810 Acute cholecystitis: Secondary | ICD-10-CM | POA: Diagnosis present

## 2021-05-31 DIAGNOSIS — Z7984 Long term (current) use of oral hypoglycemic drugs: Secondary | ICD-10-CM | POA: Diagnosis not present

## 2021-05-31 DIAGNOSIS — J9601 Acute respiratory failure with hypoxia: Secondary | ICD-10-CM | POA: Diagnosis present

## 2021-05-31 DIAGNOSIS — E7800 Pure hypercholesterolemia, unspecified: Secondary | ICD-10-CM | POA: Diagnosis present

## 2021-05-31 DIAGNOSIS — J189 Pneumonia, unspecified organism: Secondary | ICD-10-CM

## 2021-05-31 DIAGNOSIS — Z79899 Other long term (current) drug therapy: Secondary | ICD-10-CM | POA: Diagnosis not present

## 2021-05-31 DIAGNOSIS — J918 Pleural effusion in other conditions classified elsewhere: Secondary | ICD-10-CM | POA: Diagnosis present

## 2021-05-31 DIAGNOSIS — R188 Other ascites: Secondary | ICD-10-CM | POA: Diagnosis present

## 2021-05-31 DIAGNOSIS — N179 Acute kidney failure, unspecified: Secondary | ICD-10-CM | POA: Diagnosis present

## 2021-05-31 DIAGNOSIS — Z20822 Contact with and (suspected) exposure to covid-19: Secondary | ICD-10-CM | POA: Diagnosis present

## 2021-05-31 DIAGNOSIS — K828 Other specified diseases of gallbladder: Secondary | ICD-10-CM | POA: Diagnosis not present

## 2021-05-31 DIAGNOSIS — D72829 Elevated white blood cell count, unspecified: Secondary | ICD-10-CM | POA: Diagnosis not present

## 2021-05-31 DIAGNOSIS — R609 Edema, unspecified: Secondary | ICD-10-CM | POA: Diagnosis not present

## 2021-05-31 DIAGNOSIS — Z7982 Long term (current) use of aspirin: Secondary | ICD-10-CM | POA: Diagnosis not present

## 2021-05-31 DIAGNOSIS — K7689 Other specified diseases of liver: Secondary | ICD-10-CM | POA: Diagnosis not present

## 2021-05-31 DIAGNOSIS — K750 Abscess of liver: Secondary | ICD-10-CM | POA: Diagnosis present

## 2021-05-31 DIAGNOSIS — R1011 Right upper quadrant pain: Secondary | ICD-10-CM | POA: Diagnosis not present

## 2021-05-31 LAB — CBC WITH DIFFERENTIAL/PLATELET
Abs Immature Granulocytes: 0.16 10*3/uL — ABNORMAL HIGH (ref 0.00–0.07)
Basophils Absolute: 0 10*3/uL (ref 0.0–0.1)
Basophils Relative: 0 %
Eosinophils Absolute: 0.1 10*3/uL (ref 0.0–0.5)
Eosinophils Relative: 0 %
HCT: 34.7 % — ABNORMAL LOW (ref 39.0–52.0)
Hemoglobin: 11.6 g/dL — ABNORMAL LOW (ref 13.0–17.0)
Immature Granulocytes: 1 %
Lymphocytes Relative: 7 %
Lymphs Abs: 1.1 10*3/uL (ref 0.7–4.0)
MCH: 30.5 pg (ref 26.0–34.0)
MCHC: 33.4 g/dL (ref 30.0–36.0)
MCV: 91.3 fL (ref 80.0–100.0)
Monocytes Absolute: 2.2 10*3/uL — ABNORMAL HIGH (ref 0.1–1.0)
Monocytes Relative: 14 %
Neutro Abs: 12.3 10*3/uL — ABNORMAL HIGH (ref 1.7–7.7)
Neutrophils Relative %: 78 %
Platelets: 232 10*3/uL (ref 150–400)
RBC: 3.8 MIL/uL — ABNORMAL LOW (ref 4.22–5.81)
RDW: 13.2 % (ref 11.5–15.5)
WBC: 15.8 10*3/uL — ABNORMAL HIGH (ref 4.0–10.5)
nRBC: 0 % (ref 0.0–0.2)

## 2021-05-31 LAB — COMPREHENSIVE METABOLIC PANEL
ALT: 138 U/L — ABNORMAL HIGH (ref 0–44)
AST: 71 U/L — ABNORMAL HIGH (ref 15–41)
Albumin: 2.7 g/dL — ABNORMAL LOW (ref 3.5–5.0)
Alkaline Phosphatase: 103 U/L (ref 38–126)
Anion gap: 7 (ref 5–15)
BUN: 40 mg/dL — ABNORMAL HIGH (ref 8–23)
CO2: 24 mmol/L (ref 22–32)
Calcium: 8.5 mg/dL — ABNORMAL LOW (ref 8.9–10.3)
Chloride: 101 mmol/L (ref 98–111)
Creatinine, Ser: 1.57 mg/dL — ABNORMAL HIGH (ref 0.61–1.24)
GFR, Estimated: 46 mL/min — ABNORMAL LOW (ref >60)
Glucose, Bld: 266 mg/dL — ABNORMAL HIGH (ref 70–99)
Potassium: 3.6 mmol/L (ref 3.5–5.1)
Sodium: 132 mmol/L — ABNORMAL LOW (ref 135–145)
Total Bilirubin: 0.6 mg/dL (ref 0.3–1.2)
Total Protein: 6.3 g/dL — ABNORMAL LOW (ref 6.5–8.1)

## 2021-05-31 LAB — LACTIC ACID, PLASMA
Lactic Acid, Venous: 1.6 mmol/L (ref 0.5–1.9)
Lactic Acid, Venous: 1.8 mmol/L (ref 0.5–1.9)

## 2021-05-31 LAB — GLUCOSE, CAPILLARY
Glucose-Capillary: 168 mg/dL — ABNORMAL HIGH (ref 70–99)
Glucose-Capillary: 188 mg/dL — ABNORMAL HIGH (ref 70–99)
Glucose-Capillary: 141 mg/dL — ABNORMAL HIGH (ref 70–99)
Glucose-Capillary: 234 mg/dL — ABNORMAL HIGH (ref 70–99)

## 2021-05-31 MED ORDER — PRAVASTATIN SODIUM 20 MG PO TABS: 20.0000 mg | ORAL_TABLET | Freq: Every day | ORAL | Status: DC

## 2021-05-31 MED ORDER — INSULIN ASPART 100 UNIT/ML IJ SOLN
0.0000 [IU] | Freq: Three times a day (TID) | INTRAMUSCULAR | Status: DC
  Administered 2021-05-31 (×3): 2 [IU] via SUBCUTANEOUS
  Administered 2021-06-01: 5 [IU] via SUBCUTANEOUS
  Administered 2021-06-01: 3 [IU] via SUBCUTANEOUS

## 2021-05-31 MED ORDER — INSULIN GLARGINE-YFGN 100 UNIT/ML ~~LOC~~ SOLN
37.0000 [IU] | Freq: Every day | SUBCUTANEOUS | Status: DC
  Administered 2021-05-31 (×2): 37 [IU] via SUBCUTANEOUS
  Filled 2021-05-31 (×3): qty 0.37

## 2021-05-31 MED ORDER — ACETAMINOPHEN 325 MG PO TABS
650.0000 mg | ORAL_TABLET | Freq: Four times a day (QID) | ORAL | Status: DC | PRN
  Administered 2021-05-31 – 2021-06-01 (×2): 650 mg via ORAL
  Filled 2021-05-31 (×2): qty 2

## 2021-05-31 MED ORDER — HYDRALAZINE HCL 20 MG/ML IJ SOLN
10.0000 mg | INTRAMUSCULAR | Status: DC | PRN
  Administered 2021-05-31 – 2021-06-01 (×5): 10 mg via INTRAVENOUS
  Filled 2021-05-31 (×5): qty 1

## 2021-05-31 MED ORDER — ACETAMINOPHEN 650 MG RE SUPP: 650.0000 mg | Freq: Four times a day (QID) | RECTAL | Status: DC | PRN

## 2021-05-31 MED ORDER — LOSARTAN POTASSIUM 25 MG PO TABS: 25.0000 mg | ORAL_TABLET | Freq: Every day | ORAL | Status: DC

## 2021-05-31 MED ORDER — TIMOLOL MALEATE 0.5 % OP SOLN
1.0000 [drp] | Freq: Two times a day (BID) | OPHTHALMIC | Status: DC
Start: 2021-05-31 — End: 2021-06-05
  Administered 2021-05-31 – 2021-06-05 (×11): 1 [drp] via OPHTHALMIC
  Filled 2021-05-31: qty 5

## 2021-05-31 MED ORDER — SODIUM CHLORIDE 0.9 % IV SOLN
2.0000 g | INTRAVENOUS | Status: DC
  Administered 2021-05-31: 2 g via INTRAVENOUS
  Filled 2021-05-31 (×2): qty 20

## 2021-05-31 MED ORDER — ENOXAPARIN SODIUM 40 MG/0.4ML IJ SOSY
40.0000 mg | PREFILLED_SYRINGE | INTRAMUSCULAR | Status: DC
  Administered 2021-05-31 – 2021-06-01 (×2): 40 mg via SUBCUTANEOUS
  Filled 2021-05-31 (×2): qty 0.4

## 2021-05-31 MED ORDER — IPRATROPIUM-ALBUTEROL 0.5-2.5 (3) MG/3ML IN SOLN
3.0000 mL | Freq: Four times a day (QID) | RESPIRATORY_TRACT | Status: DC | PRN
  Administered 2021-05-31: 3 mL via RESPIRATORY_TRACT
  Filled 2021-05-31: qty 3

## 2021-05-31 MED ORDER — ASPIRIN EC 81 MG PO TBEC
81.0000 mg | DELAYED_RELEASE_TABLET | Freq: Every day | ORAL | Status: DC
  Administered 2021-05-31 – 2021-06-05 (×6): 81 mg via ORAL
  Filled 2021-05-31 (×6): qty 1

## 2021-05-31 MED ORDER — LATANOPROST 0.005 % OP SOLN
1.0000 [drp] | Freq: Every day | OPHTHALMIC | Status: DC
  Administered 2021-05-31 – 2021-06-04 (×5): 1 [drp] via OPHTHALMIC
  Filled 2021-05-31: qty 2.5

## 2021-05-31 MED ORDER — LACTATED RINGERS IV SOLN: INTRAVENOUS | Status: DC

## 2021-05-31 NOTE — Plan of Care (Signed)
  Problem: Clinical Measurements: Goal: Will remain free from infection Outcome: Not Progressing Goal: Diagnostic test results will improve Outcome: Not Progressing   Problem: Activity: Goal: Risk for activity intolerance will decrease Outcome: Not Progressing   Problem: Education: Goal: Knowledge of General Education information will improve Description: Including pain rating scale, medication(s)/side effects and non-pharmacologic comfort measures Outcome: Progressing   Problem: Health Behavior/Discharge Planning: Goal: Ability to manage health-related needs will improve Outcome: Progressing   Problem: Clinical Measurements: Goal: Ability to maintain clinical measurements within normal limits will improve Outcome: Progressing Goal: Respiratory complications will improve Outcome: Progressing   Problem: Nutrition: Goal: Adequate nutrition will be maintained Outcome: Progressing   Problem: Coping: Goal: Level of anxiety will decrease Outcome: Progressing   Problem: Elimination: Goal: Will not experience complications related to bowel motility Outcome: Progressing Goal: Will not experience complications related to urinary retention Outcome: Progressing   Problem: Skin Integrity: Goal: Risk for impaired skin integrity will decrease Outcome: Progressing

## 2021-05-31 NOTE — Progress Notes (Signed)
[facility] PLAN OF CARE NOTE Patient: Andrew Duncan APO:[id number]   PCP: Renford Dills, MD DOB: 1946/06/20   DOA: 05/30/2021   DOS: 05/31/2021    Patient was admitted by my colleague earlier on 05/31/2021. I have reviewed the H&P as well as assessment and plan and agree with the same. Important changes in the plan are listed below.  Plan of care: Principal Problem:   Sepsis (HCC) Active Problems:   CAP (community acquired pneumonia)   Type 2 diabetes mellitus with hyperglycemia (HCC)   Essential hypertension    Will perform another chest x-ray tomorrow, decubitus view to monitor for pleural effusion.  Author: Lynden Oxford, MD [facility] 05/31/2021 7:03 PM   If 7PM-7AM, please contact night-coverage at [email or web address]

## 2021-05-31 NOTE — TOC Progression Note (Signed)
Transition of Care [facility]) - Progression Note    Patient Details  Name: Andrew Duncan MRN: [id number] Date of Birth: 1946/01/09  [facility] CM/SW Contact  Geni Bers, RN Phone Number: 05/31/2021, 11:02 AM  Clinical Narrative:     Chart reviewed by TOC. Will continue to follow for discharge needs.   Expected Discharge Plan: Home/Self Care Barriers to Discharge: No Barriers Identified  Expected Discharge Plan and Services Expected Discharge Plan: Home/Self Care       Living arrangements for the past 2 months: Single Family Home                                       Social Determinants of Health (SDOH) Interventions    Readmission Risk Interventions No flowsheet data found.

## 2021-05-31 NOTE — H&P (Signed)
History and Physical    [employer]:[id number] DOB: 02-04-1946 DOA: 05/30/2021  PCP: Renford Dills, MD  Patient coming from: Home.  Chief Complaint: Right-sided chest pain.  HPI: Andrew Duncan is a 75 y.o. male with history of diabetes mellitus type 2, hypertension, hyperlipidemia had come to the ER on November 26 about 4 days ago after patient was having nausea vomiting.  At that time CT scan and labs did not show anything acute patient was discharged home.  Patient since then has been feeling weak tired and short of breath with productive cough and right-sided chest pain had gone to his PCP yesterday and was referred to the ER.  Patient's right-sided chest pain is pleuritic in nature.  ED Course: In the ER patient had chest x-ray which shows right-sided pneumonia with pleural effusion patient labs also show acute renal failure which has worsened from normal 4 days ago it is around 1.6 blood glucose is 450 sodium 131 AST and ALT also has increased considerably from normal to 86 and 162.  Bilirubin being normal.  COVID test was negative.  Patient started on empiric antibiotics for pneumonia with possible developing sepsis.  Initial lactate was elevated at 2.4.  CBC was 19.5.  Review of Systems: As per HPI, rest all negative.   Past Medical History:  Diagnosis Date   Anemia    Childhood asthma    Diabetes (HCC)    Essential hypertension    Hypercholesterolemia    Hyperlipidemia    Polyneuropathy    Retinopathy     Past Surgical History:  Procedure Laterality Date   ANAL FISSURE REPAIR     left elbow fracture pinning     pinning     reports that he has never smoked. He has never used smokeless tobacco. He reports that he does not currently use alcohol. He reports that he does not use drugs.  Allergies  Allergen Reactions   Versed [Midazolam]     Family History  Problem Relation Age of Onset   Diabetes Mother    Stroke Mother     Prior to Admission medications    Medication Sig Start Date End Date Taking? Authorizing Provider  aspirin EC 81 MG tablet Take 81 mg by mouth daily.   Yes [provider]  cholecalciferol (VITAMIN D) 25 MCG (1000 UNIT) tablet Take 1,000 Units by mouth daily.   Yes [provider]  glipiZIDE (GLUCOTROL XL) 10 MG 24 hr tablet Take 10 mg by mouth daily. 02/28/18  Yes [provider]  JANUVIA 100 MG tablet Take 100 mg by mouth daily. 02/24/18  Yes [provider]  LANTUS SOLOSTAR 100 UNIT/ML Solostar Pen 47 Units at bedtime. 02/14/18  Yes [provider]  latanoprost (XALATAN) 0.005 % ophthalmic solution Place 1 drop into both eyes at bedtime. 01/31/18  Yes [provider]  losartan (COZAAR) 25 MG tablet Take 25 mg by mouth daily. 03/21/18  Yes [provider]  metFORMIN (GLUCOPHAGE) 500 MG tablet Take 500 mg by mouth 2 (two) times daily with a meal. 03/17/18  Yes [provider]  Multiple Vitamin (MULTIVITAMIN WITH MINERALS) TABS tablet Take 1 tablet by mouth daily.   Yes [provider]  pravastatin (PRAVACHOL) 20 MG tablet Take 20 mg by mouth daily. 02/03/18  Yes [provider]  Semaglutide (OZEMPIC, 2 MG/DOSE, [place]) Inject 0.5 mg into the skin once a week. Every Friday   Yes [provider]  timolol (TIMOPTIC) 0.5 %  ophthalmic solution Place 1 drop into both eyes 2 (two) times daily. 01/07/18  Yes [provider]  Albuterol Sulfate (PROAIR HFA IN) Inhale into the lungs. Patient not taking: Reported on 05/30/2021    [provider]  BD PEN NEEDLE NANO U/F 32G X 4 MM MISC USE NEEDLES FOR INJECTION TWICE A DAY 01/30/18   [provider]  DIPHENHYDRAMINE HCL PO Take 10 mg by mouth. Patient not taking: Reported on 05/30/2021    [provider]  ondansetron (ZOFRAN) 4 MG tablet Take 1 tablet (4 mg total) by mouth every 6 (six) hours. Patient not taking: Reported on 05/30/2021 05/27/21   Karrie Meres, PA-C     Physical Exam: Constitutional: Moderately built and nourished. Vitals:   05/30/21 2215 05/30/21 2319 05/30/21 2330 05/31/21 0014  BP: 135/68 (!) 145/72 126/66 (!) 151/73  Pulse:  (!) 112 (!) 111 (!) 108  Resp: (!) 35 (!) 36 (!) 31 18  Temp:  97.6 F (36.4 C)  98.9 F (37.2 C)  TempSrc:  Oral  Oral  SpO2:  96% 93% 97%  Weight:      Height:       Eyes: Anicteric no pallor. ENMT: No discharge from the ears eyes nose and mouth. Neck: No mass felt.  No neck rigidity. Respiratory: No rhonchi or crepitations. Cardiovascular: S1-S2 heard. Abdomen: Soft nontender bowel sound present. Musculoskeletal: No edema. Skin: No rash. Neurologic: Alert awake oriented time place and person.  Moves all extremities. Psychiatric: Appears normal.  Normal affect.   Labs on Admission: I have personally reviewed following labs and imaging studies  CBC: Recent Labs  Lab 05/27/21 1231 05/27/21 1405 05/30/21 1804  WBC 11.6*  --  19.5*  NEUTROABS  --   --  15.4*  HGB 13.6 13.9 13.2  HCT 40.9 41.0 40.2  MCV 91.5  --  92.2  PLT 234  --  274   Basic Metabolic Panel: Recent Labs  Lab 05/27/21 1231 05/27/21 1405 05/30/21 1804  NA 137 137 131*  K 4.1 4.2 4.0  CL 101  --  97*  CO2 26  --  22  GLUCOSE 286*  --  450*  BUN 13  --  38*  CREATININE 0.85  --  1.60*  CALCIUM 9.9  --  9.1   GFR: Estimated Creatinine Clearance: 39.7 mL/min (A) (by C-G formula based on SCr of 1.6 mg/dL (H)). Liver Function Tests: Recent Labs  Lab 05/27/21 1231 05/30/21 1804  AST 16 86*  ALT 20 162*  ALKPHOS 53 111  BILITOT 0.6 0.9  PROT 7.1 7.1  ALBUMIN 4.5 3.2*   Recent Labs  Lab 05/27/21 1231  LIPASE 23   No results for input(s): AMMONIA in the last 168 hours. Coagulation Profile: No results for input(s): INR, PROTIME in the last 168 hours. Cardiac Enzymes: No results for input(s): CKTOTAL, CKMB, CKMBINDEX, TROPONINI in the last 168 hours. BNP (last 3 results) No results for input(s): PROBNP  in the last 8760 hours. HbA1C: No results for input(s): HGBA1C in the last 72 hours. CBG: Recent Labs  Lab 05/27/21 1226  GLUCAP 310*   Lipid Profile: No results for input(s): CHOL, HDL, LDLCALC, TRIG, CHOLHDL, LDLDIRECT in the last 72 hours. Thyroid Function Tests: No results for input(s): TSH, T4TOTAL, FREET4, T3FREE, THYROIDAB in the last 72 hours. Anemia Panel: No results for input(s): VITAMINB12, FOLATE, FERRITIN, TIBC, IRON, RETICCTPCT in the last 72 hours. Urine analysis:    Component Value Date/Time   COLORURINE  YELLOW 05/27/2021 1231   APPEARANCEUR CLEAR 05/27/2021 1231   LABSPEC 1.034 (H) 05/27/2021 1231   PHURINE 5.0 05/27/2021 1231   GLUCOSEU >1,000 (A) 05/27/2021 1231   HGBUR NEGATIVE 05/27/2021 1231   BILIRUBINUR NEGATIVE 05/27/2021 1231   KETONESUR TRACE (A) 05/27/2021 1231   PROTEINUR TRACE (A) 05/27/2021 1231   NITRITE NEGATIVE 05/27/2021 1231   LEUKOCYTESUR NEGATIVE 05/27/2021 1231   Sepsis Labs: @LABRCNTIP (procalcitonin:4,lacticidven:4) ) Recent Results (from the past 240 hour(s))  Resp Panel by RT-PCR (Flu A&B, Covid) Urine, Clean Catch     Status: None   Collection Time: 05/27/21 12:31 PM   Specimen: Urine, Clean Catch; Nasopharyngeal(NP) swabs in vial transport medium  Result Value Ref Range Status   SARS Coronavirus 2 by RT PCR NEGATIVE NEGATIVE Final    Comment: (NOTE) SARS-CoV-2 target nucleic acids are NOT DETECTED.  The SARS-CoV-2 RNA is generally detectable in upper respiratory specimens during the acute phase of infection. The lowest concentration of SARS-CoV-2 viral copies this assay can detect is 138 copies/mL. A negative result does not preclude SARS-Cov-2 infection and should not be used as the sole basis for treatment or other patient management decisions. A negative result may occur with  improper specimen collection/handling, submission of specimen other than nasopharyngeal swab, presence of viral mutation(s) within the areas  targeted by this assay, and inadequate number of viral copies(<138 copies/mL). A negative result must be combined with clinical observations, patient history, and epidemiological information. The expected result is Negative.  Fact Sheet for Patients:  05/29/21  Fact Sheet for Healthcare Providers:  BloggerCourse.com  This test is no t yet approved or cleared by the [place] FDA and  has been authorized for detection and/or diagnosis of SARS-CoV-2 by FDA under an Emergency Use Authorization (EUA). This EUA will remain  in effect (meaning this test can be used) for the duration of the COVID-19 declaration under Section 564(b)(1) of the Act, 21 U.S.C.section 360bbb-3(b)(1), unless the authorization is terminated  or revoked sooner.       Influenza A by PCR NEGATIVE NEGATIVE Final   Influenza B by PCR NEGATIVE NEGATIVE Final    Comment: (NOTE) The Xpert Xpress SARS-CoV-2/FLU/RSV plus assay is intended as an aid in the diagnosis of influenza from Nasopharyngeal swab specimens and should not be used as a sole basis for treatment. Nasal washings and aspirates are unacceptable for Xpert Xpress SARS-CoV-2/FLU/RSV testing.  Fact Sheet for Patients: [place]  Fact Sheet for Healthcare Providers: BloggerCourse.com  This test is not yet approved or cleared by the [place] FDA and has been authorized for detection and/or diagnosis of SARS-CoV-2 by FDA under an Emergency Use Authorization (EUA). This EUA will remain in effect (meaning this test can be used) for the duration of the COVID-19 declaration under Section 564(b)(1) of the Act, 21 U.S.C. section 360bbb-3(b)(1), unless the authorization is terminated or revoked.  Performed at [facility], [address] [place], [place]   Resp Panel by RT-PCR (Flu A&B, Covid) Nasopharyngeal  Swab     Status: None   Collection Time: 05/30/21  6:05 PM   Specimen: Nasopharyngeal Swab; Nasopharyngeal(NP) swabs in vial transport medium  Result Value Ref Range Status   SARS Coronavirus 2 by RT PCR NEGATIVE NEGATIVE Final    Comment: (NOTE) SARS-CoV-2 target nucleic acids are NOT DETECTED.  The SARS-CoV-2 RNA is generally detectable in upper respiratory specimens during the acute phase of infection. The lowest concentration of SARS-CoV-2 viral copies this assay can detect is  138 copies/mL. A negative result does not preclude SARS-Cov-2 infection and should not be used as the sole basis for treatment or other patient management decisions. A negative result may occur with  improper specimen collection/handling, submission of specimen other than nasopharyngeal swab, presence of viral mutation(s) within the areas targeted by this assay, and inadequate number of viral copies(<138 copies/mL). A negative result must be combined with clinical observations, patient history, and epidemiological information. The expected result is Negative.  Fact Sheet for Patients:  BloggerCourse.com  Fact Sheet for Healthcare Providers:  [employer]ious[employer].it  This test is no t yet approved or cleared by the [place] FDA and  has been authorized for detection and/or diagnosis of SARS-CoV-2 by FDA under an Emergency Use Authorization (EUA). This EUA will remain  in effect (meaning this test can be used) for the duration of the COVID-19 declaration under Section 564(b)(1) of the Act, 21 U.S.C.section 360bbb-3(b)(1), unless the authorization is terminated  or revoked sooner.       Influenza A by PCR NEGATIVE NEGATIVE Final   Influenza B by PCR NEGATIVE NEGATIVE Final    Comment: (NOTE) The Xpert Xpress SARS-CoV-2/FLU/RSV plus assay is intended as an aid in the diagnosis of influenza from Nasopharyngeal swab specimens and should not be used as a sole  basis for treatment. Nasal washings and aspirates are unacceptable for Xpert Xpress SARS-CoV-2/FLU/RSV testing.  Fact Sheet for Patients: BloggerCourse.com  Fact Sheet for Healthcare Providers: SeriousBroker.it  This test is not yet approved or cleared by the [place] FDA and has been authorized for detection and/or diagnosis of SARS-CoV-2 by FDA under an Emergency Use Authorization (EUA). This EUA will remain in effect (meaning this test can be used) for the duration of the COVID-19 declaration under Section 564(b)(1) of the Act, 21 U.S.C. section 360bbb-3(b)(1), unless the authorization is terminated or revoked.  Performed at [facility], [address], [place], [place] [address]      Radiological Exams on Admission: DG Chest 2 View  Result Date: 05/30/2021 CLINICAL DATA:  Abnormal breath sounds. EXAM: CHEST - 2 VIEW COMPARISON:  05/27/2021 FINDINGS: Right base collapse/consolidation with small right pleural effusion noted. Similar asymmetric elevation right hemidiaphragm. Left lung clear. No pulmonary edema or evidence of pneumothorax. The cardiopericardial silhouette is within normal limits for size. The visualized bony structures of the thorax show no acute abnormality. IMPRESSION: Right base collapse/consolidation with small right pleural effusion. Electronically Signed   By: Kennith Center M.D.   On: 05/30/2021 10:25      Assessment/Plan Principal Problem:   Sepsis (HCC) Active Problems:   CAP (community acquired pneumonia)   Type 2 diabetes mellitus with hyperglycemia (HCC)   Essential hypertension    Sepsis with community-acquired pneumonia with right-sided pleural effusion for which patient is empirically started on antibiotics follow cultures.  Closely monitor for any worsening of the right-sided pleural effusion for which thoracentesis may be needed.  At presentation patient was tachycardic  with leukocytosis elevated lactic acid without pneumonia on x-ray has sepsis physiology. Abnormal LFTs with recent CAT scan of the abdomen pelvis done 4 days ago showing cholelithiasis.  Abdomen appears benign at that exam.  We will follow LFTs check right upper quadrant ultrasound. Acute renal failure likely from nausea vomiting and poor oral intake.  Hydrate and follow metabolic panel hold ARB for now. Hypertension holding ARB due to renal failure will hydrate follow metabolic panel as needed IV hydralazine for now. Hyperlipidemia due to elevated LFTs holding statins  for now. Diabetes mellitus type 2 uncontrolled with hyperglycemia has not taken his night dose of insulin at this time we will dose that follow CBGs closely check hemoglobin A1c.     DVT prophylaxis: Lovenox. Code Status: Full code. Family Communication: Discussed with patient. Disposition Plan: Home. Consults called: None. Admission status: Observation.   Eduard Clos MD [facility] Pager [phone].  If 7PM-7AM, please contact night-coverage [email or web address] Password [email or web address]  05/31/2021, 3:23 AM

## 2021-06-01 ENCOUNTER — Inpatient Hospital Stay (HOSPITAL_COMMUNITY): Payer: Medicare Other

## 2021-06-01 DIAGNOSIS — J189 Pneumonia, unspecified organism: Secondary | ICD-10-CM | POA: Diagnosis not present

## 2021-06-01 DIAGNOSIS — Z794 Long term (current) use of insulin: Secondary | ICD-10-CM

## 2021-06-01 DIAGNOSIS — E876 Hypokalemia: Secondary | ICD-10-CM | POA: Diagnosis present

## 2021-06-01 DIAGNOSIS — E871 Hypo-osmolality and hyponatremia: Secondary | ICD-10-CM | POA: Diagnosis present

## 2021-06-01 DIAGNOSIS — A419 Sepsis, unspecified organism: Secondary | ICD-10-CM | POA: Diagnosis not present

## 2021-06-01 DIAGNOSIS — E119 Type 2 diabetes mellitus without complications: Secondary | ICD-10-CM

## 2021-06-01 DIAGNOSIS — R609 Edema, unspecified: Secondary | ICD-10-CM

## 2021-06-01 DIAGNOSIS — E785 Hyperlipidemia, unspecified: Secondary | ICD-10-CM | POA: Diagnosis present

## 2021-06-01 DIAGNOSIS — R7401 Elevation of levels of liver transaminase levels: Secondary | ICD-10-CM | POA: Diagnosis present

## 2021-06-01 DIAGNOSIS — R600 Localized edema: Secondary | ICD-10-CM | POA: Diagnosis present

## 2021-06-01 LAB — CBC WITH DIFFERENTIAL/PLATELET
Abs Immature Granulocytes: 0.16 10*3/uL — ABNORMAL HIGH (ref 0.00–0.07)
Basophils Absolute: 0 10*3/uL (ref 0.0–0.1)
Basophils Relative: 0 %
Eosinophils Absolute: 0.1 10*3/uL (ref 0.0–0.5)
Eosinophils Relative: 1 %
HCT: 35.7 % — ABNORMAL LOW (ref 39.0–52.0)
Hemoglobin: 11.8 g/dL — ABNORMAL LOW (ref 13.0–17.0)
Immature Granulocytes: 1 %
Lymphocytes Relative: 7 %
Lymphs Abs: 0.9 10*3/uL (ref 0.7–4.0)
MCH: 30.6 pg (ref 26.0–34.0)
MCHC: 33.1 g/dL (ref 30.0–36.0)
MCV: 92.5 fL (ref 80.0–100.0)
Monocytes Absolute: 1.8 10*3/uL — ABNORMAL HIGH (ref 0.1–1.0)
Monocytes Relative: 15 %
Neutro Abs: 9.1 10*3/uL — ABNORMAL HIGH (ref 1.7–7.7)
Neutrophils Relative %: 76 %
Platelets: 274 10*3/uL (ref 150–400)
RBC: 3.86 MIL/uL — ABNORMAL LOW (ref 4.22–5.81)
RDW: 13.6 % (ref 11.5–15.5)
WBC: 12 10*3/uL — ABNORMAL HIGH (ref 4.0–10.5)
nRBC: 0 % (ref 0.0–0.2)

## 2021-06-01 LAB — COMPREHENSIVE METABOLIC PANEL
ALT: 381 U/L — ABNORMAL HIGH (ref 0–44)
AST: 318 U/L — ABNORMAL HIGH (ref 15–41)
Albumin: 2.7 g/dL — ABNORMAL LOW (ref 3.5–5.0)
Alkaline Phosphatase: 181 U/L — ABNORMAL HIGH (ref 38–126)
Anion gap: 5 (ref 5–15)
BUN: 24 mg/dL — ABNORMAL HIGH (ref 8–23)
CO2: 24 mmol/L (ref 22–32)
Calcium: 8.4 mg/dL — ABNORMAL LOW (ref 8.9–10.3)
Chloride: 104 mmol/L (ref 98–111)
Creatinine, Ser: 0.97 mg/dL (ref 0.61–1.24)
GFR, Estimated: >60 mL/min (ref >60)
Glucose, Bld: 273 mg/dL — ABNORMAL HIGH (ref 70–99)
Potassium: 3.4 mmol/L — ABNORMAL LOW (ref 3.5–5.1)
Sodium: 133 mmol/L — ABNORMAL LOW (ref 135–145)
Total Bilirubin: 1.1 mg/dL (ref 0.3–1.2)
Total Protein: 6.2 g/dL — ABNORMAL LOW (ref 6.5–8.1)

## 2021-06-01 LAB — GLUCOSE, CAPILLARY
Glucose-Capillary: 213 mg/dL — ABNORMAL HIGH (ref 70–99)
Glucose-Capillary: 253 mg/dL — ABNORMAL HIGH (ref 70–99)
Glucose-Capillary: 279 mg/dL — ABNORMAL HIGH (ref 70–99)
Glucose-Capillary: 281 mg/dL — ABNORMAL HIGH (ref 70–99)
Glucose-Capillary: 302 mg/dL — ABNORMAL HIGH (ref 70–99)

## 2021-06-01 LAB — HEMOGLOBIN A1C
Hgb A1c MFr Bld: 8.6 % — ABNORMAL HIGH (ref 4.8–5.6)
Mean Plasma Glucose: 200.12 mg/dL

## 2021-06-01 LAB — MAGNESIUM: Magnesium: 2.3 mg/dL (ref 1.7–2.4)

## 2021-06-01 MED ORDER — MORPHINE SULFATE (PF) 2 MG/ML IV SOLN: 2.0000 mg | INTRAVENOUS | Status: DC | PRN

## 2021-06-01 MED ORDER — FUROSEMIDE 10 MG/ML IJ SOLN
20.0000 mg | Freq: Once | INTRAMUSCULAR | Status: AC
Start: 2021-06-01 — End: 2021-06-01
  Administered 2021-06-01: 20 mg via INTRAVENOUS
  Filled 2021-06-01: qty 2

## 2021-06-01 MED ORDER — POTASSIUM CHLORIDE CRYS ER 20 MEQ PO TBCR
30.0000 meq | EXTENDED_RELEASE_TABLET | Freq: Once | ORAL | Status: AC
  Administered 2021-06-01: 30 meq via ORAL
  Filled 2021-06-01: qty 1

## 2021-06-01 MED ORDER — ACETAMINOPHEN 500 MG PO TABS: 500.0000 mg | ORAL_TABLET | Freq: Four times a day (QID) | ORAL | Status: DC | PRN

## 2021-06-01 MED ORDER — IOHEXOL 350 MG/ML SOLN
60.0000 mL | Freq: Once | INTRAVENOUS | Status: AC | PRN
  Administered 2021-06-01: 80 mL via INTRAVENOUS

## 2021-06-01 MED ORDER — INSULIN GLARGINE-YFGN 100 UNIT/ML ~~LOC~~ SOLN
30.0000 [IU] | Freq: Every day | SUBCUTANEOUS | Status: DC
  Administered 2021-06-01 – 2021-06-02 (×2): 30 [IU] via SUBCUTANEOUS
  Filled 2021-06-01 (×3): qty 0.3

## 2021-06-01 MED ORDER — OXYCODONE HCL 5 MG PO TABS
5.0000 mg | ORAL_TABLET | Freq: Four times a day (QID) | ORAL | Status: DC | PRN
  Administered 2021-06-01 – 2021-06-02 (×2): 5 mg via ORAL
  Filled 2021-06-01 (×2): qty 1

## 2021-06-01 MED ORDER — INSULIN ASPART 100 UNIT/ML IJ SOLN
0.0000 [IU] | INTRAMUSCULAR | Status: DC
  Administered 2021-06-01: 11 [IU] via SUBCUTANEOUS
  Administered 2021-06-01: 8 [IU] via SUBCUTANEOUS
  Administered 2021-06-02 – 2021-06-03 (×2): 2 [IU] via SUBCUTANEOUS
  Administered 2021-06-03 (×2): 5 [IU] via SUBCUTANEOUS

## 2021-06-01 MED ORDER — HYDRALAZINE HCL 25 MG PO TABS
25.0000 mg | ORAL_TABLET | Freq: Three times a day (TID) | ORAL | Status: DC
  Administered 2021-06-01 – 2021-06-05 (×12): 25 mg via ORAL
  Filled 2021-06-01 (×12): qty 1

## 2021-06-01 MED ORDER — OXYCODONE HCL 5 MG PO TABS
5.0000 mg | ORAL_TABLET | Freq: Four times a day (QID) | ORAL | Status: DC | PRN
  Administered 2021-06-01: 5 mg via ORAL
  Filled 2021-06-01: qty 1

## 2021-06-01 MED ORDER — METOPROLOL TARTRATE 25 MG PO TABS
25.0000 mg | ORAL_TABLET | Freq: Two times a day (BID) | ORAL | Status: DC
  Administered 2021-06-01 – 2021-06-05 (×8): 25 mg via ORAL
  Filled 2021-06-01 (×8): qty 1

## 2021-06-01 MED ORDER — AZITHROMYCIN 250 MG PO TABS
500.0000 mg | ORAL_TABLET | Freq: Every day | ORAL | Status: DC
  Administered 2021-06-01 – 2021-06-03 (×3): 500 mg via ORAL
  Filled 2021-06-01 (×3): qty 2

## 2021-06-01 MED ORDER — PIPERACILLIN-TAZOBACTAM 3.375 G IVPB
3.3750 g | Freq: Three times a day (TID) | INTRAVENOUS | Status: DC
  Administered 2021-06-01 – 2021-06-04 (×9): 3.375 g via INTRAVENOUS
  Filled 2021-06-01 (×9): qty 50

## 2021-06-01 NOTE — Assessment & Plan Note (Signed)
Lower extremity Doppler ordered. Suspect this is volume overload.

## 2021-06-01 NOTE — Assessment & Plan Note (Signed)
Not on any statin.

## 2021-06-01 NOTE — Progress Notes (Signed)
Inpatient Diabetes Program Recommendations  AACE/ADA: New Consensus Statement on Inpatient Glycemic Control (2015)  Target Ranges:  Prepandial:   less than 140 mg/dL      Peak postprandial:   less than 180 mg/dL (1-2 hours)      Critically ill patients:  140 - 180 mg/dL   Lab Results  Component Value Date   GLUCAP 213 (H) 06/01/2021    Review of Glycemic Control  Latest Reference Range & Units 05/31/21 07:33 05/31/21 11:10 05/31/21 16:12 05/31/21 19:56 06/01/21 09:35  Glucose-Capillary 70 - 99 mg/dL 719 (H) 597 (H) 471 (H) 234 (H) 213 (H)   Diabetes history: DM 2 Outpatient Diabetes medications: Lantus 47 units QHS, Januvia 100 mg QD, glipizide 10 mg QD, metformin 500 mg BID, Ozempic 0.5 mg weekly  Current orders for Inpatient glycemic control:  Novolog 0-9 units tid Semglee 37 units qhs  Inpatient Diabetes Program Recommendations:    -  Consider increasing Lantus to 42 units  Thanks,  Christena Deem RN, MSN, BC-ADM Inpatient Diabetes Coordinator Team Pager [phone] (8a-5p)

## 2021-06-01 NOTE — Progress Notes (Signed)
PHARMACIST - PHYSICIAN COMMUNICATION  CONCERNING: Antibiotic IV to Oral Route Change Policy  RECOMMENDATION: This patient is receiving azithromycin by the intravenous route.  Based on criteria approved by the Pharmacy and Therapeutics Committee, the antibiotic(s) is/are being converted to the equivalent oral dose form(s).   DESCRIPTION: These criteria include:  Patient being treated for a respiratory tract infection, urinary tract infection, cellulitis or clostridium difficile associated diarrhea if on metronidazole  The patient is not neutropenic and does not exhibit a GI malabsorption state  The patient is eating (either orally or via tube) and/or has been taking other orally administered medications for a least 24 hours  The patient is improving clinically and has a Tmax < 100.5  If you have questions about this conversion, please contact the Pharmacy Department  []  ( [phone] )  [place] []  ( [phone] )  [facility] []  ( [phone]  [place] []  ( [phone] )  [facility] [x]  ( [phone] )  [facility]  

## 2021-06-01 NOTE — Assessment & Plan Note (Signed)
LFTs trending upward from normal on 11/26 to in 300s and 12/1. GI consulted appreciate assistance. Ultrasound abdomen negative for any acute abnormality.  CT chest also negative for any acute abnormality. GI recommend conservative measures for now avoiding hepatotoxic medication.

## 2021-06-01 NOTE — Progress Notes (Addendum)
[facility] Progress Note  Patient: Andrew Duncan    TZG:[id number]  DOA: 05/30/2021    Date of Service: the patient was seen and examined on 06/01/2021  Brief hospital course: 11/29 admitted with shortness of breath found to have sepsis due to pneumonia. 12/1 cultures so far negative.  LFTs worsening.  GI consulted and signed off.  CT PE protocol ordered.  Assessment and Plan: * Sepsis due to pneumonia ([place]) Met SIRS criteria on admission with tachycardia tachypnea and fever. Also leukocytosis.  X-ray showed mild pneumonia on the right lower lung zone.  CT shows dense consolidation on the right.  Also small pleural effusion. Blood cultures so far negative.  Continue with IV antibiotics.  On ceftriaxone dose increased from 1 g to 2 g. Continue to monitor on telemetry. Blood cultures so far negative.  Essential hypertension Blood pressure elevated also has tachycardia. Will add Lopressor.  Transaminitis LFTs trending upward from normal on 11/26 to in 300s and 12/1. GI consulted appreciate assistance. Ultrasound abdomen negative for any acute abnormality.  CT chest also negative for any acute abnormality. GI recommend conservative measures for now avoiding hepatotoxic medication.  Hyponatremia From poor p.o. intake.  Monitor.  Hypokalemia Potassium level mildly low.  Will replace.  Pedal edema Lower extremity Doppler ordered. Suspect this is volume overload.  Type 2 diabetes mellitus without complication, with long-term current use of insulin (HCC) No hemoglobin A1c in our system. Uncontrolled with hyperglycemia Continue sliding scale insulin.  Hyperlipidemia Not on any statin.    Body mass index is 29.67 kg/m.        Subjective: Continues to have shortness of breath.  Overnight remains tachycardic.  Also continues to have abdominal pain in the right upper quadrant area.  No nausea no vomiting.  Passing gas.  Objective: Tachycardic, hypertensive,  tachypneic.  Exam: General: Appear in mild distress, no Rash; Oral Mucosa Clear, moist. no Abnormal Neck Mass Or lumps, Conjunctiva normal  Cardiovascular: S1 and S2 Present, no Murmur, Respiratory: increased respiratory effort, Bilateral Air entry present and bilateral  Crackles, no wheezes Abdomen: Bowel Sound present, Soft and no tenderness Extremities: trace Pedal edema Neurology: alert and oriented to time, place, and person affect appropriate. no new focal deficit Gait not checked due to patient safety concerns     Data Reviewed: Hypoglycemia, hypokalemia, worsening LFT.  Disposition:  Status is: Inpatient  Remains inpatient appropriate because: Further work-up of worsening LFT, continue antibiotics, improvement in respiratory distress  Family Communication: Wife at bedside.  Questions answered.  DVT Prophylaxis: enoxaparin (LOVENOX) injection 40 mg Start: 05/31/21 1000   Time spent: 35 minutes.   Author: Berle Mull  06/01/2021 5:17 PM  To reach On-call, see care teams to locate the attending and reach out via [email or web address]. Between 7PM-7AM, please contact night-coverage If you still have difficulty reaching the attending provider, please page the [facility] (Director on Call) for [facility] on amion for assistance.

## 2021-06-01 NOTE — Progress Notes (Signed)
Order received for continuous BiPAP. Wife at bedside states patient is NPO after midnight and would like him to eat prior to BiPAP placement. Patient agrees that he would like to eat and states his breathing is "a little better". VSS at this time. Plan to allow meal and follow up in about an hour for BiPAP initiation.  RN made aware of plan.

## 2021-06-01 NOTE — Progress Notes (Signed)
Pharmacy Antibiotic Note  Andrew Duncan is a 75 y.o. male admitted on 05/30/2021 with c/o SOB an cough.  He was started on ceftriaxone and azithromycin on admission for PNA. Chest CT on 06/01/21 showed findings with concern for acute cholecystitis.  Pharmacy has been consulted to start zosyn for intra-abdominal infection and PNA.  - scr down 0.97  Plan: - zosyn 3.375 gm IV q8h (infuse over 4 hrs) - pharmacy will sign off for zosyn. Re-consult [place] if need further assistance _____________________________________________  Height: 5\' 5"  (165.1 cm) Weight: 80.9 kg (178 lb 4.8 oz) IBW/kg (Calculated) : 61.5  Temp (24hrs), Avg:98.2 F (36.8 C), Min:97.8 F (36.6 C), Max:98.6 F (37 C)  Recent Labs  Lab 05/27/21 1231 05/30/21 1804 05/30/21 2246 05/31/21 0415 05/31/21 0649 06/01/21 0349  WBC 11.6* 19.5*  --  15.8*  --  12.0*  CREATININE 0.85 1.60*  --  1.57*  --  0.97  LATICACIDVEN  --   --  2.4* 1.6 1.8  --     Estimated Creatinine Clearance: 65.5 mL/min (by C-G formula based on SCr of 0.97 mg/dL).    Allergies  Allergen Reactions   Versed [Midazolam]      Thank you for allowing pharmacy to be a part of this patient's care.  14/01/22 06/01/2021 5:50 PM

## 2021-06-01 NOTE — Assessment & Plan Note (Addendum)
LFTs trending upward from normal on 11/26 to in 300s and 12/1. Now trending down after drain placement. GI consulted appreciate assistance.  Currently signed off. Ultrasound abdomen negative for any acute abnormality.  Shows small volume ascites. avoiding hepatotoxic medication.

## 2021-06-01 NOTE — Plan of Care (Signed)
Pt satisfied with pain regimen. CT and LE [place] completed.   Problem: Education: Goal: Knowledge of General Education information will improve Description: Including pain rating scale, medication(s)/side effects and non-pharmacologic comfort measures Outcome: Progressing   Problem: Health Behavior/Discharge Planning: Goal: Ability to manage health-related needs will improve Outcome: Progressing   Problem: Clinical Measurements: Goal: Ability to maintain clinical measurements within normal limits will improve Outcome: Progressing Goal: Will remain free from infection Outcome: Progressing Goal: Diagnostic test results will improve Outcome: Progressing Goal: Respiratory complications will improve Outcome: Progressing Goal: Cardiovascular complication will be avoided Outcome: Progressing   Problem: Activity: Goal: Risk for activity intolerance will decrease Outcome: Progressing   Problem: Nutrition: Goal: Adequate nutrition will be maintained Outcome: Progressing   Problem: Coping: Goal: Level of anxiety will decrease Outcome: Progressing   Problem: Pain Managment: Goal: General experience of comfort will improve Outcome: Progressing   Problem: Safety: Goal: Ability to remain free from injury will improve Outcome: Progressing   Problem: Skin Integrity: Goal: Risk for impaired skin integrity will decrease Outcome: Progressing

## 2021-06-01 NOTE — Assessment & Plan Note (Signed)
Met SIRS criteria on admission with tachycardia tachypnea and fever. Also leukocytosis.  X-ray showed mild pneumonia on the right lower lung zone.  CT shows dense consolidation on the right.  Also small pleural effusion. Blood cultures so far negative.  Continue with IV antibiotics.  On ceftriaxone dose increased from 1 g to 2 g. Continue to monitor on telemetry. Blood cultures so far negative.

## 2021-06-01 NOTE — Consult Note (Signed)
Reason for Consult:abd pain Referring Physician: Dr. [name] is an 75 y.o. male.  HPI: The patient is a 75 year old black male who is admitted to the hospital with increasing abdominal pain and shortness of breath.  He began having abdominal pain about a week ago.  He went to the emergency department once or twice where CT scans and ultrasounds showed only some possible small stones in the gallbladder but no gallbladder wall thickening or ductal dilatation.  During that time he is also developed some significant shortness of breath.  His current scans show that he has a significant pneumonia on the right side as well as some thickening of the gallbladder wall suggest possible cholecystitis.  He has been nauseated and vomiting during the last week.  Past Medical History:  Diagnosis Date   Anemia    Childhood asthma    Diabetes (HCC)    Essential hypertension    Hypercholesterolemia    Hyperlipidemia    Polyneuropathy    Retinopathy     Past Surgical History:  Procedure Laterality Date   ANAL FISSURE REPAIR     left elbow fracture pinning     pinning    Family History  Problem Relation Age of Onset   Diabetes Mother    Stroke Mother     Social History:  reports that he has never smoked. He has never used smokeless tobacco. He reports that he does not currently use alcohol. He reports that he does not use drugs.  Allergies:  Allergies  Allergen Reactions   Versed [Midazolam]     Medications: I have reviewed the patient's current medications.  Results for orders placed or performed during the hospital encounter of 05/30/21 (from the past 48 hour(s))  Culture, blood (Routine X 2) w Reflex to ID Panel     Status: None (Preliminary result)   Collection Time: 05/30/21 10:46 PM   Specimen: BLOOD  Result Value Ref Range   Specimen Description      BLOOD LEFT ANTECUBITAL Performed at [facility], [address], [place], [place] [address]     Special Requests      BOTTLES DRAWN AEROBIC AND ANAEROBIC Blood Culture adequate volume Performed at [facility], [address] [place], [place] [address]    Culture      NO GROWTH 1 DAY Performed at [facility], [address] [place], [place] [address]    Report Status PENDING   Lactic acid, plasma     Status: Abnormal   Collection Time: 05/30/21 10:46 PM  Result Value Ref Range   Lactic Acid, Venous 2.4 (HH) 0.5 - 1.9 mmol/L    Comment: CRITICAL RESULT CALLED TO, READ BACK BY AND VERIFIED WITH:  REBECCA GOODWIN RN 05/30/21 @ 2337 VS Performed at [facility], [address] [place], [place] [address]   Culture, blood (Routine X 2) w Reflex to ID Panel     Status: None (Preliminary result)   Collection Time: 05/30/21 10:49 PM   Specimen: BLOOD  Result Value Ref Range   Specimen Description      BLOOD RIGHT ANTECUBITAL Performed at [facility], [address], [place], [place] [address]    Special Requests      BOTTLES DRAWN AEROBIC AND ANAEROBIC Blood Culture adequate volume Performed at [facility], [address], [place], [place] [address]    Culture      NO GROWTH 1 DAY  Performed at [facility], [address], [place], [place] [address]    Report Status PENDING   CBC with Differential     Status: Abnormal   Collection Time: 05/31/21  4:15 AM  Result Value Ref Range   WBC 15.8 (H) 4.0 - 10.5 K/uL   RBC 3.80 (L) 4.22 - 5.81 MIL/uL   Hemoglobin 11.6 (L) 13.0 - 17.0 g/dL   HCT 82.9 (L) 56.2 - 13.0 %   MCV 91.3 80.0 - 100.0 fL   MCH 30.5 26.0 - 34.0 pg   MCHC 33.4 30.0 - 36.0 g/dL   RDW 86.5 78.4 - 69.6 %   Platelets 232 150 - 400 K/uL   nRBC 0.0 0.0 - 0.2 %   Neutrophils Relative % 78 %   Neutro Abs 12.3 (H) 1.7 - 7.7 K/uL   Lymphocytes Relative 7 %   Lymphs Abs 1.1 0.7 - 4.0 K/uL   Monocytes Relative 14 %   Monocytes Absolute 2.2 (H) 0.1 - 1.0 K/uL   Eosinophils  Relative 0 %   Eosinophils Absolute 0.1 0.0 - 0.5 K/uL   Basophils Relative 0 %   Basophils Absolute 0.0 0.0 - 0.1 K/uL   Immature Granulocytes 1 %   Abs Immature Granulocytes 0.16 (H) 0.00 - 0.07 K/uL    Comment: Performed at [facility], [address], [place], [place] [address]  Comprehensive metabolic panel     Status: Abnormal   Collection Time: 05/31/21  4:15 AM  Result Value Ref Range   Sodium 132 (L) 135 - 145 mmol/L   Potassium 3.6 3.5 - 5.1 mmol/L   Chloride 101 98 - 111 mmol/L   CO2 24 22 - 32 mmol/L   Glucose, Bld 266 (H) 70 - 99 mg/dL    Comment: Glucose reference range applies only to samples taken after fasting for at least 8 hours.   BUN 40 (H) 8 - 23 mg/dL   Creatinine, Ser 4.13 (H) 0.61 - 1.24 mg/dL   Calcium 8.5 (L) 8.9 - 10.3 mg/dL   Total Protein 6.3 (L) 6.5 - 8.1 g/dL   Albumin 2.7 (L) 3.5 - 5.0 g/dL   AST 71 (H) 15 - 41 U/L   ALT 138 (H) 0 - 44 U/L   Alkaline Phosphatase 103 38 - 126 U/L   Total Bilirubin 0.6 0.3 - 1.2 mg/dL   GFR, Estimated 46 (L) >60 mL/min    Comment: (NOTE) Calculated using the CKD-EPI Creatinine Equation (2021)    Anion gap 7 5 - 15    Comment: Performed at [facility], [address], [place], [place] [address]  Lactic acid, plasma     Status: None   Collection Time: 05/31/21  4:15 AM  Result Value Ref Range   Lactic Acid, Venous 1.6 0.5 - 1.9 mmol/L    Comment: Performed at [facility], [address] [place], [place] [address]  Lactic acid, plasma     Status: None   Collection Time: 05/31/21  6:49 AM  Result Value Ref Range   Lactic Acid, Venous 1.8 0.5 - 1.9 mmol/L    Comment: Performed at [facility], [address] [place], [place] [address]  Glucose, capillary     Status: Abnormal   Collection Time: 05/31/21  7:33 AM  Result Value Ref Range   Glucose-Capillary 168 (H) 70 - 99 mg/dL    Comment: Glucose reference range applies only to  samples taken after fasting  for at least 8 hours.  Glucose, capillary     Status: Abnormal   Collection Time: 05/31/21 11:10 AM  Result Value Ref Range   Glucose-Capillary 188 (H) 70 - 99 mg/dL    Comment: Glucose reference range applies only to samples taken after fasting for at least 8 hours.  Glucose, capillary     Status: Abnormal   Collection Time: 05/31/21  4:12 PM  Result Value Ref Range   Glucose-Capillary 141 (H) 70 - 99 mg/dL    Comment: Glucose reference range applies only to samples taken after fasting for at least 8 hours.  Glucose, capillary     Status: Abnormal   Collection Time: 05/31/21  7:56 PM  Result Value Ref Range   Glucose-Capillary 234 (H) 70 - 99 mg/dL    Comment: Glucose reference range applies only to samples taken after fasting for at least 8 hours.  CBC with Differential/Platelet     Status: Abnormal   Collection Time: 06/01/21  3:49 AM  Result Value Ref Range   WBC 12.0 (H) 4.0 - 10.5 K/uL   RBC 3.86 (L) 4.22 - 5.81 MIL/uL   Hemoglobin 11.8 (L) 13.0 - 17.0 g/dL   HCT 16.1 (L) 09.6 - 04.5 %   MCV 92.5 80.0 - 100.0 fL   MCH 30.6 26.0 - 34.0 pg   MCHC 33.1 30.0 - 36.0 g/dL   RDW 40.9 81.1 - 91.4 %   Platelets 274 150 - 400 K/uL   nRBC 0.0 0.0 - 0.2 %   Neutrophils Relative % 76 %   Neutro Abs 9.1 (H) 1.7 - 7.7 K/uL   Lymphocytes Relative 7 %   Lymphs Abs 0.9 0.7 - 4.0 K/uL   Monocytes Relative 15 %   Monocytes Absolute 1.8 (H) 0.1 - 1.0 K/uL   Eosinophils Relative 1 %   Eosinophils Absolute 0.1 0.0 - 0.5 K/uL   Basophils Relative 0 %   Basophils Absolute 0.0 0.0 - 0.1 K/uL   Immature Granulocytes 1 %   Abs Immature Granulocytes 0.16 (H) 0.00 - 0.07 K/uL   Reactive, Benign Lymphocytes PRESENT     Comment: Performed at [facility], [address], [place], [place] [address]  Comprehensive metabolic panel     Status: Abnormal   Collection Time: 06/01/21  3:49 AM  Result Value Ref Range   Sodium 133 (L) 135 - 145 mmol/L    Potassium 3.4 (L) 3.5 - 5.1 mmol/L   Chloride 104 98 - 111 mmol/L   CO2 24 22 - 32 mmol/L   Glucose, Bld 273 (H) 70 - 99 mg/dL    Comment: Glucose reference range applies only to samples taken after fasting for at least 8 hours.   BUN 24 (H) 8 - 23 mg/dL   Creatinine, Ser 6.21 0.61 - 1.24 mg/dL   Calcium 8.4 (L) 8.9 - 10.3 mg/dL   Total Protein 6.2 (L) 6.5 - 8.1 g/dL   Albumin 2.7 (L) 3.5 - 5.0 g/dL   AST 308 (H) 15 - 41 U/L   ALT 381 (H) 0 - 44 U/L   Alkaline Phosphatase 181 (H) 38 - 126 U/L   Total Bilirubin 1.1 0.3 - 1.2 mg/dL   GFR, Estimated >65 >78 mL/min    Comment: (NOTE) Calculated using the CKD-EPI Creatinine Equation (2021)    Anion gap 5 5 - 15    Comment: Performed at [facility], [address], [place], [place] [address]  Magnesium  Status: None   Collection Time: 06/01/21  3:49 AM  Result Value Ref Range   Magnesium 2.3 1.7 - 2.4 mg/dL    Comment: Performed at [facility], [address], [place], [place] [address]  Glucose, capillary     Status: Abnormal   Collection Time: 06/01/21  9:35 AM  Result Value Ref Range   Glucose-Capillary 213 (H) 70 - 99 mg/dL    Comment: Glucose reference range applies only to samples taken after fasting for at least 8 hours.  Glucose, capillary     Status: Abnormal   Collection Time: 06/01/21  2:57 PM  Result Value Ref Range   Glucose-Capillary 279 (H) 70 - 99 mg/dL    Comment: Glucose reference range applies only to samples taken after fasting for at least 8 hours.  Glucose, capillary     Status: Abnormal   Collection Time: 06/01/21  6:55 PM  Result Value Ref Range   Glucose-Capillary 302 (H) 70 - 99 mg/dL    Comment: Glucose reference range applies only to samples taken after fasting for at least 8 hours.    CT Angio Chest Pulmonary Embolism (PE) W or WO Contrast  Addendum Date: 06/01/2021   ADDENDUM REPORT: 06/01/2021 17:47 ADDENDUM: These results were called by telephone at the  time of interpretation on 06/01/2021 at 5:47 pm to provider [facility] PATEL , who verbally acknowledged these results. Electronically Signed   By: Richarda Overlie M.D.   On: 06/01/2021 17:47   Result Date: 06/01/2021 CLINICAL DATA:  Pulmonary embolism suspected, high probability. Elevated D-dimer. EXAM: CT ANGIOGRAPHY CHEST WITH CONTRAST TECHNIQUE: Multidetector CT imaging of the chest was performed using the standard protocol during bolus administration of intravenous contrast. Multiplanar CT image reconstructions and MIPs were obtained to evaluate the vascular anatomy. CONTRAST:  76mL OMNIPAQUE IOHEXOL 350 MG/ML SOLN COMPARISON:  CT abdomen pelvis 05/27/2021 and ultrasound 05/31/2021 FINDINGS: Cardiovascular: Suboptimal evaluation for pulmonary emboli due to the timing of the exam. Large amount of the contrast is already in the thoracic aorta. However, the main pulmonary arteries are patent. Cannot evaluate the lobar and more distal pulmonary artery branches. Normal caliber of the thoracic aorta. Heart size is normal without significant pericardial effusion. Mediastinum/Nodes: No significant lymph node enlargement in the mediastinum. No axillary lymph node enlargement. Lungs/Pleura: Compared to 05/27/2021, there is marked volume loss and consolidation in the right lower chest with a small amount of loculated right pleural fluid. Minimal aeration in the right lower lobe or right middle lobe. Significant motion artifact in the lungs. No significant airspace disease or consolidation in left lung. No significant left pleural fluid. Upper Abdomen: Gallbladder appears to be distended with wall irregularity. This represents a change from the previous CT. Small amount of high-density material in the gallbladder could represent sludge or small stones. In addition, there is small amount of perihepatic ascites which may be within the subcapsular space. This perihepatic fluid is new from the previous CT but there was fluid in this  area on the recent ultrasound. The entire abdomen is not imaged. No significant gastric distension. Musculoskeletal: No acute bone abnormality. Review of the MIP images confirms the above findings. IMPRESSION: 1. Volume loss and consolidation in right lower lobe and right middle lobe. Small loculated right pleural effusion. Findings could be secondary to pneumonia. 2. Limited evaluation for pulmonary embolism due to motion artifact and poor opacification of the pulmonary arteries. However, the main pulmonary arteries are patent and no evidence for a large central  pulmonary embolism. 3. Abnormal appearance of the gallbladder with irregular fluid collections along the inferior aspect of the liver. Significant change in the gallbladder since 05/27/2021 and findings are concerning for acute cholecystitis. Abdominal findings could be better characterized with a dedicated CT of the abdomen and pelvis with IV contrast. Electronically Signed: By: Richarda Overlie M.D. On: 06/01/2021 17:37   VAS [place] LOWER EXTREMITY VENOUS (DVT)  Result Date: 06/01/2021  Lower Venous DVT Study Patient Name:  Andrew Duncan  Date of Exam:   06/01/2021 Medical Rec #: [id number]       Accession #:    [id number] Date of Birth: [birth year]       Patient Gender: M Patient Age:   38 years Exam Location:  [facility] Procedure:      VAS [place] LOWER EXTREMITY VENOUS (DVT) Referring Phys: PRANAV PATEL --------------------------------------------------------------------------------  Indications: Edema.  Risk Factors: None identified. Comparison Study: No prior studies. Performing Technologist: Chanda Busing RVT  Examination Guidelines: A complete evaluation includes B-mode imaging, spectral Doppler, color Doppler, and power Doppler as needed of all accessible portions of each vessel. Bilateral testing is considered an integral part of a complete examination. Limited examinations for reoccurring indications may be performed as noted. The reflux portion  of the exam is performed with the patient in reverse Trendelenburg.  +---------+---------------+---------+-----------+----------+--------------+ RIGHT    CompressibilityPhasicitySpontaneityPropertiesThrombus Aging +---------+---------------+---------+-----------+----------+--------------+ CFV      Full           Yes      Yes                                 +---------+---------------+---------+-----------+----------+--------------+ SFJ      Full                                                        +---------+---------------+---------+-----------+----------+--------------+ FV Prox  Full                                                        +---------+---------------+---------+-----------+----------+--------------+ FV Mid   Full                                                        +---------+---------------+---------+-----------+----------+--------------+ FV DistalFull                                                        +---------+---------------+---------+-----------+----------+--------------+ PFV      Full                                                        +---------+---------------+---------+-----------+----------+--------------+ POP  Full           Yes      Yes                                 +---------+---------------+---------+-----------+----------+--------------+ PTV      Full                                                        +---------+---------------+---------+-----------+----------+--------------+ PERO     Full                                                        +---------+---------------+---------+-----------+----------+--------------+   +---------+---------------+---------+-----------+----------+--------------+ LEFT     CompressibilityPhasicitySpontaneityPropertiesThrombus Aging +---------+---------------+---------+-----------+----------+--------------+ CFV      Full           Yes      Yes                                  +---------+---------------+---------+-----------+----------+--------------+ SFJ      Full                                                        +---------+---------------+---------+-----------+----------+--------------+ FV Prox  Full                                                        +---------+---------------+---------+-----------+----------+--------------+ FV Mid   Full                                                        +---------+---------------+---------+-----------+----------+--------------+ FV DistalFull                                                        +---------+---------------+---------+-----------+----------+--------------+ PFV      Full                                                        +---------+---------------+---------+-----------+----------+--------------+ POP      Full           Yes      Yes                                 +---------+---------------+---------+-----------+----------+--------------+  PTV      Full                                                        +---------+---------------+---------+-----------+----------+--------------+ PERO     Full                                                        +---------+---------------+---------+-----------+----------+--------------+     Summary: RIGHT: - There is no evidence of deep vein thrombosis in the lower extremity.  - No cystic structure found in the popliteal fossa.  LEFT: - There is no evidence of deep vein thrombosis in the lower extremity.  - No cystic structure found in the popliteal fossa.  *See table(s) above for measurements and observations. Electronically signed by Sherald Hess MD on 06/01/2021 at 5:30:44 PM.    Final    US Abdomen Limited RUQ (LIVER/GB)  Result Date: 05/31/2021 CLINICAL DATA:  Elevated LFTs. EXAM: ULTRASOUND ABDOMEN LIMITED RIGHT UPPER QUADRANT COMPARISON:  CT scan 05/27/2021 FINDINGS: Gallbladder: No gallstones  or wall thickening visualized. No sonographic Murphy sign noted by sonographer. Dependent sludge evident. Common bile duct: Diameter: 3 mm Liver: Coarsening of hepatic echotexture suggests fatty deposition. No focal abnormality evident. Portal vein is patent on color Doppler imaging with normal direction of blood flow towards the liver. Other: Small volume ascites IMPRESSION: Small volume ascites. Probable fatty deposition in the liver. Electronically Signed   By: Kennith Center M.D.   On: 05/31/2021 06:37    Review of Systems  Constitutional:  Positive for fever.  HENT: Negative.    Eyes: Negative.   Respiratory:  Positive for shortness of breath.   Cardiovascular: Negative.   Gastrointestinal:  Positive for abdominal pain, nausea and vomiting.  Endocrine: Negative.   Genitourinary: Negative.   Musculoskeletal: Negative.   Skin: Negative.   Allergic/Immunologic: Negative.   Neurological: Negative.   Hematological: Negative.   Psychiatric/Behavioral: Negative.    Blood pressure (!) 155/71, pulse (!) 112, temperature 97.8 F (36.6 C), temperature source Oral, resp. rate (!) 40, height 5\' 5"  (1.651 m), weight 80.9 kg, SpO2 97 %. Physical Exam Constitutional:      Appearance: Normal appearance. He is ill-appearing.  HENT:     Head: Normocephalic and atraumatic.     Right Ear: External ear normal.     Left Ear: External ear normal.     Nose: Nose normal.     Mouth/Throat:     Mouth: Mucous membranes are dry.     Pharynx: Oropharynx is clear.  Eyes:     General: No scleral icterus.    Extraocular Movements: Extraocular movements intact.     Conjunctiva/sclera: Conjunctivae normal.     Pupils: Pupils are equal, round, and reactive to light.  Cardiovascular:     Rate and Rhythm: Normal rate and regular rhythm.     Pulses: Normal pulses.     Heart sounds: Normal heart sounds.  Pulmonary:     Effort: Respiratory distress present.     Breath sounds: Rales present.  Abdominal:      General: Abdomen is flat.     Tenderness: There is abdominal tenderness.  Comments: Moderate tenderness RUQ  Musculoskeletal:        General: Normal range of motion.     Cervical back: Normal range of motion and neck supple. No tenderness.  Skin:    General: Skin is warm and dry.     Coloration: Skin is not jaundiced.  Neurological:     General: No focal deficit present.     Mental Status: He is alert and oriented to person, place, and time.  Psychiatric:        Mood and Affect: Mood normal.        Behavior: Behavior normal.    Assessment/Plan: The patient appears to have a significant right-sided pneumonia as well as possible cholecystitis.  At this point both processes would need to be treated with broad-spectrum antibiotic therapy.  If the right upper quadrant pain does not improve then he may also be a candidate for percutaneous drainage of the gallbladder.  His lung function would need to significantly improve before he be a good candidate for surgery so given the presence of the pneumonia he would likely benefit from percutaneous drain with plans for an interval appendectomy once the pneumonia resolves in a couple months.  We will discuss this with the primary team and with interventional radiology tomorrow.  We will follow him closely with you.  Andrew Duncan 06/01/2021, 8:11 PM

## 2021-06-01 NOTE — Progress Notes (Signed)
Spoke with RN about PT shortness of breath- RN is contacting MD.

## 2021-06-01 NOTE — Progress Notes (Signed)
PT demonstrated hands on understanding of Flutter device. 

## 2021-06-01 NOTE — Assessment & Plan Note (Signed)
No hemoglobin A1c in our system. Uncontrolled with hyperglycemia Continue sliding scale insulin.

## 2021-06-01 NOTE — Progress Notes (Signed)
[facility] PROGRESS NOTE  Patient: Andrew Duncan JOA:[id number]   PCP: Renford Dills, MD DOB: [birth year]   DOA: 05/30/2021   DOS: 06/01/2021    Subjective: Pain improving no vomiting.  Objective:  Vitals:   06/01/21 1748 06/01/21 1845  BP: (!) 159/76 (!) 155/71  Pulse:    Resp:  (!) 40  Temp:    SpO2:  97%  Bilateral air entry present.  Less on the right. Bowel sounds present. No significant tenderness right now. Lower extremity edema still present.  Assessment and plan: Respiratory distress. Use BiPAP as needed. Will get IV Lasix 20 mg x 1 as well.  Abnormal CT chest. CT chest shows that there is a potential thickening of gallbladder with concerning findings for cholecystitis as well as potential perforation. Consulted general surgery although patient not a good candidate for any intervention given his pneumonia with respiratory distress. For now continue with IV antibiotics changed from IV ceftriaxone to IV Zosyn. Pain control. N.p.o. after midnight. Pending general surgery evaluation may require IR guided PERC drain placement.  Transferred to progressive care unit. If unable to perform BiPAP on the progressive care, transfer to stepdown unit.  Author: Lynden Oxford, MD [facility] 06/01/2021 7:03 PM   If 7PM-7AM, please contact night-coverage at [email or web address]

## 2021-06-01 NOTE — Consult Note (Signed)
Referring Provider: [facility] Primary Care Physician:  Seward Carol, MD Primary Gastroenterologist:  unassigned  Reason for Consultation:  Elevated LFTs  HPI: Andrew Duncan is a 75 y.o. male  with history of diabetes mellitus type 2, hypertension, hyperlipidemia presents for elevated LFTs.  Patient originally presented to ED for nausea and vomiting. Unconcerning CT and was sent home. He returned feeling weak, short of breath and had a productive cough with right sided chest pleuritic chest pain. CXR showed right sided pneumonia with pleural effusion and AKI. Patient was put on IV antibiotics.  GI was consulted for elevated LFTs. AST 318/ ALT 381/ Alk phos 181/ 1.1 (trending upward from admission).  Review of Ec[facility], primary labs 04/21/21 showed AST 16/ ALT 21/ ALP 53. Appears upward trend began at PCP 11/29 with AST 68/ ALT 175/ ALP 112/ T. Bili 1.1.  States he has RUQ pain that is worse when he coughs. Denies nausea and vomiting. Has had some dark stools lately, but has been using pepto recently. No previous history of elevated LFTs. Denies family history of colon cancer.  Last colonoscopy in 2014 by Dr. Wynetta Emery, normal, repeat 10 years.   Past Medical History:  Diagnosis Date   Anemia    Childhood asthma    Diabetes ([place])    Essential hypertension    Hypercholesterolemia    Hyperlipidemia    Polyneuropathy    Retinopathy     Past Surgical History:  Procedure Laterality Date   ANAL FISSURE REPAIR     left elbow fracture pinning     pinning    Prior to Admission medications   Medication Sig Start Date End Date Taking? Authorizing Provider  aspirin EC 81 MG tablet Take 81 mg by mouth daily.   Yes [provider]  cholecalciferol (VITAMIN D) 25 MCG (1000 UNIT) tablet Take 1,000 Units by mouth daily.   Yes [provider]  glipiZIDE (GLUCOTROL XL) 10 MG 24 hr tablet Take 10 mg by mouth daily. 02/28/18  Yes [provider]  JANUVIA 100 MG tablet Take 100 mg  by mouth daily. 02/24/18  Yes [provider]  LANTUS SOLOSTAR 100 UNIT/ML Solostar Pen 47 Units at bedtime. 02/14/18  Yes [provider]  latanoprost (XALATAN) 0.005 % ophthalmic solution Place 1 drop into both eyes at bedtime. 01/31/18  Yes [provider]  losartan (COZAAR) 25 MG tablet Take 25 mg by mouth daily. 03/21/18  Yes [provider]  metFORMIN (GLUCOPHAGE) 500 MG tablet Take 500 mg by mouth 2 (two) times daily with a meal. 03/17/18  Yes [provider]  Multiple Vitamin (MULTIVITAMIN WITH MINERALS) TABS tablet Take 1 tablet by mouth daily.   Yes [provider]  pravastatin (PRAVACHOL) 20 MG tablet Take 20 mg by mouth daily. 02/03/18  Yes [provider]  Semaglutide (OZEMPIC, 2 MG/DOSE, Christie) Inject 0.5 mg into the skin once a week. Every Friday   Yes [provider]  timolol (TIMOPTIC) 0.5 % ophthalmic solution Place 1 drop into both eyes 2 (two) times daily. 01/07/18  Yes [provider]  Albuterol Sulfate (PROAIR HFA IN) Inhale into the lungs. Patient not taking: Reported on 05/30/2021    [provider]  BD PEN NEEDLE NANO U/F 32G X 4 MM MISC USE NEEDLES FOR INJECTION TWICE A DAY 01/30/18   [provider]  DIPHENHYDRAMINE HCL PO Take 10 mg by mouth. Patient not taking: Reported on 05/30/2021    [provider]  ondansetron [facility]) 4  MG tablet Take 1 tablet (4 mg total) by mouth every 6 (six) hours. Patient not taking: Reported on 05/30/2021 05/27/21   Couture, Cortni S, PA-C    Scheduled Meds:  aspirin EC  81 mg Oral Daily   azithromycin  500 mg Oral QHS   enoxaparin (LOVENOX) injection  40 mg Subcutaneous Q24H   insulin aspart  0-9 Units Subcutaneous TID WC   insulin glargine-yfgn  37 Units Subcutaneous QHS   latanoprost  1 drop Both Eyes QHS   potassium chloride  30 mEq Oral Once   timolol  1 drop Both Eyes BID   Continuous Infusions:  cefTRIAXone (ROCEPHIN)  IV 2 g  (05/31/21 2108)   PRN Meds:.acetaminophen, hydrALAZINE, ipratropium-albuterol, oxyCODONE  Allergies as of 05/30/2021 - Review Complete 05/27/2021  Allergen Reaction Noted   Versed [midazolam]  05/30/2021    Family History  Problem Relation Age of Onset   Diabetes Mother    Stroke Mother     Social History   Socioeconomic History   Marital status: Married    Spouse name: Not on file   Number of children: 2   Years of education: Not on file   Highest education level: Doctorate  Occupational History   Occupation: Art therapist at [employer]  Tobacco Use   Smoking status: Never   Smokeless tobacco: Never  Vaping Use   Vaping Use: Never used  Substance and Sexual Activity   Alcohol use: Not Currently   Drug use: Never   Sexual activity: Not on file  Other Topics Concern   Not on file  Social History Narrative   Lives with wife in a 2 story home.  Has 2 children.  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  Education: PhD.   Social Determinants of Health   Financial Resource Strain: Not on file  Food Insecurity: Not on file  Transportation Needs: Not on file  Physical Activity: Not on file  Stress: Not on file  Social Connections: Not on file  Intimate Partner Violence: Not on file    Review of Systems: Review of Systems  Constitutional:  Negative for chills and fever.  HENT:  Negative for ear pain and hearing loss.   Eyes:  Negative for blurred vision and double vision.  Respiratory:  Positive for cough and shortness of breath.   Cardiovascular:  Positive for chest pain (pleuritic). Negative for palpitations.  Gastrointestinal:  Positive for abdominal pain. Negative for blood in stool, constipation, diarrhea, heartburn, melena, nausea and vomiting.  Genitourinary:  Negative for dysuria and urgency.  Musculoskeletal:  Negative for myalgias and neck pain.  Skin:  Negative for itching and rash.  Neurological:  Negative for dizziness and headaches.  Psychiatric/Behavioral:  Negative for  suicidal ideas. The patient is not nervous/anxious.     Physical Exam:Physical Exam Constitutional:      Appearance: He is ill-appearing.     Comments: Wife at bedside  HENT:     Head: Normocephalic and atraumatic.     Nose: Nose normal. No congestion.     Mouth/Throat:     Mouth: Mucous membranes are moist.     Pharynx: Oropharynx is clear.  Eyes:     Extraocular Movements: Extraocular movements intact.     Conjunctiva/sclera: Conjunctivae normal.  Cardiovascular:     Rate and Rhythm: Regular rhythm. Tachycardia present.  Pulmonary:     Breath sounds: Wheezing present.     Comments: Increased respiratory effort Abdominal:     General: Abdomen is flat. Bowel sounds are normal.  There is no distension.     Palpations: Abdomen is soft. There is no mass.     Tenderness: There is no abdominal tenderness. There is no guarding or rebound.     Hernia: No hernia is present.  Musculoskeletal:        General: No swelling. Normal range of motion.     Cervical back: Normal range of motion and neck supple.  Skin:    General: Skin is warm.     Coloration: Skin is not jaundiced.  Neurological:     General: No focal deficit present.     Mental Status: He is alert and oriented to person, place, and time.  Psychiatric:        Mood and Affect: Mood normal.        Behavior: Behavior normal.        Thought Content: Thought content normal.        Judgment: Judgment normal.    Vital signs: Vitals:   06/01/21 1024 06/01/21 1121  BP: (!) 170/83 136/67  Pulse:  (!) 120  Resp: (!) 32 (!) 40  Temp: 98.2 F (36.8 C) 98.1 F (36.7 C)  SpO2:  96%   Last BM Date: 05/31/21    GI:  Lab Results: Recent Labs    05/30/21 1804 05/31/21 0415 06/01/21 0349  WBC 19.5* 15.8* 12.0*  HGB 13.2 11.6* 11.8*  HCT 40.2 34.7* 35.7*  PLT 274 232 274   BMET Recent Labs    05/30/21 1804 05/31/21 0415 06/01/21 0349  NA 131* 132* 133*  K 4.0 3.6 3.4*  CL 97* 101 104  CO2 $Re'22 24 24  'Fgz$ GLUCOSE 450*  266* 273*  BUN 38* 40* 24*  CREATININE 1.60* 1.57* 0.97  CALCIUM 9.1 8.5* 8.4*   LFT Recent Labs    06/01/21 0349  PROT 6.2*  ALBUMIN 2.7*  AST 318*  ALT 381*  ALKPHOS 181*  BILITOT 1.1   PT/INR No results for input(s): LABPROT, INR in the last 72 hours.   Studies/Results: US Abdomen Limited RUQ (LIVER/GB)  Result Date: 05/31/2021 CLINICAL DATA:  Elevated LFTs. EXAM: ULTRASOUND ABDOMEN LIMITED RIGHT UPPER QUADRANT COMPARISON:  CT scan 05/27/2021 FINDINGS: Gallbladder: No gallstones or wall thickening visualized. No sonographic Murphy sign noted by sonographer. Dependent sludge evident. Common bile duct: Diameter: 3 mm Liver: Coarsening of hepatic echotexture suggests fatty deposition. No focal abnormality evident. Portal vein is patent on color Doppler imaging with normal direction of blood flow towards the liver. Other: Small volume ascites IMPRESSION: Small volume ascites. Probable fatty deposition in the liver. Electronically Signed   By: Misty Stanley M.D.   On: 05/31/2021 06:37    Impression: Elevated LFTs; likely transaminitis - AST 318/ ALT 381/ Alk phos 181/ T. Bili 1.1 (trending upward from admission).  - BUN 24, Cr. 0.97 - HGB 11.8 - US abdomen 11/30: small volume ascites and fatty deposition in liver. Sludge in gallbladder.  CAP  DMII  Essential hypertension  Plan: No previous history of LFT elevation, normal bilirubin. Elevated LFTs likely transaminitis with current CAP being treated with ABX.  Would be happy to see outpatient to continue to trend LFTs and do further workup. No need for MRCP/ERCP at this time. Pain likely from pneumonia/pleural effusion. Continue supportive care and continue to trend LFTs.  [facility] will sign off. Please contact us if we can be of any further assistance during this hospital stay.    LOS: 1 day   Markeshia Giebel Radford Pax  PA-C 06/01/2021,  12:07 PM  Contact #  [phone]

## 2021-06-01 NOTE — Progress Notes (Signed)
Bilateral lower extremity venous duplex has been completed. Preliminary results can be found in CV Proc through chart review.   06/01/21 1:53 PM Olen Cordial RVT

## 2021-06-01 NOTE — Assessment & Plan Note (Signed)
Blood pressure elevated also has tachycardia. Will add Lopressor.

## 2021-06-01 NOTE — Assessment & Plan Note (Signed)
Potassium level mildly low.  Will replace.

## 2021-06-01 NOTE — Hospital Course (Addendum)
11/29 admitted with shortness of breath found to have sepsis due to pneumonia. 12/1 cultures so far negative.  LFTs worsening.  GI consulted and signed off.  CT PE protocol ordered. 12/2 underwent IR.  Drain placement 12/4 okay to DC from gallbladder standpoint per GS.  Discontinue BiPAP.  Ambulated in hallway without oxygen.  Antibiotics switched to p.o.

## 2021-06-01 NOTE — Assessment & Plan Note (Signed)
From poor p.o. intake.  Monitor. 

## 2021-06-02 ENCOUNTER — Inpatient Hospital Stay (HOSPITAL_COMMUNITY): Payer: Medicare Other

## 2021-06-02 DIAGNOSIS — J189 Pneumonia, unspecified organism: Secondary | ICD-10-CM | POA: Diagnosis not present

## 2021-06-02 DIAGNOSIS — K810 Acute cholecystitis: Secondary | ICD-10-CM | POA: Diagnosis present

## 2021-06-02 DIAGNOSIS — J9601 Acute respiratory failure with hypoxia: Secondary | ICD-10-CM | POA: Diagnosis not present

## 2021-06-02 DIAGNOSIS — A419 Sepsis, unspecified organism: Secondary | ICD-10-CM | POA: Diagnosis not present

## 2021-06-02 HISTORY — PX: IR PERC CHOLECYSTOSTOMY: IMG2326

## 2021-06-02 LAB — COMPREHENSIVE METABOLIC PANEL
ALT: 250 U/L — ABNORMAL HIGH (ref 0–44)
AST: 76 U/L — ABNORMAL HIGH (ref 15–41)
Albumin: 2.8 g/dL — ABNORMAL LOW (ref 3.5–5.0)
Alkaline Phosphatase: 200 U/L — ABNORMAL HIGH (ref 38–126)
Anion gap: 7 (ref 5–15)
BUN: 25 mg/dL — ABNORMAL HIGH (ref 8–23)
CO2: 23 mmol/L (ref 22–32)
Calcium: 8.5 mg/dL — ABNORMAL LOW (ref 8.9–10.3)
Chloride: 107 mmol/L (ref 98–111)
Creatinine, Ser: 1.02 mg/dL (ref 0.61–1.24)
GFR, Estimated: >60 mL/min (ref >60)
Glucose, Bld: 131 mg/dL — ABNORMAL HIGH (ref 70–99)
Potassium: 3.5 mmol/L (ref 3.5–5.1)
Sodium: 137 mmol/L (ref 135–145)
Total Bilirubin: 1 mg/dL (ref 0.3–1.2)
Total Protein: 6.4 g/dL — ABNORMAL LOW (ref 6.5–8.1)

## 2021-06-02 LAB — CBC WITH DIFFERENTIAL/PLATELET
Abs Immature Granulocytes: 0.29 10*3/uL — ABNORMAL HIGH (ref 0.00–0.07)
Basophils Absolute: 0.1 10*3/uL (ref 0.0–0.1)
Basophils Relative: 0 %
Eosinophils Absolute: 0.1 10*3/uL (ref 0.0–0.5)
Eosinophils Relative: 1 %
HCT: 35.7 % — ABNORMAL LOW (ref 39.0–52.0)
Hemoglobin: 12 g/dL — ABNORMAL LOW (ref 13.0–17.0)
Immature Granulocytes: 2 %
Lymphocytes Relative: 5 %
Lymphs Abs: 0.7 10*3/uL (ref 0.7–4.0)
MCH: 30.5 pg (ref 26.0–34.0)
MCHC: 33.6 g/dL (ref 30.0–36.0)
MCV: 90.8 fL (ref 80.0–100.0)
Monocytes Absolute: 2.2 10*3/uL — ABNORMAL HIGH (ref 0.1–1.0)
Monocytes Relative: 16 %
Neutro Abs: 10.2 10*3/uL — ABNORMAL HIGH (ref 1.7–7.7)
Neutrophils Relative %: 76 %
Platelets: 299 10*3/uL (ref 150–400)
RBC: 3.93 MIL/uL — ABNORMAL LOW (ref 4.22–5.81)
RDW: 13.5 % (ref 11.5–15.5)
WBC: 13.6 10*3/uL — ABNORMAL HIGH (ref 4.0–10.5)
nRBC: 0 % (ref 0.0–0.2)

## 2021-06-02 LAB — PROTIME-INR
INR: 1 (ref 0.8–1.2)
Prothrombin Time: 13 seconds (ref 11.4–15.2)

## 2021-06-02 LAB — GLUCOSE, CAPILLARY
Glucose-Capillary: 101 mg/dL — ABNORMAL HIGH (ref 70–99)
Glucose-Capillary: 133 mg/dL — ABNORMAL HIGH (ref 70–99)
Glucose-Capillary: 210 mg/dL — ABNORMAL HIGH (ref 70–99)
Glucose-Capillary: 84 mg/dL (ref 70–99)
Glucose-Capillary: 87 mg/dL (ref 70–99)
Glucose-Capillary: 96 mg/dL (ref 70–99)

## 2021-06-02 LAB — MAGNESIUM: Magnesium: 2.4 mg/dL (ref 1.7–2.4)

## 2021-06-02 MED ORDER — FENTANYL CITRATE (PF) 100 MCG/2ML IJ SOLN
INTRAMUSCULAR | Status: AC | PRN
  Administered 2021-06-02: 50 ug via INTRAVENOUS

## 2021-06-02 MED ORDER — SODIUM CHLORIDE (PF) 0.9 % IJ SOLN
INTRAMUSCULAR | Status: AC
  Filled 2021-06-02: qty 50

## 2021-06-02 MED ORDER — IOHEXOL 350 MG/ML SOLN
75.0000 mL | Freq: Once | INTRAVENOUS | Status: AC | PRN
  Administered 2021-06-02: 75 mL via INTRAVENOUS

## 2021-06-02 MED ORDER — IOHEXOL 300 MG/ML  SOLN
15.0000 mL | Freq: Once | INTRAMUSCULAR | Status: AC | PRN
  Administered 2021-06-02: 5 mL

## 2021-06-02 MED ORDER — SODIUM CHLORIDE 0.9% FLUSH
5.0000 mL | Freq: Three times a day (TID) | INTRAVENOUS | Status: DC
  Administered 2021-06-02 – 2021-06-05 (×6): 5 mL

## 2021-06-02 MED ORDER — ENOXAPARIN SODIUM 40 MG/0.4ML IJ SOSY
40.0000 mg | PREFILLED_SYRINGE | INTRAMUSCULAR | Status: DC
  Administered 2021-06-03 – 2021-06-05 (×3): 40 mg via SUBCUTANEOUS
  Filled 2021-06-02 (×3): qty 0.4

## 2021-06-02 MED ORDER — IOHEXOL 350 MG/ML SOLN: 100.0000 mL | Freq: Once | INTRAVENOUS | Status: DC | PRN

## 2021-06-02 MED ORDER — FENTANYL CITRATE (PF) 100 MCG/2ML IJ SOLN
INTRAMUSCULAR | Status: AC
  Filled 2021-06-02: qty 4

## 2021-06-02 MED ORDER — DOCUSATE SODIUM 100 MG PO CAPS
100.0000 mg | ORAL_CAPSULE | Freq: Every day | ORAL | Status: DC | PRN
  Administered 2021-06-02: 100 mg via ORAL
  Filled 2021-06-02: qty 1

## 2021-06-02 MED ORDER — LIDOCAINE HCL 1 % IJ SOLN
INTRAMUSCULAR | Status: AC
  Filled 2021-06-02: qty 20

## 2021-06-02 MED ORDER — DIATRIZOATE MEGLUMINE & SODIUM 66-10 % PO SOLN
15.0000 mL | ORAL | Status: AC
  Administered 2021-06-02: 15 mL via ORAL
  Filled 2021-06-02: qty 30

## 2021-06-02 MED ORDER — DIATRIZOATE MEGLUMINE & SODIUM 66-10 % PO SOLN
ORAL | Status: AC
  Administered 2021-06-02: 15 mL via ORAL
  Filled 2021-06-02: qty 30

## 2021-06-02 NOTE — H&P (Addendum)
Chief Complaint: Patient was seen in consultation today for image guided percutaneous cholecystostomy placement at the request of Margie Billet, [place]  Referring Physician(s): Margie Billet, [place]  Supervising Physician: Mir, Sharen Heck  Patient Status: [facility] - In-pt  History of Present Illness: Andrew Duncan is a 75 y.o. male with PMH of anemia, childhood asthma, diabetes, HTN, hypercholesterolemia, HLD and retinopathy. Presented to the ED 05/27/2021 with nausea and vomiting.  Patient's labs and imaging was negative and was sent home.  Patient returned to ED 05/30/2021 with weakness, shortness of breath, productive cough and right-sided chest pain.  Chest x-ray at that time showed right-sided pneumonia with pleural effusion.  Labs resulted in acute renal failure, increase in AST and ALT.  Patient's initial lactate was 2.4 concerning for sepsis.  CT scan shows thickening of gallbladder wall possibly cholecystitis.  [place], [place] has referred patient to IR for percutaneous cholecystostomy placement.  Procedure was approved by Dr. Dwaine Gale.  Past Medical History:  Diagnosis Date   Anemia    Childhood asthma    Diabetes ([employer])    Essential hypertension    Hypercholesterolemia    Hyperlipidemia    Polyneuropathy    Retinopathy     Past Surgical History:  Procedure Laterality Date   ANAL FISSURE REPAIR     left elbow fracture pinning     pinning    Allergies: Versed [midazolam]  Medications: Prior to Admission medications   Medication Sig Start Date End Date Taking? Authorizing Provider  aspirin EC 81 MG tablet Take 81 mg by mouth daily.   Yes [provider]  cholecalciferol (VITAMIN D) 25 MCG (1000 UNIT) tablet Take 1,000 Units by mouth daily.   Yes [provider]  glipiZIDE (GLUCOTROL XL) 10 MG 24 hr tablet Take 10 mg by mouth daily. 02/28/18  Yes [provider]  JANUVIA 100 MG tablet Take 100 mg by mouth daily. 02/24/18  Yes [provider]   LANTUS SOLOSTAR 100 UNIT/ML Solostar Pen 47 Units at bedtime. 02/14/18  Yes [provider]  latanoprost (XALATAN) 0.005 % ophthalmic solution Place 1 drop into both eyes at bedtime. 01/31/18  Yes [provider]  losartan (COZAAR) 25 MG tablet Take 25 mg by mouth daily. 03/21/18  Yes [provider]  metFORMIN (GLUCOPHAGE) 500 MG tablet Take 500 mg by mouth 2 (two) times daily with a meal. 03/17/18  Yes [provider]  Multiple Vitamin (MULTIVITAMIN WITH MINERALS) TABS tablet Take 1 tablet by mouth daily.   Yes [provider]  pravastatin (PRAVACHOL) 20 MG tablet Take 20 mg by mouth daily. 02/03/18  Yes [provider]  Semaglutide (OZEMPIC, 2 MG/DOSE, [place]) Inject 0.5 mg into the skin once a week. Every Friday   Yes [provider]  timolol (TIMOPTIC) 0.5 % ophthalmic solution Place 1 drop into both eyes 2 (two) times daily. 01/07/18  Yes [provider]  Albuterol Sulfate (PROAIR HFA IN) Inhale into the lungs. Patient not taking: Reported on 05/30/2021    [provider]  BD PEN NEEDLE NANO U/F 32G X 4 MM MISC USE NEEDLES FOR INJECTION TWICE A DAY 01/30/18   [provider]  DIPHENHYDRAMINE HCL PO Take 10 mg by mouth. Patient not taking: Reported on 05/30/2021    [provider]  ondansetron (ZOFRAN) 4 MG tablet Take 1 tablet (4 mg total) by mouth every 6 (six) hours. Patient not taking: Reported on 05/30/2021 05/27/21   Rodney Booze, PA-C  Family History  Problem Relation Age of Onset   Diabetes Mother    Stroke Mother     Social History   Socioeconomic History   Marital status: Married    Spouse name: Not on file   Number of children: 2   Years of education: Not on file   Highest education level: Doctorate  Occupational History   Occupation: Art therapist at [employer]  Tobacco Use   Smoking status: Never   Smokeless tobacco: Never  Vaping Use   Vaping Use: Never used  Substance and  Sexual Activity   Alcohol use: Not Currently   Drug use: Never   Sexual activity: Not on file  Other Topics Concern   Not on file  Social History Narrative   Lives with wife in a 2 story home.  Has 2 children.  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  Education: PhD.   Social Determinants of Health   Financial Resource Strain: Not on file  Food Insecurity: Not on file  Transportation Needs: Not on file  Physical Activity: Not on file  Stress: Not on file  Social Connections: Not on file     Review of Systems: A 12 point ROS discussed and pertinent positives are indicated in the HPI above.  All other systems are negative.  Review of Systems  Constitutional:  Negative for chills and fever.  HENT:  Negative for nosebleeds.   Eyes:  Negative for visual disturbance.  Respiratory:  Positive for shortness of breath.   Cardiovascular:  Positive for chest pain.  Gastrointestinal:  Positive for abdominal pain, constipation and nausea. Negative for blood in stool and vomiting.  Genitourinary:  Negative for hematuria.  Neurological:  Positive for weakness. Negative for dizziness, light-headedness and headaches.   Vital Signs: BP (!) 151/81 (BP Location: Left Arm)   Pulse 99   Temp 98.9 F (37.2 C)   Resp (!) 35   Ht 5\' 5"  (1.651 m)   Wt 178 lb 4.8 oz (80.9 kg)   SpO2 98%   BMI 29.67 kg/m   Physical Exam Constitutional:      Appearance: He is ill-appearing and diaphoretic.  HENT:     Head: Normocephalic and atraumatic.     Nose:     Comments: Groom to O2    Mouth/Throat:     Mouth: Mucous membranes are dry.     Pharynx: Oropharynx is clear.  Cardiovascular:     Rate and Rhythm: Regular rhythm. Tachycardia present.     Pulses: Normal pulses.     Heart sounds: Normal heart sounds. No murmur heard.   No friction rub. No gallop.  Pulmonary:     Effort: Respiratory distress present.     Breath sounds: No stridor. No wheezing, rhonchi or rales.  Abdominal:     General: Bowel sounds are  normal. There is no distension.     Palpations: Abdomen is soft.     Tenderness: There is abdominal tenderness. There is no guarding.  Musculoskeletal:     Right lower leg: No edema.     Left lower leg: No edema.  Skin:    General: Skin is warm.  Neurological:     Mental Status: He is alert and oriented to person, place, and time.  Psychiatric:        Mood and Affect: Mood normal.        Behavior: Behavior normal.        Thought Content: Thought content normal.        Judgment:  Judgment normal.    Imaging: DG Chest 2 View  Result Date: 05/30/2021 CLINICAL DATA:  Abnormal breath sounds. EXAM: CHEST - 2 VIEW COMPARISON:  05/27/2021 FINDINGS: Right base collapse/consolidation with small right pleural effusion noted. Similar asymmetric elevation right hemidiaphragm. Left lung clear. No pulmonary edema or evidence of pneumothorax. The cardiopericardial silhouette is within normal limits for size. The visualized bony structures of the thorax show no acute abnormality. IMPRESSION: Right base collapse/consolidation with small right pleural effusion. Electronically Signed   By: Misty Stanley M.D.   On: 05/30/2021 10:25   DG Chest 2 View  Result Date: 05/27/2021 CLINICAL DATA:  Patient complains of nausea, vomiting, and weakness for 3 days. EXAM: CHEST - 2 VIEW COMPARISON:  Chest radiograph 07/09/2018 FINDINGS: The heart size and mediastinal contours are within normal limits. Both lungs are clear. No acute osseous abnormality. IMPRESSION: No active cardiopulmonary disease. Electronically Signed   By: Ileana Roup M.D.   On: 05/27/2021 14:38   CT Angio Chest Pulmonary Embolism (PE) W or WO Contrast  Addendum Date: 06/01/2021   ADDENDUM REPORT: 06/01/2021 17:47 ADDENDUM: These results were called by telephone at the time of interpretation on 06/01/2021 at 5:47 pm to provider [facility] PATEL , who verbally acknowledged these results. Electronically Signed   By: Markus Daft M.D.   On: 06/01/2021 17:47    Result Date: 06/01/2021 CLINICAL DATA:  Pulmonary embolism suspected, high probability. Elevated D-dimer. EXAM: CT ANGIOGRAPHY CHEST WITH CONTRAST TECHNIQUE: Multidetector CT imaging of the chest was performed using the standard protocol during bolus administration of intravenous contrast. Multiplanar CT image reconstructions and MIPs were obtained to evaluate the vascular anatomy. CONTRAST:  34mL OMNIPAQUE IOHEXOL 350 MG/ML SOLN COMPARISON:  CT abdomen pelvis 05/27/2021 and ultrasound 05/31/2021 FINDINGS: Cardiovascular: Suboptimal evaluation for pulmonary emboli due to the timing of the exam. Large amount of the contrast is already in the thoracic aorta. However, the main pulmonary arteries are patent. Cannot evaluate the lobar and more distal pulmonary artery branches. Normal caliber of the thoracic aorta. Heart size is normal without significant pericardial effusion. Mediastinum/Nodes: No significant lymph node enlargement in the mediastinum. No axillary lymph node enlargement. Lungs/Pleura: Compared to 05/27/2021, there is marked volume loss and consolidation in the right lower chest with a small amount of loculated right pleural fluid. Minimal aeration in the right lower lobe or right middle lobe. Significant motion artifact in the lungs. No significant airspace disease or consolidation in left lung. No significant left pleural fluid. Upper Abdomen: Gallbladder appears to be distended with wall irregularity. This represents a change from the previous CT. Small amount of high-density material in the gallbladder could represent sludge or small stones. In addition, there is small amount of perihepatic ascites which may be within the subcapsular space. This perihepatic fluid is new from the previous CT but there was fluid in this area on the recent ultrasound. The entire abdomen is not imaged. No significant gastric distension. Musculoskeletal: No acute bone abnormality. Review of the MIP images confirms the  above findings. IMPRESSION: 1. Volume loss and consolidation in right lower lobe and right middle lobe. Small loculated right pleural effusion. Findings could be secondary to pneumonia. 2. Limited evaluation for pulmonary embolism due to motion artifact and poor opacification of the pulmonary arteries. However, the main pulmonary arteries are patent and no evidence for a large central pulmonary embolism. 3. Abnormal appearance of the gallbladder with irregular fluid collections along the inferior aspect of the liver. Significant change in the gallbladder  since 05/27/2021 and findings are concerning for acute cholecystitis. Abdominal findings could be better characterized with a dedicated CT of the abdomen and pelvis with IV contrast. Electronically Signed: By: Markus Daft M.D. On: 06/01/2021 17:37   CT ABDOMEN PELVIS W CONTRAST  Result Date: 05/27/2021 CLINICAL DATA:  Nausea and vomiting EXAM: CT ABDOMEN AND PELVIS WITH CONTRAST TECHNIQUE: Multidetector CT imaging of the abdomen and pelvis was performed using the standard protocol following bolus administration of intravenous contrast. CONTRAST:  141mL OMNIPAQUE IOHEXOL 300 MG/ML  SOLN COMPARISON:  None. FINDINGS: Lower chest: No acute abnormality. Hepatobiliary: No focal liver abnormality. Nonspecific hydropic gallbladder. Punctate hyperdensity layering within the gallbladder lumen may represent tiny gallstones. No gallbladder wall thickening or pericholecystic fluid. No biliary dilatation. Pancreas: No focal lesion. Normal pancreatic contour. No surrounding inflammatory changes. No main pancreatic ductal dilatation. Spleen: Normal in size without focal abnormality. Adrenals/Urinary Tract: No adrenal nodule bilaterally. Bilateral kidneys enhance symmetrically. No hydronephrosis. No hydroureter. The urinary bladder is unremarkable. Stomach/Bowel: Stomach is within normal limits. No evidence of bowel wall thickening or dilatation. Scattered colonic  diverticulosis. Appendix appears normal. Vascular/Lymphatic: No abdominal aorta or iliac aneurysm. Mild atherosclerotic plaque of the aorta and its branches. No abdominal, pelvic, or inguinal lymphadenopathy. Reproductive: The prostate is enlarged measuring up to 5 cm. Other: No intraperitoneal free fluid. No intraperitoneal free gas. No organized fluid collection. Musculoskeletal: No abdominal wall hernia or abnormality. No suspicious lytic or blastic osseous lesions. No acute displaced fracture. L5-S1 intervertebral disc space vacuum phenomenon and endplate sclerosis. IMPRESSION: 1. Possible cholelithiasis. 2. Colonic diverticulosis with no acute diverticulitis. 3. Prostatomegaly. 4.  Aortic Atherosclerosis (ICD10-I70.0). Electronically Signed   By: Iven Finn M.D.   On: 05/27/2021 15:39   VAS [place] LOWER EXTREMITY VENOUS (DVT)  Result Date: 06/01/2021  Lower Venous DVT Study Patient Name:  YADER POLMAN  Date of Exam:   06/01/2021 Medical Rec #: [id number]       Accession #:    [id number] Date of Birth: 1945/12/30       Patient Gender: M Patient Age:   26 years Exam Location:  [facility] Procedure:      VAS [place] LOWER EXTREMITY VENOUS (DVT) Referring Phys: PRANAV PATEL --------------------------------------------------------------------------------  Indications: Edema.  Risk Factors: None identified. Comparison Study: No prior studies. Performing Technologist: Oliver Hum RVT  Examination Guidelines: A complete evaluation includes B-mode imaging, spectral Doppler, color Doppler, and power Doppler as needed of all accessible portions of each vessel. Bilateral testing is considered an integral part of a complete examination. Limited examinations for reoccurring indications may be performed as noted. The reflux portion of the exam is performed with the patient in reverse Trendelenburg.  +---------+---------------+---------+-----------+----------+--------------+ RIGHT     CompressibilityPhasicitySpontaneityPropertiesThrombus Aging +---------+---------------+---------+-----------+----------+--------------+ CFV      Full           Yes      Yes                                 +---------+---------------+---------+-----------+----------+--------------+ SFJ      Full                                                        +---------+---------------+---------+-----------+----------+--------------+ FV Prox  Full                                                        +---------+---------------+---------+-----------+----------+--------------+  FV Mid   Full                                                        +---------+---------------+---------+-----------+----------+--------------+ FV DistalFull                                                        +---------+---------------+---------+-----------+----------+--------------+ PFV      Full                                                        +---------+---------------+---------+-----------+----------+--------------+ POP      Full           Yes      Yes                                 +---------+---------------+---------+-----------+----------+--------------+ PTV      Full                                                        +---------+---------------+---------+-----------+----------+--------------+ PERO     Full                                                        +---------+---------------+---------+-----------+----------+--------------+   +---------+---------------+---------+-----------+----------+--------------+ LEFT     CompressibilityPhasicitySpontaneityPropertiesThrombus Aging +---------+---------------+---------+-----------+----------+--------------+ CFV      Full           Yes      Yes                                 +---------+---------------+---------+-----------+----------+--------------+ SFJ      Full                                                         +---------+---------------+---------+-----------+----------+--------------+ FV Prox  Full                                                        +---------+---------------+---------+-----------+----------+--------------+ FV Mid   Full                                                        +---------+---------------+---------+-----------+----------+--------------+  FV DistalFull                                                        +---------+---------------+---------+-----------+----------+--------------+ PFV      Full                                                        +---------+---------------+---------+-----------+----------+--------------+ POP      Full           Yes      Yes                                 +---------+---------------+---------+-----------+----------+--------------+ PTV      Full                                                        +---------+---------------+---------+-----------+----------+--------------+ PERO     Full                                                        +---------+---------------+---------+-----------+----------+--------------+     Summary: RIGHT: - There is no evidence of deep vein thrombosis in the lower extremity.  - No cystic structure found in the popliteal fossa.  LEFT: - There is no evidence of deep vein thrombosis in the lower extremity.  - No cystic structure found in the popliteal fossa.  *See table(s) above for measurements and observations. Electronically signed by Sherald Hess MD on 06/01/2021 at 5:30:44 PM.    Final    US Abdomen Limited RUQ (LIVER/GB)  Result Date: 05/31/2021 CLINICAL DATA:  Elevated LFTs. EXAM: ULTRASOUND ABDOMEN LIMITED RIGHT UPPER QUADRANT COMPARISON:  CT scan 05/27/2021 FINDINGS: Gallbladder: No gallstones or wall thickening visualized. No sonographic Murphy sign noted by sonographer. Dependent sludge evident. Common bile duct: Diameter: 3 mm Liver: Coarsening  of hepatic echotexture suggests fatty deposition. No focal abnormality evident. Portal vein is patent on color Doppler imaging with normal direction of blood flow towards the liver. Other: Small volume ascites IMPRESSION: Small volume ascites. Probable fatty deposition in the liver. Electronically Signed   By: Kennith Center M.D.   On: 05/31/2021 06:37    Labs:  CBC: Recent Labs    05/30/21 1804 05/31/21 0415 06/01/21 0349 06/02/21 0420  WBC 19.5* 15.8* 12.0* 13.6*  HGB 13.2 11.6* 11.8* 12.0*  HCT 40.2 34.7* 35.7* 35.7*  PLT 274 232 274 299    COAGS: No results for input(s): INR, APTT in the last 8760 hours.  BMP: Recent Labs    05/30/21 1804 05/31/21 0415 06/01/21 0349 06/02/21 0420  NA 131* 132* 133* 137  K 4.0 3.6 3.4* 3.5  CL 97* 101 104 107  CO2 22 24 24 23   GLUCOSE 450* 266* 273* 131*  BUN 38* 40* 24* 25*  CALCIUM 9.1 8.5* 8.4* 8.5*  CREATININE 1.60* 1.57* 0.97 1.02  GFRNONAA 45* 46* >60 >60    LIVER FUNCTION TESTS: Recent Labs    05/30/21 1804 05/31/21 0415 06/01/21 0349 06/02/21 0420  BILITOT 0.9 0.6 1.1 1.0  AST 86* 71* 318* 76*  ALT 162* 138* 381* 250*  ALKPHOS 111 103 181* 200*  PROT 7.1 6.3* 6.2* 6.4*  ALBUMIN 3.2* 2.7* 2.7* 2.8*    TUMOR MARKERS: No results for input(s): AFPTM, CEA, CA199, CHROMGRNA in the last 8760 hours.  Assessment and Plan: History of anemia, childhood asthma, diabetes, HTN, hypercholesterolemia, HLD and retinopathy. Presented to the ED 05/27/2021 with nausea and vomiting.  Patient's labs and imaging was negative and was sent home.  Patient returned to ED 05/30/2021 with weakness, shortness of breath, productive cough and right-sided chest pain.  Chest x-ray at that time showed right-sided pneumonia with pleural effusion.  Labs resulted in acute renal failure, increase in AST and ALT.  Patient's initial lactate was 2.4 concerning for sepsis.  CT scan shows thickening of gallbladder wall possibly cholecystitis.  [place], [place]  has referred patient to IR for percutaneous cholecystostomy placement.  Procedure was approved by Dr. Dwaine Gale.  Patient observed to be sitting upright in bed.  Patient is extremely tachypneic, can only speak in short sentences due to his respiratory distress.  He is alert and oriented, calm and pleasant. Patient states he wishes to proceed with drain placement he will feel better.  Patient is in agreement to receive only 1 medication during procedure due to his respiratory distress. Patient has an allergy to Versed.  Risks and benefits of percutaneous cholecystostomy placement discussed with the patient including bleeding, infection, damage to adjacent structures, bowel perforation/fistula connection, and sepsis.  All of the patient's questions were answered, patient is agreeable to proceed. Consent signed and in chart.   Thank you for this interesting consult.  I greatly enjoyed meeting KAYLEE TODOROVICH and look forward to participating in their care.  A copy of this report was sent to the requesting provider on this date.  Electronically Signed: Tyson Alias, NP 06/02/2021, 8:25 AM   I spent a total of 30 minutes in face to face in clinical consultation, greater than 50% of which was counseling/coordinating care for percutaneous cholecystostomy placement.

## 2021-06-02 NOTE — Assessment & Plan Note (Signed)
Resolved. Monitor.  

## 2021-06-02 NOTE — Assessment & Plan Note (Signed)
Lower extremity Doppler negative for DVT. Suspect this is volume overload.

## 2021-06-02 NOTE — Assessment & Plan Note (Addendum)
Presents with pneumonia.  Her respiratory status progressively worsening despite aggressive measures with antibiotics and pulmonary toilet. Required BiPAP therapy due to severe respiratory distress. Continue as needed for now. Received 1 dose of IV Lasix. Continue incentive spirometry.

## 2021-06-02 NOTE — Progress Notes (Signed)
PT found off BiPAP and on 2 LPM- Sp02 96%, HR 113, RR 25. PT is currently drinking contrast for IR drain placement. Spoke with RN concerning PT current condition in regards to PT needing to drink contrast. RN agreed to call IR and attempt to get PT seen (sooner than later). Spoke with PT and encouraged to call for RN if SOB gets any worse. Marland Kitchen

## 2021-06-02 NOTE — Assessment & Plan Note (Addendum)
Patient is having ongoing abdominal pain.  Seen in ER on 11/26. CT abdomen negative for any acute abnormality had some cholelithiasis.  On presentation with right upper quadrant pain ultrasound abdomen negative but had a pneumonia on the same side. CT chest performed for pneumonia and respiratory distress showed gallbladder wall thickening with concern for cholecystitis as well as microperforation. Repeat CT abdomen on 12/2 shows evidence of liver abscess, microperforation with cholecystitis. General surgery and IR consulted, antibiotics changed to IV Zosyn.  Not a candidate for cholecystectomy right now due to respiratory distress and pneumonia. Patient underwent IR guided PERC cholecystostomy drain placement. Pain improved.  Follow-up on cultures.  So far no growth.  Transition from IV Zosyn to Augmentin. Patient will require cholangiogram at the time of drain removal or intraoperative.

## 2021-06-02 NOTE — Progress Notes (Signed)
[facility] Progress Note  Patient: Andrew Duncan    FVC:[id number]  DOA: 05/30/2021    Date of Service: the patient was seen and examined on 06/02/2021  Brief hospital course: 11/29 admitted with shortness of breath found to have sepsis due to pneumonia. 12/1 cultures so far negative.  LFTs worsening.  GI consulted and signed off.  CT PE protocol ordered.  Assessment and Plan: * Sepsis due to pneumonia ([place]) Met SIRS criteria on admission with tachycardia tachypnea and fever, leukocytosis.   X-ray showed mild pneumonia on the right lower lung zone.  CT shows dense consolidation on the right.  Also small pleural effusion. Blood cultures so far negative.   Continue with IV antibiotics.  Initially was on 1 g ceftriaxone, increase to 2 g ceftriaxone now on IV Zosyn.  Acute respiratory failure with hypoxia (HCC) Presents with pneumonia.  Her respiratory status progressively worsening despite aggressive measures with antibiotics and pulmonary toilet. Required BiPAP therapy due to severe respiratory distress. Received 1 dose of IV Lasix. Continue incentive spirometry.   Acute cholecystitis Patient is having ongoing abdominal pain.  Seen in ER on 11/26.  CT abdomen negative for any acute abnormality had some cholelithiasis.  On presentation with right upper quadrant pain ultrasound abdomen negative but had a pneumonia on the same side. CT chest performed for pneumonia and respiratory distress showed gallbladder wall thickening with concern for cholecystitis as well as microperforation. Regular CT abdomen on 12/2 shows evidence of liver abscess, microperforation with cholecystitis. General surgery and IR consulted, antibiotics changed to IV Zosyn.  Not a candidate for cholecystectomy right now due to respiratory distress and pneumonia. Patient underwent IR guided PERC cholecystostomy drain placement. Pain improved.  Follow-up on cultures.  Essential hypertension Blood pressure elevated  also has tachycardia. Will add Lopressor.  Transaminitis LFTs trending upward from normal on 11/26 to in 300s and 12/1. Now trending down. GI consulted appreciate assistance.  Currently signed off. Ultrasound abdomen negative for any acute abnormality.  Shows small volume ascites. avoiding hepatotoxic medication.  Hyponatremia Resolved.  Monitor.  Hypokalemia Monitor.  Pedal edema Lower extremity Doppler negative for DVT. Suspect this is volume overload.  Type 2 diabetes mellitus without complication, with long-term current use of insulin (HCC) Hemoglobin A1c 8.6 Uncontrolled with hyperglycemia Continue sliding scale insulin.  Continue Lantus although no lower side secondary to poor p.o. intake.  Hyperlipidemia Not on any statin.    Body mass index is 29.67 kg/m.        Subjective: Abdominal pain improving but still present.  No nausea no vomiting.  Still has shortness of breath.  No chest pain.   Objective: Tachycardic and tachypneic.  Blood pressure elevated.  Exam: General: Appear in mild distress, no Rash; Oral Mucosa Clear, moist. no Abnormal Neck Mass Or lumps, Conjunctiva normal  Cardiovascular: S1 and S2 Present, no Murmur, Respiratory: increased respiratory effort, Bilateral Air entry present and bilateral  Crackles, no wheezes Abdomen: Bowel Sound present, Soft and no tenderness Extremities: trace Pedal edema Neurology: alert and oriented to time, place, and person affect appropriate. no new focal deficit Gait not checked due to patient safety concerns     Data Reviewed: My review of labs, imaging, notes and other tests is significant for     improving LFTs.  Disposition:  Status is: Inpatient  Remains inpatient appropriate because: Ongoing respiratory distress requiring as needed BiPAP, on IV antibiotics, high potential for decompensation.   Family Communication: Wife at bedside.  DVT Prophylaxis: enoxaparin (  LOVENOX) injection 40 mg Start:  06/03/21 1000   Time spent: 35 minutes.   Author: Berle Mull  06/02/2021 6:04 PM  To reach On-call, see care teams to locate the attending and reach out via [email or web address]. Between 7PM-7AM, please contact night-coverage If you still have difficulty reaching the attending provider, please page the [facility] (Director on Call) for [facility] on amion for assistance.

## 2021-06-02 NOTE — Assessment & Plan Note (Addendum)
Met SIRS criteria on admission with tachycardia tachypnea and fever, leukocytosis.   X-ray showed mild pneumonia on the right lower lung zone.  CT shows dense consolidation on the right.  Also small pleural effusion. Blood cultures so far negative.   Initially was on 1 g ceftriaxone, increase to 2 g ceftriaxone now on IV Zosyn. Transition to oral Augmentin.  Continue azithromycin for 3 more days to cover atypical organism.

## 2021-06-02 NOTE — Progress Notes (Signed)
Placed PT on BiPAP at 1145 for SOB (slightly better than earlier). PT states he is receiving enough air on inspiration and able to breathe out comfortably on expiration. RN aware.

## 2021-06-02 NOTE — Plan of Care (Signed)
Pt satisfied with pain regimen. Pt completed CT and IR drain placement. Pt reports improvement in breathing after drain placement.   Problem: Education: Goal: Knowledge of General Education information will improve Description: Including pain rating scale, medication(s)/side effects and non-pharmacologic comfort measures Outcome: Progressing   Problem: Health Behavior/Discharge Planning: Goal: Ability to manage health-related needs will improve Outcome: Progressing   Problem: Clinical Measurements: Goal: Ability to maintain clinical measurements within normal limits will improve Outcome: Progressing Goal: Will remain free from infection Outcome: Progressing Goal: Diagnostic test results will improve Outcome: Progressing Goal: Respiratory complications will improve Outcome: Progressing Goal: Cardiovascular complication will be avoided Outcome: Progressing   Problem: Activity: Goal: Risk for activity intolerance will decrease Outcome: Progressing   Problem: Nutrition: Goal: Adequate nutrition will be maintained Outcome: Progressing   Problem: Coping: Goal: Level of anxiety will decrease Outcome: Progressing   Problem: Pain Managment: Goal: General experience of comfort will improve Outcome: Progressing   Problem: Safety: Goal: Ability to remain free from injury will improve Outcome: Progressing   Problem: Skin Integrity: Goal: Risk for impaired skin integrity will decrease Outcome: Progressing

## 2021-06-02 NOTE — Assessment & Plan Note (Addendum)
Blood pressure elevated also has tachycardia. Currently on Lopressor and hydralazine.  We will continue.

## 2021-06-02 NOTE — Procedures (Signed)
Interventional Radiology Procedure Note  Procedure: Cholecystostomy Drain Placement  Indication: Acute Cholecystitis  Findings: Please refer to procedural dictation for full description.  Complications: None  EBL: < 10 mL  Acquanetta Belling, MD [phone]

## 2021-06-02 NOTE — Progress Notes (Signed)
[facility] Progress Note  Andrew Duncan 75 y.o. 06/05/1946  CC:  Elevated LFTS   Subjective: Lfts beginning to normalize. Patient denies abdominal pain, nausea, vomiting, melena, hematochezia.  ROS : Review of Systems  Cardiovascular:  Negative for chest pain and palpitations.  Gastrointestinal:  Negative for abdominal pain, blood in stool, constipation, diarrhea, heartburn, melena, nausea and vomiting.     Objective: Vital signs in last 24 hours: Vitals:   06/02/21 0330 06/02/21 0359  BP:  (!) 151/81  Pulse: 91 99  Resp: 18 (!) 35  Temp:  98.9 F (37.2 C)  SpO2: 95% 98%    Physical Exam:  General:  Alert, cooperative, no distress, appears stated age  Head:  Normocephalic, without obvious abnormality, atraumatic  Eyes:  Anicteric sclera, EOM's intact  Lungs:   Clear to auscultation bilaterally, respirations shallow  Heart:  Tachycardic, regular rhythm  Abdomen:   Soft, non-tender, bowel sounds active all four quadrants,  no masses,   Extremities: Extremities normal, atraumatic, no  edema  Pulses: 2+ and symmetric    Lab Results: Recent Labs    06/01/21 0349 06/02/21 0420  NA 133* 137  K 3.4* 3.5  CL 104 107  CO2 24 23  GLUCOSE 273* 131*  BUN 24* 25*  CREATININE 0.97 1.02  CALCIUM 8.4* 8.5*  MG 2.3 2.4   Recent Labs    06/01/21 0349 06/02/21 0420  AST 318* 76*  ALT 381* 250*  ALKPHOS 181* 200*  BILITOT 1.1 1.0  PROT 6.2* 6.4*  ALBUMIN 2.7* 2.8*   Recent Labs    06/01/21 0349 06/02/21 0420  WBC 12.0* 13.6*  NEUTROABS 9.1* 10.2*  HGB 11.8* 12.0*  HCT 35.7* 35.7*  MCV 92.5 90.8  PLT 274 299   No results for input(s): LABPROT, INR in the last 72 hours.    Assessment Elevated LFTs; likely transaminitis secondary to pneumonia - AST 76 (318), ALT 250 (381), Alk phos 200 (181); improving - T. Bili 1.0  - BUN 25, Cr. 1.02 - HGB 12.0 - US abdomen 11/30: small volume ascites and fatty deposition in liver. Sludge in gallbladder.    CAP - Leukocytosis wbc 13.8   DMII   Essential hypertension  Plan: LFTs beginning to normalize, likely transaminitis secondary to pneumonia.  Follow LFTs to normalization and can see as an outpatient as needed. [facility] will sign off. Please contact us if we can be of any further assistance during this hospital stay.   [name] PA-C 06/02/2021, 8:38 AM  Contact #  [phone]  

## 2021-06-02 NOTE — Assessment & Plan Note (Addendum)
Hemoglobin A1c 8.6 Uncontrolled with hyperglycemia Continue sliding scale insulin. Continue Lantus although on lower side secondary to poor p.o. intake.  35 units.

## 2021-06-02 NOTE — Progress Notes (Signed)
Inpatient Diabetes Program Recommendations  AACE/ADA: New Consensus Statement on Inpatient Glycemic Control (2015)  Target Ranges:  Prepandial:   less than 140 mg/dL      Peak postprandial:   less than 180 mg/dL (1-2 hours)      Critically ill patients:  140 - 180 mg/dL   Lab Results  Component Value Date   GLUCAP 87 06/02/2021   HGBA1C 8.6 (H) 06/01/2021   Review of Glycemic Control   Latest Reference Range & Units 06/01/21 09:35 06/01/21 14:57 06/01/21 18:55 06/01/21 20:19 06/01/21 23:43 06/02/21 03:56  Glucose-Capillary 70 - 99 mg/dL 341 (H) 962 (H) 229 (H) 281 (H) 253 (H) 133 (H)    Latest Reference Range & Units 06/02/21 07:46  Glucose-Capillary 70 - 99 mg/dL 87   Diabetes history: DM 2 Outpatient Diabetes medications: Lantus 47 units QHS, Januvia 100 mg QD, glipizide 10 mg QD, metformin 500 mg BID, Ozempic 0.5 mg weekly  Current orders for Inpatient glycemic control:  Novolog 0-15 units Q4 Semglee 30 units qhs  Inpatient Diabetes Program Recommendations:    Glucose trends lower today   -  may benefit from meal coverage if trends increase after PO intake seen yesterday.  Thanks,  Christena Deem RN, MSN, BC-ADM Inpatient Diabetes Coordinator Team Pager [phone] (8a-5p)

## 2021-06-02 NOTE — Assessment & Plan Note (Signed)
Not on any statin.

## 2021-06-02 NOTE — Assessment & Plan Note (Addendum)
Replaced. ?Monitor. ?

## 2021-06-02 NOTE — Progress Notes (Signed)
PHARMACIST-PROVIDER COMMUNICATION  Andrew Duncan is admitted for acute cholecystitis who underwent cholecystostomy drain placement on 06/02/21.  Pharmacy is consulted to resume anticoagulants/antiplatelets post-op.  Pre-op anticoagulation/antiplatelet: enoxaparin 40mg  [name] daily (last dose 06/01/21)  Bleeding risk associated with procedure: standard - Anticoagulant to be resumed AM after surgery  Post-op anticoagulation/antiplatelet: enoxaparin 40mg  [name] q24 hours starting 12/3 AM  , PharmD 06/02/2021 2:11 PM

## 2021-06-02 NOTE — Progress Notes (Signed)
Patient ID: Andrew Duncan, male   DOB: [birth year], 75 y.o.   MRN: [id number] [facility] Surgery Progress Note     Subjective: CC-  On Bipap over night, 2L [place] this AM. Still tachypneic. Patient reports persistent RUQ pain, states that it is slightly improved since admission. Denies n/v.  WBC up 13.6, transaminases downtrending AST 76 ALT 250 He is drinking contrast for CT scan  Objective: Vital signs in last 24 hours: Temp:  [97.7 F (36.5 C)-98.9 F (37.2 C)] 98.9 F (37.2 C) (12/02 0359) Pulse Rate:  [91-120] 99 (12/02 0359) Resp:  [15-40] 30 (12/02 0806) BP: (136-190)/(67-89) 151/81 (12/02 0359) SpO2:  [95 %-100 %] 98 % (12/02 0359) Last BM Date: 05/31/21  Intake/Output from previous day: 12/01 0701 - 12/02 0700 In: 700 [P.O.:600; IV Piggyback:100] Out: 675 [Urine:675] Intake/Output this shift: Total I/O In: -  Out: 100 [Urine:100]  PE: Gen:  Alert, NAD, pleasant HEENT: EOM's intact, pupils equal and round Card:  tachy Pulm:  increased work of breathing on 2L [place]: Soft, mild distension, moderate TTP RUQ, no diffuse tenderness or peritonitis  Psych: A&Ox3 Skin: no rashes noted, warm and dry  Lab Results:  Recent Labs    06/01/21 0349 06/02/21 0420  WBC 12.0* 13.6*  HGB 11.8* 12.0*  HCT 35.7* 35.7*  PLT 274 299   BMET Recent Labs    06/01/21 0349 06/02/21 0420  NA 133* 137  K 3.4* 3.5  CL 104 107  CO2 24 23  GLUCOSE 273* 131*  BUN 24* 25*  CREATININE 0.97 1.02  CALCIUM 8.4* 8.5*   PT/INR Recent Labs    06/02/21 0833  LABPROT 13.0  INR 1.0   CMP     Component Value Date/Time   NA 137 06/02/2021 0420   K 3.5 06/02/2021 0420   CL 107 06/02/2021 0420   CO2 23 06/02/2021 0420   GLUCOSE 131 (H) 06/02/2021 0420   BUN 25 (H) 06/02/2021 0420   CREATININE 1.02 06/02/2021 0420   CALCIUM 8.5 (L) 06/02/2021 0420   PROT 6.4 (L) 06/02/2021 0420   ALBUMIN 2.8 (L) 06/02/2021 0420   AST 76 (H) 06/02/2021 0420   ALT 250 (H) 06/02/2021 0420    ALKPHOS 200 (H) 06/02/2021 0420   BILITOT 1.0 06/02/2021 0420   GFRNONAA >60 06/02/2021 0420   Lipase     Component Value Date/Time   LIPASE 23 05/27/2021 1231       Studies/Results: CT Angio Chest Pulmonary Embolism (PE) W or WO Contrast  Addendum Date: 06/01/2021   ADDENDUM REPORT: 06/01/2021 17:47 ADDENDUM: These results were called by telephone at the time of interpretation on 06/01/2021 at 5:47 pm to provider [facility] PATEL , who verbally acknowledged these results. Electronically Signed   By: Richarda Overlie M.D.   On: 06/01/2021 17:47   Result Date: 06/01/2021 CLINICAL DATA:  Pulmonary embolism suspected, high probability. Elevated D-dimer. EXAM: CT ANGIOGRAPHY CHEST WITH CONTRAST TECHNIQUE: Multidetector CT imaging of the chest was performed using the standard protocol during bolus administration of intravenous contrast. Multiplanar CT image reconstructions and MIPs were obtained to evaluate the vascular anatomy. CONTRAST:  47mL OMNIPAQUE IOHEXOL 350 MG/ML SOLN COMPARISON:  CT abdomen pelvis 05/27/2021 and ultrasound 05/31/2021 FINDINGS: Cardiovascular: Suboptimal evaluation for pulmonary emboli due to the timing of the exam. Large amount of the contrast is already in the thoracic aorta. However, the main pulmonary arteries are patent. Cannot evaluate the lobar and more distal pulmonary artery branches. Normal caliber of the thoracic  aorta. Heart size is normal without significant pericardial effusion. Mediastinum/Nodes: No significant lymph node enlargement in the mediastinum. No axillary lymph node enlargement. Lungs/Pleura: Compared to 05/27/2021, there is marked volume loss and consolidation in the right lower chest with a small amount of loculated right pleural fluid. Minimal aeration in the right lower lobe or right middle lobe. Significant motion artifact in the lungs. No significant airspace disease or consolidation in left lung. No significant left pleural fluid. Upper Abdomen:  Gallbladder appears to be distended with wall irregularity. This represents a change from the previous CT. Small amount of high-density material in the gallbladder could represent sludge or small stones. In addition, there is small amount of perihepatic ascites which may be within the subcapsular space. This perihepatic fluid is new from the previous CT but there was fluid in this area on the recent ultrasound. The entire abdomen is not imaged. No significant gastric distension. Musculoskeletal: No acute bone abnormality. Review of the MIP images confirms the above findings. IMPRESSION: 1. Volume loss and consolidation in right lower lobe and right middle lobe. Small loculated right pleural effusion. Findings could be secondary to pneumonia. 2. Limited evaluation for pulmonary embolism due to motion artifact and poor opacification of the pulmonary arteries. However, the main pulmonary arteries are patent and no evidence for a large central pulmonary embolism. 3. Abnormal appearance of the gallbladder with irregular fluid collections along the inferior aspect of the liver. Significant change in the gallbladder since 05/27/2021 and findings are concerning for acute cholecystitis. Abdominal findings could be better characterized with a dedicated CT of the abdomen and pelvis with IV contrast. Electronically Signed: By: Richarda Overlie M.D. On: 06/01/2021 17:37   VAS [place] LOWER EXTREMITY VENOUS (DVT)  Result Date: 06/01/2021  Lower Venous DVT Study Patient Name:  Andrew Duncan  Date of Exam:   06/01/2021 Medical Rec #: [id number]       Accession #:    [id number] Date of Birth: 1946-05-30       Patient Gender: M Patient Age:   81 years Exam Location:  [facility] Procedure:      VAS [place] LOWER EXTREMITY VENOUS (DVT) Referring Phys: PRANAV PATEL --------------------------------------------------------------------------------  Indications: Edema.  Risk Factors: None identified. Comparison Study: No prior studies.  Performing Technologist: Chanda Busing RVT  Examination Guidelines: A complete evaluation includes B-mode imaging, spectral Doppler, color Doppler, and power Doppler as needed of all accessible portions of each vessel. Bilateral testing is considered an integral part of a complete examination. Limited examinations for reoccurring indications may be performed as noted. The reflux portion of the exam is performed with the patient in reverse Trendelenburg.  +---------+---------------+---------+-----------+----------+--------------+ RIGHT    CompressibilityPhasicitySpontaneityPropertiesThrombus Aging +---------+---------------+---------+-----------+----------+--------------+ CFV      Full           Yes      Yes                                 +---------+---------------+---------+-----------+----------+--------------+ SFJ      Full                                                        +---------+---------------+---------+-----------+----------+--------------+ FV Prox  Full                                                        +---------+---------------+---------+-----------+----------+--------------+  FV Mid   Full                                                        +---------+---------------+---------+-----------+----------+--------------+ FV DistalFull                                                        +---------+---------------+---------+-----------+----------+--------------+ PFV      Full                                                        +---------+---------------+---------+-----------+----------+--------------+ POP      Full           Yes      Yes                                 +---------+---------------+---------+-----------+----------+--------------+ PTV      Full                                                        +---------+---------------+---------+-----------+----------+--------------+ PERO     Full                                                         +---------+---------------+---------+-----------+----------+--------------+   +---------+---------------+---------+-----------+----------+--------------+ LEFT     CompressibilityPhasicitySpontaneityPropertiesThrombus Aging +---------+---------------+---------+-----------+----------+--------------+ CFV      Full           Yes      Yes                                 +---------+---------------+---------+-----------+----------+--------------+ SFJ      Full                                                        +---------+---------------+---------+-----------+----------+--------------+ FV Prox  Full                                                        +---------+---------------+---------+-----------+----------+--------------+ FV Mid   Full                                                        +---------+---------------+---------+-----------+----------+--------------+  FV DistalFull                                                        +---------+---------------+---------+-----------+----------+--------------+ PFV      Full                                                        +---------+---------------+---------+-----------+----------+--------------+ POP      Full           Yes      Yes                                 +---------+---------------+---------+-----------+----------+--------------+ PTV      Full                                                        +---------+---------------+---------+-----------+----------+--------------+ PERO     Full                                                        +---------+---------------+---------+-----------+----------+--------------+     Summary: RIGHT: - There is no evidence of deep vein thrombosis in the lower extremity.  - No cystic structure found in the popliteal fossa.  LEFT: - There is no evidence of deep vein thrombosis in the lower extremity.  - No cystic structure  found in the popliteal fossa.  *See table(s) above for measurements and observations. Electronically signed by Sherald Hess MD on 06/01/2021 at 5:30:44 PM.    Final     Anti-infectives: Anti-infectives (From admission, onward)    Start     Dose/Rate Route Frequency Ordered Stop   06/01/21 2200  azithromycin (ZITHROMAX) tablet 500 mg        500 mg Oral Daily at bedtime 06/01/21 1111     06/01/21 1800  piperacillin-tazobactam (ZOSYN) IVPB 3.375 g        3.375 g 12.5 mL/hr over 240 Minutes Intravenous Every 8 hours 06/01/21 1749     05/31/21 2215  cefTRIAXone (ROCEPHIN) 2 g in sodium chloride 0.9 % 100 mL IVPB  Status:  Discontinued        2 g 200 mL/hr over 30 Minutes Intravenous Every 24 hours 05/31/21 0322 06/01/21 1742   05/30/21 2215  cefTRIAXone (ROCEPHIN) 1 g in sodium chloride 0.9 % 100 mL IVPB  Status:  Discontinued        1 g 200 mL/hr over 30 Minutes Intravenous Every 24 hours 05/30/21 2202 05/31/21 0322   05/30/21 2215  azithromycin (ZITHROMAX) 500 mg in sodium chloride 0.9 % 250 mL IVPB  Status:  Discontinued        500 mg 250 mL/hr over 60 Minutes Intravenous Every 24 hours 05/30/21 2202 06/01/21 1111        Assessment/Plan Acute  cholecystitis - gallbladder incompletely imaged on CTA but appears inflamed and possibly perforated. Transaminaes are acutely elevated. Primary team has ordered a CT scan for further evaluation - Given respiratory distress he is not a good candidate for general anesthesia and abdominal surgery at this time. Will ask IR to evaluate for percutaneous cholecystostomy tube placement. Continue IV antibiotics.   ID - zosyn/ azithromycin 11/29>> FEN - IVF, NPO VTE - SCDs, lovenox post-procedure Foley - none  Respiratory distress, Pneumonia HTN HLD DM   LOS: 2 days    Franne Forts, [facility] Surgery 06/02/2021, 8:56 AM Please see Amion for pager number during day hours 7:00am-4:30pm

## 2021-06-03 DIAGNOSIS — A419 Sepsis, unspecified organism: Secondary | ICD-10-CM | POA: Diagnosis not present

## 2021-06-03 DIAGNOSIS — J189 Pneumonia, unspecified organism: Secondary | ICD-10-CM | POA: Diagnosis not present

## 2021-06-03 LAB — COMPREHENSIVE METABOLIC PANEL
ALT: 254 U/L — ABNORMAL HIGH (ref 0–44)
AST: 122 U/L — ABNORMAL HIGH (ref 15–41)
Albumin: 2.5 g/dL — ABNORMAL LOW (ref 3.5–5.0)
Alkaline Phosphatase: 231 U/L — ABNORMAL HIGH (ref 38–126)
Anion gap: 8 (ref 5–15)
BUN: 21 mg/dL (ref 8–23)
CO2: 25 mmol/L (ref 22–32)
Calcium: 8.1 mg/dL — ABNORMAL LOW (ref 8.9–10.3)
Chloride: 106 mmol/L (ref 98–111)
Creatinine, Ser: 0.94 mg/dL (ref 0.61–1.24)
GFR, Estimated: >60 mL/min (ref >60)
Glucose, Bld: 132 mg/dL — ABNORMAL HIGH (ref 70–99)
Potassium: 3.4 mmol/L — ABNORMAL LOW (ref 3.5–5.1)
Sodium: 139 mmol/L (ref 135–145)
Total Bilirubin: 0.8 mg/dL (ref 0.3–1.2)
Total Protein: 6.2 g/dL — ABNORMAL LOW (ref 6.5–8.1)

## 2021-06-03 LAB — CBC WITH DIFFERENTIAL/PLATELET
Abs Immature Granulocytes: 0.54 10*3/uL — ABNORMAL HIGH (ref 0.00–0.07)
Basophils Absolute: 0.1 10*3/uL (ref 0.0–0.1)
Basophils Relative: 1 %
Eosinophils Absolute: 0.2 10*3/uL (ref 0.0–0.5)
Eosinophils Relative: 2 %
HCT: 34.9 % — ABNORMAL LOW (ref 39.0–52.0)
Hemoglobin: 11.6 g/dL — ABNORMAL LOW (ref 13.0–17.0)
Immature Granulocytes: 5 %
Lymphocytes Relative: 8 %
Lymphs Abs: 0.9 10*3/uL (ref 0.7–4.0)
MCH: 30.4 pg (ref 26.0–34.0)
MCHC: 33.2 g/dL (ref 30.0–36.0)
MCV: 91.6 fL (ref 80.0–100.0)
Monocytes Absolute: 1.9 10*3/uL — ABNORMAL HIGH (ref 0.1–1.0)
Monocytes Relative: 17 %
Neutro Abs: 7.3 10*3/uL (ref 1.7–7.7)
Neutrophils Relative %: 67 %
Platelets: 324 10*3/uL (ref 150–400)
RBC: 3.81 MIL/uL — ABNORMAL LOW (ref 4.22–5.81)
RDW: 13.9 % (ref 11.5–15.5)
WBC: 10.8 10*3/uL — ABNORMAL HIGH (ref 4.0–10.5)
nRBC: 0.2 % (ref 0.0–0.2)

## 2021-06-03 LAB — GLUCOSE, CAPILLARY
Glucose-Capillary: 122 mg/dL — ABNORMAL HIGH (ref 70–99)
Glucose-Capillary: 97 mg/dL (ref 70–99)
Glucose-Capillary: 232 mg/dL — ABNORMAL HIGH (ref 70–99)
Glucose-Capillary: 171 mg/dL — ABNORMAL HIGH (ref 70–99)
Glucose-Capillary: 223 mg/dL — ABNORMAL HIGH (ref 70–99)

## 2021-06-03 LAB — MAGNESIUM: Magnesium: 2.4 mg/dL (ref 1.7–2.4)

## 2021-06-03 MED ORDER — INSULIN GLARGINE-YFGN 100 UNIT/ML ~~LOC~~ SOLN
35.0000 [IU] | Freq: Every day | SUBCUTANEOUS | Status: DC
  Administered 2021-06-03 – 2021-06-04 (×2): 35 [IU] via SUBCUTANEOUS
  Filled 2021-06-03 (×3): qty 0.35

## 2021-06-03 MED ORDER — INSULIN ASPART 100 UNIT/ML IJ SOLN
0.0000 [IU] | Freq: Every day | INTRAMUSCULAR | Status: DC
  Administered 2021-06-03 – 2021-06-04 (×2): 2 [IU] via SUBCUTANEOUS

## 2021-06-03 MED ORDER — POTASSIUM CHLORIDE CRYS ER 20 MEQ PO TBCR
40.0000 meq | EXTENDED_RELEASE_TABLET | Freq: Once | ORAL | Status: AC
  Administered 2021-06-03: 40 meq via ORAL
  Filled 2021-06-03: qty 2

## 2021-06-03 MED ORDER — INSULIN ASPART 100 UNIT/ML IJ SOLN
0.0000 [IU] | Freq: Three times a day (TID) | INTRAMUSCULAR | Status: DC
  Administered 2021-06-03 – 2021-06-04 (×2): 3 [IU] via SUBCUTANEOUS
  Administered 2021-06-04: 12:00:00 5 [IU] via SUBCUTANEOUS
  Administered 2021-06-04: 09:00:00 2 [IU] via SUBCUTANEOUS
  Administered 2021-06-05: 5 [IU] via SUBCUTANEOUS

## 2021-06-03 NOTE — Progress Notes (Signed)
[facility] Progress Note  Patient: Andrew Duncan    KPU:[id number]  DOA: 05/30/2021    Date of Service: the patient was seen and examined on 06/03/2021  Brief hospital course: 11/29 admitted with shortness of breath found to have sepsis due to pneumonia. 12/1 cultures so far negative.  LFTs worsening.  GI consulted and signed off.  CT PE protocol ordered. 12/2 underwent IR.  Drain placement  Assessment and Plan: * Sepsis due to pneumonia (HCC) Met SIRS criteria on admission with tachycardia tachypnea and fever, leukocytosis.   X-ray showed mild pneumonia on the right lower lung zone.  CT shows dense consolidation on the right.  Also small pleural effusion. Blood cultures so far negative.   Continue with IV antibiotics.  Initially was on 1 g ceftriaxone, increase to 2 g ceftriaxone now on IV Zosyn.  Acute respiratory failure with hypoxia (HCC) Presents with pneumonia.  Her respiratory status progressively worsening despite aggressive measures with antibiotics and pulmonary toilet. Required BiPAP therapy due to severe respiratory distress. Continue as needed for now. Received 1 dose of IV Lasix. Continue incentive spirometry.   Acute cholecystitis Patient is having ongoing abdominal pain.  Seen in ER on 11/26. CT abdomen negative for any acute abnormality had some cholelithiasis.  On presentation with right upper quadrant pain ultrasound abdomen negative but had a pneumonia on the same side. CT chest performed for pneumonia and respiratory distress showed gallbladder wall thickening with concern for cholecystitis as well as microperforation. Repeat CT abdomen on 12/2 shows evidence of liver abscess, microperforation with cholecystitis. General surgery and IR consulted, antibiotics changed to IV Zosyn.  Not a candidate for cholecystectomy right now due to respiratory distress and pneumonia. Patient underwent IR guided PERC cholecystostomy drain placement. Pain improved.  Follow-up  on cultures. Patient will require cholangiogram at the time of drain removal or intraoperative.  Essential hypertension Blood pressure elevated also has tachycardia. Will add Lopressor.  Transaminitis LFTs trending upward from normal on 11/26 to in 300s and 12/1. Now trending down after drain placement. GI consulted appreciate assistance.  Currently signed off. Ultrasound abdomen negative for any acute abnormality.  Shows small volume ascites. avoiding hepatotoxic medication.  Hyponatremia Resolved.  Monitor.  Hypokalemia Replaced.  Monitor.  Pedal edema Lower extremity Doppler negative for DVT. Suspect this is volume overload.  Type 2 diabetes mellitus without complication, with long-term current use of insulin (HCC) Hemoglobin A1c 8.6 Uncontrolled with hyperglycemia Continue sliding scale insulin. Continue Lantus although on lower side secondary to poor p.o. intake.  35 units.  Hyperlipidemia Not on any statin.    Body mass index is 29.67 kg/m.        Subjective: Continues to have abdominal pain as well as shortness of breath although improving.  Passing gas.  No nausea no vomiting.  No fever no chills.  Objective: Vital signs were reviewed and unremarkable.  Exam: General: Appear in mild distress, no Rash; Oral Mucosa Clear, moist. no Abnormal Neck Mass Or lumps, Conjunctiva normal  Cardiovascular: S1 and S2 Present, no Murmur, Respiratory: increased respiratory effort, Bilateral Air entry present and bilateral  Crackles, no wheezes Abdomen: Bowel Sound present, Soft and right upper quadrant tenderness Extremities: trace Pedal edema Neurology: alert and oriented to time, place, and person affect appropriate. no new focal deficit Gait not checked due to patient safety concerns    Data Reviewed: My review of labs, imaging, notes and other tests is significant for     elevated LFT but improving.  Disposition:  Status is: Inpatient  Remains inpatient  appropriate because: Need IV antibiotic, need to monitor cultures, ongoing respiratory distress requiring BiPAP therapy.  Family Communication: Wife at bedside  DVT Prophylaxis: enoxaparin (LOVENOX) injection 40 mg Start: 06/03/21 1000   Time spent: 35 minutes.   Author: Berle Mull  06/03/2021 4:43 PM  To reach On-call, see care teams to locate the attending and reach out via [email or web address]. Between 7PM-7AM, please contact night-coverage If you still have difficulty reaching the attending provider, please page the [facility] (Director on Call) for [facility] on amion for assistance.

## 2021-06-03 NOTE — Progress Notes (Addendum)
Patient ID: Andrew Duncan, male   DOB: [birth year], 75 y.o.   MRN: [id number] [facility] Surgery Progress Note     Subjective: CC-  S/p IR drain, abd pain better  Objective: Vital signs in last 24 hours: Temp:  [97.9 F (36.6 C)-98.8 F (37.1 C)] 98.7 F (37.1 C) (12/03 0442) Pulse Rate:  [88-113] 91 (12/03 0442) Resp:  [18-40] 20 (12/03 0442) BP: (134-174)/(74-92) 148/74 (12/03 0442) SpO2:  [96 %-100 %] 100 % (12/03 0442) FiO2 (%):  [30 %] 30 % (12/02 2225) Last BM Date: 05/31/21  Intake/Output from previous day: 12/02 0701 - 12/03 0700 In: 1100 [P.O.:1000; IV Piggyback:100] Out: 970 [Urine:925; Drains:45] Intake/Output this shift: No intake/output data recorded.  PE: Gen:  Alert, NAD, pleasant Abd: Soft, mild distension, moderate TTP RUQ, no diffuse tenderness or peritonitis  Psych: A&Ox3 Skin: no rashes noted, warm and dry  Lab Results:  Recent Labs    06/02/21 0420 06/03/21 0454  WBC 13.6* 10.8*  HGB 12.0* 11.6*  HCT 35.7* 34.9*  PLT 299 324    BMET Recent Labs    06/02/21 0420 06/03/21 0454  NA 137 139  K 3.5 3.4*  CL 107 106  CO2 23 25  GLUCOSE 131* 132*  BUN 25* 21  CREATININE 1.02 0.94  CALCIUM 8.5* 8.1*    PT/INR Recent Labs    06/02/21 0833  LABPROT 13.0  INR 1.0    CMP     Component Value Date/Time   NA 139 06/03/2021 0454   K 3.4 (L) 06/03/2021 0454   CL 106 06/03/2021 0454   CO2 25 06/03/2021 0454   GLUCOSE 132 (H) 06/03/2021 0454   BUN 21 06/03/2021 0454   CREATININE 0.94 06/03/2021 0454   CALCIUM 8.1 (L) 06/03/2021 0454   PROT 6.2 (L) 06/03/2021 0454   ALBUMIN 2.5 (L) 06/03/2021 0454   AST 122 (H) 06/03/2021 0454   ALT 254 (H) 06/03/2021 0454   ALKPHOS 231 (H) 06/03/2021 0454   BILITOT 0.8 06/03/2021 0454   GFRNONAA >60 06/03/2021 0454   Lipase     Component Value Date/Time   LIPASE 23 05/27/2021 1231       Studies/Results: CT Angio Chest Pulmonary Embolism (PE) W or WO Contrast  Addendum Date:  06/01/2021   ADDENDUM REPORT: 06/01/2021 17:47 ADDENDUM: These results were called by telephone at the time of interpretation on 06/01/2021 at 5:47 pm to provider [facility] PATEL , who verbally acknowledged these results. Electronically Signed   By: Richarda Overlie M.D.   On: 06/01/2021 17:47   Result Date: 06/01/2021 CLINICAL DATA:  Pulmonary embolism suspected, high probability. Elevated D-dimer. EXAM: CT ANGIOGRAPHY CHEST WITH CONTRAST TECHNIQUE: Multidetector CT imaging of the chest was performed using the standard protocol during bolus administration of intravenous contrast. Multiplanar CT image reconstructions and MIPs were obtained to evaluate the vascular anatomy. CONTRAST:  80mL OMNIPAQUE IOHEXOL 350 MG/ML SOLN COMPARISON:  CT abdomen pelvis 05/27/2021 and ultrasound 05/31/2021 FINDINGS: Cardiovascular: Suboptimal evaluation for pulmonary emboli due to the timing of the exam. Large amount of the contrast is already in the thoracic aorta. However, the main pulmonary arteries are patent. Cannot evaluate the lobar and more distal pulmonary artery branches. Normal caliber of the thoracic aorta. Heart size is normal without significant pericardial effusion. Mediastinum/Nodes: No significant lymph node enlargement in the mediastinum. No axillary lymph node enlargement. Lungs/Pleura: Compared to 05/27/2021, there is marked volume loss and consolidation in the right lower chest with a small amount of loculated right  pleural fluid. Minimal aeration in the right lower lobe or right middle lobe. Significant motion artifact in the lungs. No significant airspace disease or consolidation in left lung. No significant left pleural fluid. Upper Abdomen: Gallbladder appears to be distended with wall irregularity. This represents a change from the previous CT. Small amount of high-density material in the gallbladder could represent sludge or small stones. In addition, there is small amount of perihepatic ascites which may be within  the subcapsular space. This perihepatic fluid is new from the previous CT but there was fluid in this area on the recent ultrasound. The entire abdomen is not imaged. No significant gastric distension. Musculoskeletal: No acute bone abnormality. Review of the MIP images confirms the above findings. IMPRESSION: 1. Volume loss and consolidation in right lower lobe and right middle lobe. Small loculated right pleural effusion. Findings could be secondary to pneumonia. 2. Limited evaluation for pulmonary embolism due to motion artifact and poor opacification of the pulmonary arteries. However, the main pulmonary arteries are patent and no evidence for a large central pulmonary embolism. 3. Abnormal appearance of the gallbladder with irregular fluid collections along the inferior aspect of the liver. Significant change in the gallbladder since 05/27/2021 and findings are concerning for acute cholecystitis. Abdominal findings could be better characterized with a dedicated CT of the abdomen and pelvis with IV contrast. Electronically Signed: By: Richarda Overlie M.D. On: 06/01/2021 17:37   CT ABDOMEN PELVIS W CONTRAST  Result Date: 06/02/2021 CLINICAL DATA:  Persistent right upper quadrant pain. Elevated white blood cell count. Evaluate gallbladder. EXAM: CT ABDOMEN AND PELVIS WITH CONTRAST TECHNIQUE: Multidetector CT imaging of the abdomen and pelvis was performed using the standard protocol following bolus administration of intravenous contrast. CONTRAST:  75mL OMNIPAQUE IOHEXOL 350 MG/ML SOLN COMPARISON:  CT abdomen pelvis 05/27/2021 FINDINGS: Lower chest: Partially visualized right pleural effusion with atelectatic collapse of the right lower lobe. Minimal dependent atelectasis in the left lung base. Hepatobiliary: The gallbladder remains abnormally distended measuring 6 cm transverse x 10.9 cm in length (series 2, image 25). The gallbladder wall is more ill-defined compared to prior exams with diffuse mild fat  stranding. A few subcentimeter hyperechoic foci the gallbladder neck likely correspond to the sludge seen on prior ultrasound. There is discontinuity of the superomedial gallbladder wall with hypodense fluid extending into the adjacent hepatic parenchyma (series 2, image 21; series 5, image 36). There are several subcapsular fluid collections within the inferior aspect of the right lobe of the liver, the largest measuring 4.8 x 1.4 cm (series 2, image 25). Pancreas: Diffuse moderate to severe parenchymal volume loss. The main pancreatic duct is not dilated. No peripancreatic fluid collection or fat stranding. Spleen: Normal in size without focal abnormality. Adrenals/Urinary Tract: Adrenal glands are unremarkable. Kidneys are normal, without renal calculi, focal lesion, or hydronephrosis. Bladder is minimally distended. Stomach/Bowel: Stomach is within normal limits. Appendix appears normal. Scattered diverticula throughout the colon. No evidence of bowel wall thickening, distention, or inflammatory changes. Vascular/Lymphatic: No significant vascular findings are present. No enlarged abdominal or pelvic lymph nodes. Reproductive: Enlarged prostate. Other: No abdominal wall hernia or abnormality. Musculoskeletal: No acute osseous finding. 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 IMPRESSION: 1. Cholecystitis. 2. Focal discontinuity of the superomedial gallbladder wall, suspicious for perforation into the adjacent hepatic parenchyma. 3. Several small subcapsular fluid collections in the inferior right liver, suspicious for liver abscesses secondary to cholecystitis. 4. Partially visualized right pleural effusion with atelectatic collapse of the  right lower lobe. Electronically Signed   By: Sherron Ales M.D.   On: 06/02/2021 14:17   IR Perc Cholecystostomy  Result Date: 06/02/2021 INDICATION: Acute cholecystitis, right upper quadrant pain and respiratory distress EXAM: Ultrasound for  ostomy guided cholecystostomy drain placement MEDICATIONS: Zosyn IV 3.375 g; The antibiotic was administered within an appropriate time frame prior to the initiation of the procedure. ANESTHESIA/SEDATION: None FLUOROSCOPY TIME:  Fluoroscopy Time: 0 minutes 36 seconds (11 mGy). COMPLICATIONS: None immediate. PROCEDURE: Informed written consent was obtained from the patient after a thorough discussion of the procedural risks, benefits and alternatives. All questions were addressed. Maximal Sterile Barrier Technique was utilized including caps, mask, sterile gowns, sterile gloves, sterile drape, hand hygiene and skin antiseptic. A timeout was performed prior to the initiation of the procedure. Patient positioned supine on the procedure table. The right upper quadrant skin prepped and draped in usual fashion. Following local lidocaine administration, 21 gauge needle was inserted into the gallbladder utilizing continuous ultrasound guidance. 21 gauge needle exchanged for a transitional dilator set over 0.018 inch guidewire. Transitional dilator set exchanged for 10.2 [place] multipurpose pigtail drain over 0.035 inch guidewire. 10 mL sample obtained from the gallbladder and sent for Gram stain and culture. Contrast administered through the drain showed appropriate positioning within the gallbladder lumen. Drain secured to skin with suture and connected to bag. IMPRESSION: 10.2 French cholecystostomy drain placed using ultrasound and fluoroscopy guidance. Electronically Signed   By: Acquanetta Belling M.D.   On: 06/02/2021 14:16   VAS [place] LOWER EXTREMITY VENOUS (DVT)  Result Date: 06/01/2021  Lower Venous DVT Study Patient Name:  PASQUALINO WITHERSPOON  Date of Exam:   06/01/2021 Medical Rec #: [id number]       Accession #:    [id number] Date of Birth: 1945/10/09       Patient Gender: M Patient Age:   41 years Exam Location:  [facility] Procedure:      VAS [place] LOWER EXTREMITY VENOUS (DVT) Referring Phys: PRANAV PATEL  --------------------------------------------------------------------------------  Indications: Edema.  Risk Factors: None identified. Comparison Study: No prior studies. Performing Technologist: Chanda Busing RVT  Examination Guidelines: A complete evaluation includes B-mode imaging, spectral Doppler, color Doppler, and power Doppler as needed of all accessible portions of each vessel. Bilateral testing is considered an integral part of a complete examination. Limited examinations for reoccurring indications may be performed as noted. The reflux portion of the exam is performed with the patient in reverse Trendelenburg.  +---------+---------------+---------+-----------+----------+--------------+ RIGHT    CompressibilityPhasicitySpontaneityPropertiesThrombus Aging +---------+---------------+---------+-----------+----------+--------------+ CFV      Full           Yes      Yes                                 +---------+---------------+---------+-----------+----------+--------------+ SFJ      Full                                                        +---------+---------------+---------+-----------+----------+--------------+ FV Prox  Full                                                        +---------+---------------+---------+-----------+----------+--------------+  FV Mid   Full                                                        +---------+---------------+---------+-----------+----------+--------------+ FV DistalFull                                                        +---------+---------------+---------+-----------+----------+--------------+ PFV      Full                                                        +---------+---------------+---------+-----------+----------+--------------+ POP      Full           Yes      Yes                                 +---------+---------------+---------+-----------+----------+--------------+ PTV      Full                                                         +---------+---------------+---------+-----------+----------+--------------+ PERO     Full                                                        +---------+---------------+---------+-----------+----------+--------------+   +---------+---------------+---------+-----------+----------+--------------+ LEFT     CompressibilityPhasicitySpontaneityPropertiesThrombus Aging +---------+---------------+---------+-----------+----------+--------------+ CFV      Full           Yes      Yes                                 +---------+---------------+---------+-----------+----------+--------------+ SFJ      Full                                                        +---------+---------------+---------+-----------+----------+--------------+ FV Prox  Full                                                        +---------+---------------+---------+-----------+----------+--------------+ FV Mid   Full                                                        +---------+---------------+---------+-----------+----------+--------------+  FV DistalFull                                                        +---------+---------------+---------+-----------+----------+--------------+ PFV      Full                                                        +---------+---------------+---------+-----------+----------+--------------+ POP      Full           Yes      Yes                                 +---------+---------------+---------+-----------+----------+--------------+ PTV      Full                                                        +---------+---------------+---------+-----------+----------+--------------+ PERO     Full                                                        +---------+---------------+---------+-----------+----------+--------------+     Summary: RIGHT: - There is no evidence of deep vein thrombosis in the  lower extremity.  - No cystic structure found in the popliteal fossa.  LEFT: - There is no evidence of deep vein thrombosis in the lower extremity.  - No cystic structure found in the popliteal fossa.  *See table(s) above for measurements and observations. Electronically signed by Sherald Hess MD on 06/01/2021 at 5:30:44 PM.    Final     Anti-infectives: Anti-infectives (From admission, onward)    Start     Dose/Rate Route Frequency Ordered Stop   06/01/21 2200  azithromycin (ZITHROMAX) tablet 500 mg        500 mg Oral Daily at bedtime 06/01/21 1111     06/01/21 1800  piperacillin-tazobactam (ZOSYN) IVPB 3.375 g        3.375 g 12.5 mL/hr over 240 Minutes Intravenous Every 8 hours 06/01/21 1749     05/31/21 2215  cefTRIAXone (ROCEPHIN) 2 g in sodium chloride 0.9 % 100 mL IVPB  Status:  Discontinued        2 g 200 mL/hr over 30 Minutes Intravenous Every 24 hours 05/31/21 0322 06/01/21 1742   05/30/21 2215  cefTRIAXone (ROCEPHIN) 1 g in sodium chloride 0.9 % 100 mL IVPB  Status:  Discontinued        1 g 200 mL/hr over 30 Minutes Intravenous Every 24 hours 05/30/21 2202 05/31/21 0322   05/30/21 2215  azithromycin (ZITHROMAX) 500 mg in sodium chloride 0.9 % 250 mL IVPB  Status:  Discontinued        500 mg 250 mL/hr over 60 Minutes Intravenous Every 24 hours 05/30/21 2202 06/01/21 1111        Assessment/Plan Acute  cholecystitis - IR cholecystostomy tube in place - Continue IV antibiotics.   ID - zosyn/ azithromycin 11/29>> FEN - IVF, Reg VTE - SCDs, lovenox post-procedure Foley - none  Respiratory distress, Pneumonia HTN HLD DM   LOS: 3 days    [facility] 06/03/2021, 10:09 AM Please see Amion for pager number during day hours 7:00am-4:30pm

## 2021-06-03 NOTE — Progress Notes (Signed)
Pt refused bipap for tonight.  He said he would like to stay with O2. Bipap stand by advised the let the RN know if he changes his mind. Pt is in no resp distress.

## 2021-06-04 DIAGNOSIS — A419 Sepsis, unspecified organism: Secondary | ICD-10-CM | POA: Diagnosis not present

## 2021-06-04 DIAGNOSIS — J189 Pneumonia, unspecified organism: Secondary | ICD-10-CM | POA: Diagnosis not present

## 2021-06-04 DIAGNOSIS — K750 Abscess of liver: Secondary | ICD-10-CM | POA: Clinically undetermined

## 2021-06-04 LAB — CBC WITH DIFFERENTIAL/PLATELET
Abs Immature Granulocytes: 0.4 10*3/uL — ABNORMAL HIGH (ref 0.00–0.07)
Basophils Absolute: 0 10*3/uL (ref 0.0–0.1)
Basophils Relative: 0 %
Eosinophils Absolute: 0.2 10*3/uL (ref 0.0–0.5)
Eosinophils Relative: 3 %
HCT: 36 % — ABNORMAL LOW (ref 39.0–52.0)
Hemoglobin: 12 g/dL — ABNORMAL LOW (ref 13.0–17.0)
Lymphocytes Relative: 23 %
Lymphs Abs: 1.8 10*3/uL (ref 0.7–4.0)
MCH: 30.8 pg (ref 26.0–34.0)
MCHC: 33.3 g/dL (ref 30.0–36.0)
MCV: 92.5 fL (ref 80.0–100.0)
Metamyelocytes Relative: 3 %
Monocytes Absolute: 1 10*3/uL (ref 0.1–1.0)
Monocytes Relative: 13 %
Myelocytes: 2 %
Neutro Abs: 4.3 10*3/uL (ref 1.7–7.7)
Neutrophils Relative %: 56 %
Platelet Morphology: NORMAL
Platelets: 381 10*3/uL (ref 150–400)
RBC: 3.89 MIL/uL — ABNORMAL LOW (ref 4.22–5.81)
RDW: 14.2 % (ref 11.5–15.5)
WBC: 7.7 10*3/uL (ref 4.0–10.5)
nRBC: 0 % (ref 0.0–0.2)

## 2021-06-04 LAB — COMPREHENSIVE METABOLIC PANEL
ALT: 213 U/L — ABNORMAL HIGH (ref 0–44)
AST: 79 U/L — ABNORMAL HIGH (ref 15–41)
Albumin: 2.7 g/dL — ABNORMAL LOW (ref 3.5–5.0)
Alkaline Phosphatase: 235 U/L — ABNORMAL HIGH (ref 38–126)
Anion gap: 6 (ref 5–15)
BUN: 15 mg/dL (ref 8–23)
CO2: 24 mmol/L (ref 22–32)
Calcium: 8.2 mg/dL — ABNORMAL LOW (ref 8.9–10.3)
Chloride: 106 mmol/L (ref 98–111)
Creatinine, Ser: 0.94 mg/dL (ref 0.61–1.24)
GFR, Estimated: >60 mL/min (ref >60)
Glucose, Bld: 154 mg/dL — ABNORMAL HIGH (ref 70–99)
Potassium: 3.6 mmol/L (ref 3.5–5.1)
Sodium: 136 mmol/L (ref 135–145)
Total Bilirubin: 0.7 mg/dL (ref 0.3–1.2)
Total Protein: 6.2 g/dL — ABNORMAL LOW (ref 6.5–8.1)

## 2021-06-04 LAB — GLUCOSE, CAPILLARY
Glucose-Capillary: 139 mg/dL — ABNORMAL HIGH (ref 70–99)
Glucose-Capillary: 218 mg/dL — ABNORMAL HIGH (ref 70–99)
Glucose-Capillary: 166 mg/dL — ABNORMAL HIGH (ref 70–99)
Glucose-Capillary: 212 mg/dL — ABNORMAL HIGH (ref 70–99)

## 2021-06-04 LAB — MAGNESIUM: Magnesium: 2.3 mg/dL (ref 1.7–2.4)

## 2021-06-04 MED ORDER — AZITHROMYCIN 250 MG PO TABS
500.0000 mg | ORAL_TABLET | Freq: Every day | ORAL | Status: AC
  Administered 2021-06-04 – 2021-06-05 (×2): 500 mg via ORAL
  Filled 2021-06-04 (×2): qty 2

## 2021-06-04 MED ORDER — AMOXICILLIN-POT CLAVULANATE 875-125 MG PO TABS
1.0000 | ORAL_TABLET | Freq: Two times a day (BID) | ORAL | Status: DC
  Administered 2021-06-04 – 2021-06-05 (×2): 1 via ORAL
  Filled 2021-06-04 (×2): qty 1

## 2021-06-04 NOTE — Progress Notes (Signed)
[facility] Progress Note     Subjective: CC-  Breathing better.  Abdominal pain improving.    Objective: Vital signs in last 24 hours: Temp:  [98.3 F (36.8 C)-99.1 F (37.3 C)] 98.3 F (36.8 C) (12/04 0528) Pulse Rate:  [89-101] 89 (12/04 0528) Resp:  [18-19] 19 (12/04 0528) BP: (142-163)/(69-80) 142/70 (12/04 0528) SpO2:  [95 %-98 %] 98 % (12/04 0808) Last BM Date: 06/04/21  Intake/Output from previous day: 12/03 0701 - 12/04 0700 In: 460 [P.O.:360; IV Piggyback:100] Out: 770 [Urine:650; Drains:120] Intake/Output this shift: No intake/output data recorded.  PE: Gen:  Alert, NAD, pleasant Abd: Soft, non distended, non tender.  Drain with slightly murky blood tinged bile.   Psych: A&Ox3 Skin: no rashes noted, warm and dry  Lab Results:  Recent Labs    06/02/21 0420 06/03/21 0454  WBC 13.6* 10.8*  HGB 12.0* 11.6*  HCT 35.7* 34.9*  PLT 299 324   BMET Recent Labs    06/02/21 0420 06/03/21 0454  NA 137 139  K 3.5 3.4*  CL 107 106  CO2 23 25  GLUCOSE 131* 132*  BUN 25* 21  CREATININE 1.02 0.94  CALCIUM 8.5* 8.1*   PT/INR Recent Labs    06/02/21 0833  LABPROT 13.0  INR 1.0   CMP     Component Value Date/Time   NA 139 06/03/2021 0454   K 3.4 (L) 06/03/2021 0454   CL 106 06/03/2021 0454   CO2 25 06/03/2021 0454   GLUCOSE 132 (H) 06/03/2021 0454   BUN 21 06/03/2021 0454   CREATININE 0.94 06/03/2021 0454   CALCIUM 8.1 (L) 06/03/2021 0454   PROT 6.2 (L) 06/03/2021 0454   ALBUMIN 2.5 (L) 06/03/2021 0454   AST 122 (H) 06/03/2021 0454   ALT 254 (H) 06/03/2021 0454   ALKPHOS 231 (H) 06/03/2021 0454   BILITOT 0.8 06/03/2021 0454   GFRNONAA >60 06/03/2021 0454   Lipase     Component Value Date/Time   LIPASE 23 05/27/2021 1231       Studies/Results: CT ABDOMEN PELVIS W CONTRAST  Result Date: 06/02/2021 CLINICAL DATA:  Persistent right upper quadrant pain. Elevated white blood cell count. Evaluate gallbladder. EXAM: CT ABDOMEN  AND PELVIS WITH CONTRAST TECHNIQUE: Multidetector CT imaging of the abdomen and pelvis was performed using the standard protocol following bolus administration of intravenous contrast. CONTRAST:  78mL OMNIPAQUE IOHEXOL 350 MG/ML SOLN COMPARISON:  CT abdomen pelvis 05/27/2021 FINDINGS: Lower chest: Partially visualized right pleural effusion with atelectatic collapse of the right lower lobe. Minimal dependent atelectasis in the left lung base. Hepatobiliary: The gallbladder remains abnormally distended measuring 6 cm transverse x 10.9 cm in length (series 2, image 25). The gallbladder wall is more ill-defined compared to prior exams with diffuse mild fat stranding. A few subcentimeter hyperechoic foci the gallbladder neck likely correspond to the sludge seen on prior ultrasound. There is discontinuity of the superomedial gallbladder wall with hypodense fluid extending into the adjacent hepatic parenchyma (series 2, image 21; series 5, image 36). There are several subcapsular fluid collections within the inferior aspect of the right lobe of the liver, the largest measuring 4.8 x 1.4 cm (series 2, image 25). Pancreas: Diffuse moderate to severe parenchymal volume loss. The main pancreatic duct is not dilated. No peripancreatic fluid collection or fat stranding. Spleen: Normal in size without focal abnormality. Adrenals/Urinary Tract: Adrenal glands are unremarkable. Kidneys are normal, without renal calculi, focal lesion, or hydronephrosis. Bladder is minimally distended. Stomach/Bowel: Stomach is within  normal limits. Appendix appears normal. Scattered diverticula throughout the colon. No evidence of bowel wall thickening, distention, or inflammatory changes. Vascular/Lymphatic: No significant vascular findings are present. No enlarged abdominal or pelvic lymph nodes. Reproductive: Enlarged prostate. Other: No abdominal wall hernia or abnormality. Musculoskeletal: No acute osseous finding. Severe degenerative disc  disease at L5-S1 with vacuum disc phenomenon. IMPRESSION: 1. Cholecystitis. 2. Focal discontinuity of the superomedial gallbladder wall, suspicious for perforation into the adjacent hepatic parenchyma. 3. Several small subcapsular fluid collections in the inferior right liver, suspicious for liver abscesses secondary to cholecystitis. 4. Partially visualized right pleural effusion with atelectatic collapse of the right lower lobe. Electronically Signed   By: Sherron Ales M.D.   On: 06/02/2021 14:17   IR Perc Cholecystostomy  Result Date: 06/02/2021 INDICATION: Acute cholecystitis, right upper quadrant pain and respiratory distress EXAM: Ultrasound for ostomy guided cholecystostomy drain placement MEDICATIONS: Zosyn IV 3.375 g; The antibiotic was administered within an appropriate time frame prior to the initiation of the procedure. ANESTHESIA/SEDATION: None FLUOROSCOPY TIME:  Fluoroscopy Time: 0 minutes 36 seconds (11 mGy). COMPLICATIONS: None immediate. PROCEDURE: Informed written consent was obtained from the patient after a thorough discussion of the procedural risks, benefits and alternatives. All questions were addressed. Maximal Sterile Barrier Technique was utilized including caps, mask, sterile gowns, sterile gloves, sterile drape, hand hygiene and skin antiseptic. A timeout was performed prior to the initiation of the procedure. Patient positioned supine on the procedure table. The right upper quadrant skin prepped and draped in usual fashion. Following local lidocaine administration, 21 gauge needle was inserted into the gallbladder utilizing continuous ultrasound guidance. 21 gauge needle exchanged for a transitional dilator set over 0.018 inch guidewire. Transitional dilator set exchanged for 10.2 [place] multipurpose pigtail drain over 0.035 inch guidewire. 10 mL sample obtained from the gallbladder and sent for Gram stain and culture. Contrast administered through the drain showed appropriate  positioning within the gallbladder lumen. Drain secured to skin with suture and connected to bag. IMPRESSION: 10.2 French cholecystostomy drain placed using ultrasound and fluoroscopy guidance. Electronically Signed   By: Acquanetta Belling M.D.   On: 06/02/2021 14:16    Anti-infectives: Anti-infectives (From admission, onward)    Start     Dose/Rate Route Frequency Ordered Stop   06/01/21 2200  azithromycin (ZITHROMAX) tablet 500 mg        500 mg Oral Daily at bedtime 06/01/21 1111     06/01/21 1800  piperacillin-tazobactam (ZOSYN) IVPB 3.375 g        3.375 g 12.5 mL/hr over 240 Minutes Intravenous Every 8 hours 06/01/21 1749     05/31/21 2215  cefTRIAXone (ROCEPHIN) 2 g in sodium chloride 0.9 % 100 mL IVPB  Status:  Discontinued        2 g 200 mL/hr over 30 Minutes Intravenous Every 24 hours 05/31/21 0322 06/01/21 1742   05/30/21 2215  cefTRIAXone (ROCEPHIN) 1 g in sodium chloride 0.9 % 100 mL IVPB  Status:  Discontinued        1 g 200 mL/hr over 30 Minutes Intravenous Every 24 hours 05/30/21 2202 05/31/21 0322   05/30/21 2215  azithromycin (ZITHROMAX) 500 mg in sodium chloride 0.9 % 250 mL IVPB  Status:  Discontinued        500 mg 250 mL/hr over 60 Minutes Intravenous Every 24 hours 05/30/21 2202 06/01/21 1111        Assessment/Plan Acute cholecystitis - IR cholecystostomy tube in place Antibiotics per medicine.   OK for d/c  from gallbladder standpoint.  Discharge information including appointments and instructions in the chart.   Call for further questions.    ID - zosyn/ azithromycin 11/29>> FEN - IVF, Reg VTE - SCDs, lovenox post-procedure Foley - none  Respiratory distress, Pneumonia HTN HLD DM   LOS: 4 days    [facility] 06/04/2021, 9:05 AM Please see Amion for pager number during day hours 7:00am-4:30pm

## 2021-06-04 NOTE — Assessment & Plan Note (Signed)
Secondary to direct involvement with cholecystitis. Currently receiving IV Zosyn. Clinically improving therefore will transition to oral Augmentin for 2 more weeks. Last day of antibiotic 06/18/2021. Follow-up with general surgery for further imaging and if needed longer duration of the antibiotics.

## 2021-06-04 NOTE — Progress Notes (Signed)
Patient does not want to wear BIPAP tonight. Patient encouraged to let staff know if he changes his mind.

## 2021-06-04 NOTE — Progress Notes (Signed)
[facility] Progress Note  Patient: Andrew Duncan    YTK:[id number]  DOA: 05/30/2021    Date of Service: the patient was seen and examined on 06/04/2021  Brief hospital course: 11/29 admitted with shortness of breath found to have sepsis due to pneumonia. 12/1 cultures so far negative.  LFTs worsening.  GI consulted and signed off.  CT PE protocol ordered. 12/2 underwent IR.  Drain placement 12/4 okay to DC from gallbladder standpoint per GS.  Discontinue BiPAP.  Ambulated in hallway without oxygen.  Antibiotics switched to p.o.  Assessment and Plan: * Sepsis due to pneumonia ([place]) Met SIRS criteria on admission with tachycardia tachypnea and fever, leukocytosis.   X-ray showed mild pneumonia on the right lower lung zone.  CT shows dense consolidation on the right.  Also small pleural effusion. Blood cultures so far negative.   Initially was on 1 g ceftriaxone, increase to 2 g ceftriaxone now on IV Zosyn. Transition to oral Augmentin.  Continue azithromycin for 3 more days to cover atypical organism.  Liver abscess Secondary to direct involvement with cholecystitis. Currently receiving IV Zosyn. Clinically improving therefore will transition to oral Augmentin for 2 more weeks. Last day of antibiotic 06/18/2021. Follow-up with general surgery for further imaging and if needed longer duration of the antibiotics.  Acute respiratory failure with hypoxia (HCC) Presents with pneumonia.  Her respiratory status progressively worsening despite aggressive measures with antibiotics and pulmonary toilet. Required BiPAP therapy due to severe respiratory distress. Continue as needed for now. Received 1 dose of IV Lasix. Continue incentive spirometry.   Acute cholecystitis Patient is having ongoing abdominal pain.  Seen in ER on 11/26. CT abdomen negative for any acute abnormality had some cholelithiasis.  On presentation with right upper quadrant pain ultrasound abdomen negative but had a  pneumonia on the same side. CT chest performed for pneumonia and respiratory distress showed gallbladder wall thickening with concern for cholecystitis as well as microperforation. Repeat CT abdomen on 12/2 shows evidence of liver abscess, microperforation with cholecystitis. General surgery and IR consulted, antibiotics changed to IV Zosyn.  Not a candidate for cholecystectomy right now due to respiratory distress and pneumonia. Patient underwent IR guided PERC cholecystostomy drain placement. Pain improved.  Follow-up on cultures.  So far no growth.  Transition from IV Zosyn to Augmentin. Patient will require cholangiogram at the time of drain removal or intraoperative.  Essential hypertension Blood pressure elevated also has tachycardia. Currently on Lopressor and hydralazine.  We will continue.  Transaminitis LFTs trending upward from normal on 11/26 to in 300s and 12/1. Now trending down after drain placement. GI consulted appreciate assistance.  Currently signed off. Ultrasound abdomen negative for any acute abnormality.  Shows small volume ascites. avoiding hepatotoxic medication.  Hyponatremia Resolved.  Monitor.  Hypokalemia Replaced.  Monitor.  Pedal edema Lower extremity Doppler negative for DVT. Suspect this is volume overload.  Type 2 diabetes mellitus without complication, with long-term current use of insulin (HCC) Hemoglobin A1c 8.6 Uncontrolled with hyperglycemia Continue sliding scale insulin. Continue Lantus although on lower side secondary to poor p.o. intake.  35 units.  Hyperlipidemia Not on any statin.   Subjective: No nausea no vomiting.  No fever no chills.  Ambulated in the hallway without any oxygen.  No chest pain.  Abdominal pain resolved as well.  Passing gas having bowel movement.  Oral intake adequate.  Objective: Vital signs were reviewed and unremarkable.  Exam: General: Appear in mild distress, no Rash; Oral Mucosa Clear, moist.  no  Abnormal Neck Mass Or lumps, Conjunctiva normal  Cardiovascular: S1 and S2 Present, no Murmur, Respiratory: Improving respiratory effort, Bilateral Air entry present and bilateral  Crackles, no wheezes Abdomen: Bowel Sound present, Soft and no tenderness Extremities: trace Pedal edema Neurology: alert and oriented to time, place, and person affect appropriate. no new focal deficit Gait not checked due to patient safety concerns     Data Reviewed: My review of labs, imaging, notes and other tests is significant for     improving LFT, normalizing WBC.  Disposition:  Status is: Inpatient  Remains inpatient appropriate because: Antibiotics changed to p.o.  Need to monitor for improvement in respiratory status   Family Communication: None at bedside.  Discussed with wife on 12/3.  DVT Prophylaxis: enoxaparin (LOVENOX) injection 40 mg Start: 06/03/21 1000   Time spent: 35 minutes.   Author: Berle Mull  06/04/2021 3:41 PM  To reach On-call, see care teams to locate the attending and reach out via [email or web address]. Between 7PM-7AM, please contact night-coverage If you still have difficulty reaching the attending provider, please page the [facility] (Director on Call) for [facility] on amion for assistance.

## 2021-06-05 ENCOUNTER — Other Ambulatory Visit (HOSPITAL_COMMUNITY): Payer: Self-pay

## 2021-06-05 DIAGNOSIS — A419 Sepsis, unspecified organism: Secondary | ICD-10-CM | POA: Diagnosis not present

## 2021-06-05 DIAGNOSIS — J189 Pneumonia, unspecified organism: Secondary | ICD-10-CM | POA: Diagnosis not present

## 2021-06-05 LAB — CULTURE, BLOOD (ROUTINE X 2)
Culture: NO GROWTH
Culture: NO GROWTH
Special Requests: ADEQUATE
Special Requests: ADEQUATE

## 2021-06-05 LAB — CBC WITH DIFFERENTIAL/PLATELET
Abs Immature Granulocytes: 0.53 10*3/uL — ABNORMAL HIGH (ref 0.00–0.07)
Basophils Absolute: 0.1 10*3/uL (ref 0.0–0.1)
Basophils Relative: 1 %
Eosinophils Absolute: 0.2 10*3/uL (ref 0.0–0.5)
Eosinophils Relative: 2 %
HCT: 33.3 % — ABNORMAL LOW (ref 39.0–52.0)
Hemoglobin: 11.1 g/dL — ABNORMAL LOW (ref 13.0–17.0)
Immature Granulocytes: 7 %
Lymphocytes Relative: 20 %
Lymphs Abs: 1.6 10*3/uL (ref 0.7–4.0)
MCH: 30.7 pg (ref 26.0–34.0)
MCHC: 33.3 g/dL (ref 30.0–36.0)
MCV: 92.2 fL (ref 80.0–100.0)
Monocytes Absolute: 0.9 10*3/uL (ref 0.1–1.0)
Monocytes Relative: 12 %
Neutro Abs: 4.5 10*3/uL (ref 1.7–7.7)
Neutrophils Relative %: 58 %
Platelets: 373 10*3/uL (ref 150–400)
RBC: 3.61 MIL/uL — ABNORMAL LOW (ref 4.22–5.81)
RDW: 14.2 % (ref 11.5–15.5)
WBC: 7.8 10*3/uL (ref 4.0–10.5)
nRBC: 0.3 % — ABNORMAL HIGH (ref 0.0–0.2)

## 2021-06-05 LAB — COMPREHENSIVE METABOLIC PANEL
ALT: 168 U/L — ABNORMAL HIGH (ref 0–44)
AST: 62 U/L — ABNORMAL HIGH (ref 15–41)
Albumin: 2.4 g/dL — ABNORMAL LOW (ref 3.5–5.0)
Alkaline Phosphatase: 204 U/L — ABNORMAL HIGH (ref 38–126)
Anion gap: 7 (ref 5–15)
BUN: 13 mg/dL (ref 8–23)
CO2: 23 mmol/L (ref 22–32)
Calcium: 8 mg/dL — ABNORMAL LOW (ref 8.9–10.3)
Chloride: 108 mmol/L (ref 98–111)
Creatinine, Ser: 0.82 mg/dL (ref 0.61–1.24)
GFR, Estimated: >60 mL/min (ref >60)
Glucose, Bld: 112 mg/dL — ABNORMAL HIGH (ref 70–99)
Potassium: 3.7 mmol/L (ref 3.5–5.1)
Sodium: 138 mmol/L (ref 135–145)
Total Bilirubin: 0.4 mg/dL (ref 0.3–1.2)
Total Protein: 5.6 g/dL — ABNORMAL LOW (ref 6.5–8.1)

## 2021-06-05 LAB — GLUCOSE, CAPILLARY
Glucose-Capillary: 68 mg/dL — ABNORMAL LOW (ref 70–99)
Glucose-Capillary: 76 mg/dL (ref 70–99)
Glucose-Capillary: 235 mg/dL — ABNORMAL HIGH (ref 70–99)

## 2021-06-05 LAB — MAGNESIUM: Magnesium: 2.2 mg/dL (ref 1.7–2.4)

## 2021-06-05 MED ORDER — SODIUM CHLORIDE FLUSH 0.9 % IV SOLN
3.0000 mL | INTRAVENOUS | 0 refills | Status: AC | PRN
Start: 2021-06-05 — End: 2021-07-05

## 2021-06-05 MED ORDER — METOPROLOL TARTRATE 25 MG PO TABS: 25.0000 mg | ORAL_TABLET | Freq: Two times a day (BID) | ORAL | 1 refills | Status: DC

## 2021-06-05 MED ORDER — "COMPDRESS ISLAND DRESSING 4""X4 PADS": 1.0000 | MEDICATED_PAD | Freq: Every day | 0 refills | Status: AC

## 2021-06-05 MED ORDER — NORMAL SALINE FLUSH 0.9 % IV SOLN
INTRAVENOUS | 0 refills | Status: DC
  Filled 2021-06-05: qty 300, 10d supply, fill #0

## 2021-06-05 MED ORDER — HYDRALAZINE HCL 25 MG PO TABS: 25.0000 mg | ORAL_TABLET | Freq: Three times a day (TID) | ORAL | 1 refills | Status: DC

## 2021-06-05 MED ORDER — AMOXICILLIN-POT CLAVULANATE 875-125 MG PO TABS: 1.0000 | ORAL_TABLET | Freq: Two times a day (BID) | ORAL | 0 refills | Status: AC

## 2021-06-05 NOTE — Discharge Instructions (Addendum)
-   Flush drain once daily with 5-10 cc NS flush (patient will need an order for flushes upon discharge). RN aware to teach patient how to manage drains at home. - Record output from drain once daily. - keep the site clean and dry. Ok to shower but site must be covered with an occlusive dressing.  - Follow-up with [facility] for routine drain exchange in 6-8 weeks. A scheduler from our office will call with a date/time of the appointment.  To make it easy Do drain care every day at same time.  Example 10am Empty drain Flush drain Record amount of drainage.  (Date and amount:) Output is dark black/brown may be lighter as days go on.   If you start to have fevers or have redness, puss or foul smelling discharge call the radiology department.

## 2021-06-05 NOTE — Progress Notes (Signed)
If patient is to be discharged, below are discharge instructions: - Flush drain once daily with 5-10 cc NS flush (patient will need an order for flushes upon discharge). RN aware to teach patient how to manage drains at home. - Record output from drain once daily. - keep the site clean and dry. Ok to shower but site must be covered with an occlusive dressing.  - Follow-up with [facility] for routine drain exchange in 6-8 weeks. A scheduler from our office will call with a date/time of the appointment.  Alwyn Ren, [place] [phone] 06/05/2021, 11:06 AM

## 2021-06-05 NOTE — Plan of Care (Signed)
  Problem: Education: Goal: Knowledge of General Education information will improve Description: Including pain rating scale, medication(s)/side effects and non-pharmacologic comfort measures 06/05/2021 1241 by Cheron Schaumann, RN Outcome: Adequate for Discharge 06/05/2021 1241 by Cheron Schaumann, RN Outcome: Adequate for Discharge   Problem: Health Behavior/Discharge Planning: Goal: Ability to manage health-related needs will improve 06/05/2021 1241 by Cheron Schaumann, RN Outcome: Adequate for Discharge 06/05/2021 1241 by Cheron Schaumann, RN Outcome: Adequate for Discharge   Problem: Clinical Measurements: Goal: Ability to maintain clinical measurements within normal limits will improve 06/05/2021 1241 by Cheron Schaumann, RN Outcome: Adequate for Discharge 06/05/2021 1241 by Cheron Schaumann, RN Outcome: Adequate for Discharge Goal: Will remain free from infection 06/05/2021 1241 by Cheron Schaumann, RN Outcome: Adequate for Discharge 06/05/2021 1241 by Cheron Schaumann, RN Outcome: Adequate for Discharge Goal: Diagnostic test results will improve 06/05/2021 1241 by Cheron Schaumann, RN Outcome: Adequate for Discharge 06/05/2021 1241 by Cheron Schaumann, RN Outcome: Adequate for Discharge Goal: Respiratory complications will improve 06/05/2021 1241 by Cheron Schaumann, RN Outcome: Adequate for Discharge 06/05/2021 1241 by Cheron Schaumann, RN Outcome: Adequate for Discharge Goal: Cardiovascular complication will be avoided 06/05/2021 1241 by Cheron Schaumann, RN Outcome: Adequate for Discharge 06/05/2021 1241 by Cheron Schaumann, RN Outcome: Adequate for Discharge   Problem: Activity: Goal: Risk for activity intolerance will decrease 06/05/2021 1241 by Cheron Schaumann, RN Outcome: Adequate for Discharge 06/05/2021 1241 by Cheron Schaumann, RN Outcome: Adequate for Discharge   Problem: Nutrition: Goal: Adequate nutrition will be maintained 06/05/2021 1241 by  Cheron Schaumann, RN Outcome: Adequate for Discharge 06/05/2021 1241 by Cheron Schaumann, RN Outcome: Adequate for Discharge   Problem: Coping: Goal: Level of anxiety will decrease 06/05/2021 1241 by Cheron Schaumann, RN Outcome: Adequate for Discharge 06/05/2021 1241 by Cheron Schaumann, RN Outcome: Adequate for Discharge   Problem: Pain Managment: Goal: General experience of comfort will improve 06/05/2021 1241 by Cheron Schaumann, RN Outcome: Adequate for Discharge 06/05/2021 1241 by Cheron Schaumann, RN Outcome: Adequate for Discharge   Problem: Safety: Goal: Ability to remain free from injury will improve 06/05/2021 1241 by Cheron Schaumann, RN Outcome: Adequate for Discharge 06/05/2021 1241 by Cheron Schaumann, RN Outcome: Adequate for Discharge   Problem: Skin Integrity: Goal: Risk for impaired skin integrity will decrease 06/05/2021 1241 by Cheron Schaumann, RN Outcome: Adequate for Discharge 06/05/2021 1241 by Cheron Schaumann, RN Outcome: Adequate for Discharge

## 2021-06-05 NOTE — Discharge Summary (Signed)
Physician Discharge Summary  Andrew Duncan RXV:[id number] DOB: 25-Oct-1945 DOA: 05/30/2021  PCP: Seward Carol, MD  Admit date: 05/30/2021 Discharge date: 06/05/2021  Admitted From: Home Disposition:  Home  Discharge Condition:Stable CODE STATUS:FULL Diet recommendation:  Carb Modified  Brief/Interim Summary: Patient is a 75 year old male with history of diabetes type 2, hypertension who presents here with tachycardia, tachypnea, fever.  Labs showed leukocytosis.  X-ray showed pneumonia in the right lower lung.  He was also found to have acute cholecystitis during this hospitalization and underwent IR guided drain placement.  General surgery and IR were following.Currently his respiratory status is stable.  Antibiotics have been changed to oral.  He is medically stable for discharge to home today.  He will follow-up with general surgery and IR as an outpatient.  Following problems were addressed during his hospitalization:  Sepsis due to pneumonia ([place]) Met SIRS criteria on admission with tachycardia tachypnea and fever, leukocytosis.   X-ray showed mild pneumonia on the right lower lung zone.  CT shows dense consolidation on the right.  Also small pleural effusion. Blood cultures so far negative.   Initially was on 1 g ceftriaxone, increase to 2 g ceftriaxone now on IV Zosyn. Transition to oral Augmentin   Liver abscess Secondary to direct involvement with cholecystitis. Treated with Zosyn. Clinically improving therefore will transition to oral Augmentin for 13 more days  Last day of antibiotic 06/18/2021. Follow-up with general surgery as an outpatient   Acute respiratory failure with hypoxia ([place]) Presents with pneumonia.  Her respiratory status progressively worsening despite aggressive measures with antibiotics and pulmonary toilet. Required BiPAP therapy due to severe respiratory distress. Now on room air.   Acute cholecystitis Patient presented with abdominal pain.  Seen  in ER on 11/26. CT abdomen negative for any acute abnormality had some cholelithiasis.  On presentation with right upper quadrant pain ultrasound abdomen negative but had a pneumonia on the same side. CT chest performed for pneumonia and respiratory distress showed gallbladder wall thickening with concern for cholecystitis as well as microperforation. Repeat CT abdomen on 12/2 shows evidence of liver abscess, microperforation with cholecystitis. General surgery and IR consulted, antibiotics changed to IV Zosyn.  Not a candidate for cholecystectomy  due to respiratory distress and pneumonia. Patient underwent IR guided PERC cholecystostomy drain placement. Pain improved.  Follow-up on cultures.  So far no growth.  Transition from IV Zosyn to Augmentin. He will follow up with general surgery as an outpatient   Essential hypertension Blood pressure elevated also has tachycardia. Currently on Lopressor and hydralazine.  Blood pressure stable today   Transaminitis LFTs trending upward from normal on 11/26 to in 300s and 12/1. Now trending down after drain placement. GI consulted appreciate assistance.  Currently signed off. Ultrasound abdomen negative for any acute abnormality.  Shows small volume ascites.  Check liver function test in a week   Pedal edema Lower extremity Doppler negative for DVT. Resolved   Type 2 diabetes mellitus without complication, with long-term current use of insulin (HCC) Hemoglobin A1c 8.6 Uncontrolled with hyperglycemia Continue home regimen on discharge.   Hyperlipidemia Not on any statin.   Discharge Diagnoses:  Principal Problem:   Sepsis due to pneumonia [facility]) Active Problems:   Essential hypertension   Transaminitis   Type 2 diabetes mellitus without complication, with long-term current use of insulin (HCC)   Hyperlipidemia   Hypokalemia   Hyponatremia   Pedal edema   Acute cholecystitis   Acute respiratory failure with hypoxia ([place])  Liver  abscess    Discharge Instructions  Discharge Instructions     Diet Carb Modified   Complete by: As directed    Discharge instructions   Complete by: As directed    1)Please take prescribed medications as instructed. 2)Follow up with general surgery on 07/07/2020 at 1:40 PM 3)Please follow-up with interventional as an outpatient.  Continue drain care at home.  You will be called by IR for follow-up 4)Monitor your blood pressure /blood sugars at home.  Follow-up with your PCP in a week.  Check your liver function test during the follow-up   Increase activity slowly   Complete by: As directed    No wound care   Complete by: As directed       Allergies as of 06/05/2021       Reactions   Versed [midazolam]         Medication List     STOP taking these medications    DIPHENHYDRAMINE HCL PO   losartan 25 MG tablet Commonly known as: COZAAR       TAKE these medications    amoxicillin-clavulanate 875-125 MG tablet Commonly known as: AUGMENTIN Take 1 tablet by mouth every 12 (twelve) hours for 13 days.   aspirin EC 81 MG tablet Take 81 mg by mouth daily.   BD Pen Needle Nano U/F 32G X 4 MM Misc Generic drug: Insulin Pen Needle USE NEEDLES FOR INJECTION TWICE A DAY   cholecalciferol 25 MCG (1000 UNIT) tablet Commonly known as: VITAMIN D Take 1,000 Units by mouth daily.   glipiZIDE 10 MG 24 hr tablet Commonly known as: GLUCOTROL XL Take 10 mg by mouth daily.   hydrALAZINE 25 MG tablet Commonly known as: APRESOLINE Take 1 tablet (25 mg total) by mouth every 8 (eight) hours.   Januvia 100 MG tablet Generic drug: sitaGLIPtin Take 100 mg by mouth daily.   Lantus SoloStar 100 UNIT/ML Solostar Pen Generic drug: insulin glargine 47 Units at bedtime.   latanoprost 0.005 % ophthalmic solution Commonly known as: XALATAN Place 1 drop into both eyes at bedtime.   metFORMIN 500 MG tablet Commonly known as: GLUCOPHAGE Take 500 mg by mouth 2 (two) times daily with  a meal.   metoprolol tartrate 25 MG tablet Commonly known as: LOPRESSOR Take 1 tablet (25 mg total) by mouth 2 (two) times daily.   multivitamin with minerals Tabs tablet Take 1 tablet by mouth daily.   ondansetron 4 MG tablet Commonly known as: ZOFRAN Take 1 tablet (4 mg total) by mouth every 6 (six) hours.   OZEMPIC (2 MG/DOSE) [place] Inject 0.5 mg into the skin once a week. Every Friday   pravastatin 20 MG tablet Commonly known as: PRAVACHOL Take 20 mg by mouth daily.   PROAIR HFA IN Inhale into the lungs.   timolol 0.5 % ophthalmic solution Commonly known as: TIMOPTIC Place 1 drop into both eyes 2 (two) times daily.        Follow-up Information     Ileana Roup, MD. Go on 07/07/2021.   Specialties: General Surgery, Colon and Rectal Surgery Why: Your appointment is 07/07/20 at 1:40pm Please arrive 30 minutes prior to your appointment to check in and fill out paperwork. Bring photo ID and insurance information. Contact information: [place] [address] [phone]         Mir, Paula Libra, MD Follow up.   Specialties: Interventional Radiology, Diagnostic Radiology, Radiology Why: Outpatient follow up with routine drain exchange needed  in 6-8 weeks. A scheduler from our office will call you with a date/time of your appointment. Please flush the drain once daily with 5-10 ml NS and document the daily output. Keep the site clean/dry. Please call our office with any questions prior to your appointment. Contact information: [address] [place]  [address] [phone]                Allergies  Allergen Reactions   Versed [Midazolam]     Consultations: IR, general surgery   Procedures/Studies: DG Chest 2 View  Result Date: 05/30/2021 CLINICAL DATA:  Abnormal breath sounds. EXAM: CHEST - 2 VIEW COMPARISON:  05/27/2021 FINDINGS: Right base collapse/consolidation with small right pleural effusion noted. Similar  asymmetric elevation right hemidiaphragm. Left lung clear. No pulmonary edema or evidence of pneumothorax. The cardiopericardial silhouette is within normal limits for size. The visualized bony structures of the thorax show no acute abnormality. IMPRESSION: Right base collapse/consolidation with small right pleural effusion. Electronically Signed   By: Misty Stanley M.D.   On: 05/30/2021 10:25   DG Chest 2 View  Result Date: 05/27/2021 CLINICAL DATA:  Patient complains of nausea, vomiting, and weakness for 3 days. EXAM: CHEST - 2 VIEW COMPARISON:  Chest radiograph 07/09/2018 FINDINGS: The heart size and mediastinal contours are within normal limits. Both lungs are clear. No acute osseous abnormality. IMPRESSION: No active cardiopulmonary disease. Electronically Signed   By: Ileana Roup M.D.   On: 05/27/2021 14:38   CT Angio Chest Pulmonary Embolism (PE) W or WO Contrast  Addendum Date: 06/01/2021   ADDENDUM REPORT: 06/01/2021 17:47 ADDENDUM: These results were called by telephone at the time of interpretation on 06/01/2021 at 5:47 pm to provider [facility] PATEL , who verbally acknowledged these results. Electronically Signed   By: Markus Daft M.D.   On: 06/01/2021 17:47   Result Date: 06/01/2021 CLINICAL DATA:  Pulmonary embolism suspected, high probability. Elevated D-dimer. EXAM: CT ANGIOGRAPHY CHEST WITH CONTRAST TECHNIQUE: Multidetector CT imaging of the chest was performed using the standard protocol during bolus administration of intravenous contrast. Multiplanar CT image reconstructions and MIPs were obtained to evaluate the vascular anatomy. CONTRAST:  21m OMNIPAQUE IOHEXOL 350 MG/ML SOLN COMPARISON:  CT abdomen pelvis 05/27/2021 and ultrasound 05/31/2021 FINDINGS: Cardiovascular: Suboptimal evaluation for pulmonary emboli due to the timing of the exam. Large amount of the contrast is already in the thoracic aorta. However, the main pulmonary arteries are patent. Cannot evaluate the lobar and more  distal pulmonary artery branches. Normal caliber of the thoracic aorta. Heart size is normal without significant pericardial effusion. Mediastinum/Nodes: No significant lymph node enlargement in the mediastinum. No axillary lymph node enlargement. Lungs/Pleura: Compared to 05/27/2021, there is marked volume loss and consolidation in the right lower chest with a small amount of loculated right pleural fluid. Minimal aeration in the right lower lobe or right middle lobe. Significant motion artifact in the lungs. No significant airspace disease or consolidation in left lung. No significant left pleural fluid. Upper Abdomen: Gallbladder appears to be distended with wall irregularity. This represents a change from the previous CT. Small amount of high-density material in the gallbladder could represent sludge or small stones. In addition, there is small amount of perihepatic ascites which may be within the subcapsular space. This perihepatic fluid is new from the previous CT but there was fluid in this area on the recent ultrasound. The entire abdomen is not imaged. No significant gastric distension. Musculoskeletal: No acute bone abnormality. Review of  the MIP images confirms the above findings. IMPRESSION: 1. Volume loss and consolidation in right lower lobe and right middle lobe. Small loculated right pleural effusion. Findings could be secondary to pneumonia. 2. Limited evaluation for pulmonary embolism due to motion artifact and poor opacification of the pulmonary arteries. However, the main pulmonary arteries are patent and no evidence for a large central pulmonary embolism. 3. Abnormal appearance of the gallbladder with irregular fluid collections along the inferior aspect of the liver. Significant change in the gallbladder since 05/27/2021 and findings are concerning for acute cholecystitis. Abdominal findings could be better characterized with a dedicated CT of the abdomen and pelvis with IV contrast.  Electronically Signed: By: Markus Daft M.D. On: 06/01/2021 17:37   CT ABDOMEN PELVIS W CONTRAST  Result Date: 06/02/2021 CLINICAL DATA:  Persistent right upper quadrant pain. Elevated white blood cell count. Evaluate gallbladder. EXAM: CT ABDOMEN AND PELVIS WITH CONTRAST TECHNIQUE: Multidetector CT imaging of the abdomen and pelvis was performed using the standard protocol following bolus administration of intravenous contrast. CONTRAST:  18m OMNIPAQUE IOHEXOL 350 MG/ML SOLN COMPARISON:  CT abdomen pelvis 05/27/2021 FINDINGS: Lower chest: Partially visualized right pleural effusion with atelectatic collapse of the right lower lobe. Minimal dependent atelectasis in the left lung base. Hepatobiliary: The gallbladder remains abnormally distended measuring 6 cm transverse x 10.9 cm in length (series 2, image 25). The gallbladder wall is more ill-defined compared to prior exams with diffuse mild fat stranding. A few subcentimeter hyperechoic foci the gallbladder neck likely correspond to the sludge seen on prior ultrasound. There is discontinuity of the superomedial gallbladder wall with hypodense fluid extending into the adjacent hepatic parenchyma (series 2, image 21; series 5, image 36). There are several subcapsular fluid collections within the inferior aspect of the right lobe of the liver, the largest measuring 4.8 x 1.4 cm (series 2, image 25). Pancreas: Diffuse moderate to severe parenchymal volume loss. The main pancreatic duct is not dilated. No peripancreatic fluid collection or fat stranding. Spleen: Normal in size without focal abnormality. Adrenals/Urinary Tract: Adrenal glands are unremarkable. Kidneys are normal, without renal calculi, focal lesion, or hydronephrosis. Bladder is minimally distended. Stomach/Bowel: Stomach is within normal limits. Appendix appears normal. Scattered diverticula throughout the colon. No evidence of bowel wall thickening, distention, or inflammatory changes.  Vascular/Lymphatic: No significant vascular findings are present. No enlarged abdominal or pelvic lymph nodes. Reproductive: Enlarged prostate. Other: No abdominal wall hernia or abnormality. Musculoskeletal: No acute osseous finding. 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 IMPRESSION: 1. Cholecystitis. 2. Focal discontinuity of the superomedial gallbladder wall, suspicious for perforation into the adjacent hepatic parenchyma. 3. Several small subcapsular fluid collections in the inferior right liver, suspicious for liver abscesses secondary to cholecystitis. 4. Partially visualized right pleural effusion with atelectatic collapse of the right lower lobe. Electronically Signed   By: LIleana RoupM.D.   On: 06/02/2021 14:17   CT ABDOMEN PELVIS W CONTRAST  Result Date: 05/27/2021 CLINICAL DATA:  Nausea and vomiting EXAM: CT ABDOMEN AND PELVIS WITH CONTRAST TECHNIQUE: Multidetector CT imaging of the abdomen and pelvis was performed using the standard protocol following bolus administration of intravenous contrast. CONTRAST:  1044mOMNIPAQUE IOHEXOL 300 MG/ML  SOLN COMPARISON:  None. FINDINGS: Lower chest: No acute abnormality. Hepatobiliary: No focal liver abnormality. Nonspecific hydropic gallbladder. Punctate hyperdensity layering within the gallbladder lumen may represent tiny gallstones. No gallbladder wall thickening or pericholecystic fluid. No biliary dilatation. Pancreas: No focal lesion. Normal pancreatic contour. No surrounding  inflammatory changes. No main pancreatic ductal dilatation. Spleen: Normal in size without focal abnormality. Adrenals/Urinary Tract: No adrenal nodule bilaterally. Bilateral kidneys enhance symmetrically. No hydronephrosis. No hydroureter. The urinary bladder is unremarkable. Stomach/Bowel: Stomach is within normal limits. No evidence of bowel wall thickening or dilatation. Scattered colonic diverticulosis. Appendix appears normal.  Vascular/Lymphatic: No abdominal aorta or iliac aneurysm. Mild atherosclerotic plaque of the aorta and its branches. No abdominal, pelvic, or inguinal lymphadenopathy. Reproductive: The prostate is enlarged measuring up to 5 cm. Other: No intraperitoneal free fluid. No intraperitoneal free gas. No organized fluid collection. Musculoskeletal: No abdominal wall hernia or abnormality. No suspicious lytic or blastic osseous lesions. No acute displaced fracture. L5-S1 intervertebral disc space vacuum phenomenon and endplate sclerosis. IMPRESSION: 1. Possible cholelithiasis. 2. Colonic diverticulosis with no acute diverticulitis. 3. Prostatomegaly. 4.  Aortic Atherosclerosis (ICD10-I70.0). Electronically Signed   By: Iven Finn M.D.   On: 05/27/2021 15:39   IR Perc Cholecystostomy  Result Date: 06/02/2021 INDICATION: Acute cholecystitis, right upper quadrant pain and respiratory distress EXAM: Ultrasound for ostomy guided cholecystostomy drain placement MEDICATIONS: Zosyn IV 3.375 g; The antibiotic was administered within an appropriate time frame prior to the initiation of the procedure. ANESTHESIA/SEDATION: None FLUOROSCOPY TIME:  Fluoroscopy Time: 0 minutes 36 seconds (11 mGy). COMPLICATIONS: None immediate. PROCEDURE: Informed written consent was obtained from the patient after a thorough discussion of the procedural risks, benefits and alternatives. All questions were addressed. Maximal Sterile Barrier Technique was utilized including caps, mask, sterile gowns, sterile gloves, sterile drape, hand hygiene and skin antiseptic. A timeout was performed prior to the initiation of the procedure. Patient positioned supine on the procedure table. The right upper quadrant skin prepped and draped in usual fashion. Following local lidocaine administration, 21 gauge needle was inserted into the gallbladder utilizing continuous ultrasound guidance. 21 gauge needle exchanged for a transitional dilator set over 0.018 inch  guidewire. Transitional dilator set exchanged for 10.2 [place] multipurpose pigtail drain over 0.035 inch guidewire. 10 mL sample obtained from the gallbladder and sent for Gram stain and culture. Contrast administered through the drain showed appropriate positioning within the gallbladder lumen. Drain secured to skin with suture and connected to bag. IMPRESSION: 10.2 French cholecystostomy drain placed using ultrasound and fluoroscopy guidance. Electronically Signed   By: Miachel Roux M.D.   On: 06/02/2021 14:16   VAS [place] LOWER EXTREMITY VENOUS (DVT)  Result Date: 06/01/2021  Lower Venous DVT Study Patient Name:  ALONTAE CHALOUX  Date of Exam:   06/01/2021 Medical Rec #: [id number]       Accession #:    [id number] Date of Birth: May 31, 1946       Patient Gender: M Patient Age:   20 years Exam Location:  [facility] Procedure:      VAS [place] LOWER EXTREMITY VENOUS (DVT) Referring Phys: PRANAV PATEL --------------------------------------------------------------------------------  Indications: Edema.  Risk Factors: None identified. Comparison Study: No prior studies. Performing Technologist: Oliver Hum RVT  Examination Guidelines: A complete evaluation includes B-mode imaging, spectral Doppler, color Doppler, and power Doppler as needed of all accessible portions of each vessel. Bilateral testing is considered an integral part of a complete examination. Limited examinations for reoccurring indications may be performed as noted. The reflux portion of the exam is performed with the patient in reverse Trendelenburg.  +---------+---------------+---------+-----------+----------+--------------+ RIGHT    CompressibilityPhasicitySpontaneityPropertiesThrombus Aging +---------+---------------+---------+-----------+----------+--------------+ CFV      Full           Yes      Yes                                 +---------+---------------+---------+-----------+----------+--------------+  SFJ      Full                                                         +---------+---------------+---------+-----------+----------+--------------+ FV Prox  Full                                                        +---------+---------------+---------+-----------+----------+--------------+ FV Mid   Full                                                        +---------+---------------+---------+-----------+----------+--------------+ FV DistalFull                                                        +---------+---------------+---------+-----------+----------+--------------+ PFV      Full                                                        +---------+---------------+---------+-----------+----------+--------------+ POP      Full           Yes      Yes                                 +---------+---------------+---------+-----------+----------+--------------+ PTV      Full                                                        +---------+---------------+---------+-----------+----------+--------------+ PERO     Full                                                        +---------+---------------+---------+-----------+----------+--------------+   +---------+---------------+---------+-----------+----------+--------------+ LEFT     CompressibilityPhasicitySpontaneityPropertiesThrombus Aging +---------+---------------+---------+-----------+----------+--------------+ CFV      Full           Yes      Yes                                 +---------+---------------+---------+-----------+----------+--------------+ SFJ      Full                                                        +---------+---------------+---------+-----------+----------+--------------+  FV Prox  Full                                                        +---------+---------------+---------+-----------+----------+--------------+ FV Mid   Full                                                         +---------+---------------+---------+-----------+----------+--------------+ FV DistalFull                                                        +---------+---------------+---------+-----------+----------+--------------+ PFV      Full                                                        +---------+---------------+---------+-----------+----------+--------------+ POP      Full           Yes      Yes                                 +---------+---------------+---------+-----------+----------+--------------+ PTV      Full                                                        +---------+---------------+---------+-----------+----------+--------------+ PERO     Full                                                        +---------+---------------+---------+-----------+----------+--------------+     Summary: RIGHT: - There is no evidence of deep vein thrombosis in the lower extremity.  - No cystic structure found in the popliteal fossa.  LEFT: - There is no evidence of deep vein thrombosis in the lower extremity.  - No cystic structure found in the popliteal fossa.  *See table(s) above for measurements and observations. Electronically signed by Monica Martinez MD on 06/01/2021 at 5:30:44 PM.    Final    US Abdomen Limited RUQ (LIVER/GB)  Result Date: 05/31/2021 CLINICAL DATA:  Elevated LFTs. EXAM: ULTRASOUND ABDOMEN LIMITED RIGHT UPPER QUADRANT COMPARISON:  CT scan 05/27/2021 FINDINGS: Gallbladder: No gallstones or wall thickening visualized. No sonographic Murphy sign noted by sonographer. Dependent sludge evident. Common bile duct: Diameter: 3 mm Liver: Coarsening of hepatic echotexture suggests fatty deposition. No focal abnormality evident. Portal vein is patent on color Doppler imaging with normal direction of blood flow towards the liver. Other: Small volume ascites IMPRESSION: Small volume ascites. Probable fatty deposition in the liver. Electronically Signed  By: Misty Stanley M.D.   On: 05/31/2021 06:37      Subjective: Patient seen and examined at the bedside this morning.  Hemodynamically stable for discharge.  I called and discussed the discharge planning with the wife on phone  Discharge Exam: Vitals:   06/04/21 2059 06/05/21 0356  BP: (!) 151/64 138/77  Pulse: 98 80  Resp: 20 17  Temp: 97.8 F (36.6 C) 97.8 F (36.6 C)  SpO2: 98% 95%   Vitals:   06/04/21 0808 06/04/21 1251 06/04/21 2059 06/05/21 0356  BP:  (!) 162/77 (!) 151/64 138/77  Pulse:  87 98 80  Resp:  _0 Temp:  97.8 F (36.6 C) 97.8 F (36.6 C) 97.8 F (36.6 C)  TempSrc:  Oral Oral Oral  SpO2: 98% 98% 98% 95%  Weight:      Height:        General: Pt is alert, awake, not in acute distress,obese Cardiovascular: RRR, S1/S2 +, no rubs, no gallops Respiratory: CTA bilaterally, no wheezing, no rhonchi Abdominal: Soft, NT, ND, bowel sounds + ,right upper quadrant drain Extremities: no edema, no cyanosis    The results of significant diagnostics from this hospitalization (including imaging, microbiology, ancillary and laboratory) are listed below for reference.     Microbiology: Recent Results (from the past 240 hour(s))  Resp Panel by RT-PCR (Flu A&B, Covid) Urine, Clean Catch     Status: None   Collection Time: 05/27/21 12:31 PM   Specimen: Urine, Clean Catch; Nasopharyngeal(NP) swabs in vial transport medium  Result Value Ref Range Status   SARS Coronavirus 2 by RT PCR NEGATIVE NEGATIVE Final    Comment: (NOTE) SARS-CoV-2 target nucleic acids are NOT DETECTED.  The SARS-CoV-2 RNA is generally detectable in upper respiratory specimens during the acute phase of infection. The lowest concentration of SARS-CoV-2 viral copies this assay can detect is 138 copies/mL. A negative result does not preclude SARS-Cov-2 infection and should not be used as the sole basis for treatment or other patient management decisions. A negative result may occur with  improper  specimen collection/handling, submission of specimen other than nasopharyngeal swab, presence of viral mutation(s) within the areas targeted by this assay, and inadequate number of viral copies(<138 copies/mL). A negative result must be combined with clinical observations, patient history, and epidemiological information. The expected result is Negative.  Fact Sheet for Patients:  EntrepreneurPulse.com.au  Fact Sheet for Healthcare Providers:  IncredibleEmployment.be  This test is no t yet approved or cleared by the [place] FDA and  has been authorized for detection and/or diagnosis of SARS-CoV-2 by FDA under an Emergency Use Authorization (EUA). This EUA will remain  in effect (meaning this test can be used) for the duration of the COVID-19 declaration under Section 564(b)(1) of the Act, 21 U.S.C.section 360bbb-3(b)(1), unless the authorization is terminated  or revoked sooner.       Influenza A by PCR NEGATIVE NEGATIVE Final   Influenza B by PCR NEGATIVE NEGATIVE Final    Comment: (NOTE) The Xpert Xpress SARS-CoV-2/FLU/RSV plus assay is intended as an aid in the diagnosis of influenza from Nasopharyngeal swab specimens and should not be used as a sole basis for treatment. Nasal washings and aspirates are unacceptable for Xpert Xpress SARS-CoV-2/FLU/RSV testing.  Fact Sheet for Patients: EntrepreneurPulse.com.au  Fact Sheet for Healthcare Providers: IncredibleEmployment.be  This test is not yet approved or cleared by the [place] FDA and has been authorized for detection and/or diagnosis of SARS-CoV-2 by FDA  under an Emergency Use Authorization (EUA). This EUA will remain in effect (meaning this test can be used) for the duration of the COVID-19 declaration under Section 564(b)(1) of the Act, 21 U.S.C. section 360bbb-3(b)(1), unless the authorization is terminated or revoked.  Performed at  [facility], [address], [place], [place] [address]   Resp Panel by RT-PCR (Flu A&B, Covid) Nasopharyngeal Swab     Status: None   Collection Time: 05/30/21  6:05 PM   Specimen: Nasopharyngeal Swab; Nasopharyngeal(NP) swabs in vial transport medium  Result Value Ref Range Status   SARS Coronavirus 2 by RT PCR NEGATIVE NEGATIVE Final    Comment: (NOTE) SARS-CoV-2 target nucleic acids are NOT DETECTED.  The SARS-CoV-2 RNA is generally detectable in upper respiratory specimens during the acute phase of infection. The lowest concentration of SARS-CoV-2 viral copies this assay can detect is 138 copies/mL. A negative result does not preclude SARS-Cov-2 infection and should not be used as the sole basis for treatment or other patient management decisions. A negative result may occur with  improper specimen collection/handling, submission of specimen other than nasopharyngeal swab, presence of viral mutation(s) within the areas targeted by this assay, and inadequate number of viral copies(<138 copies/mL). A negative result must be combined with clinical observations, patient history, and epidemiological information. The expected result is Negative.  Fact Sheet for Patients:  EntrepreneurPulse.com.au  Fact Sheet for Healthcare Providers:  IncredibleEmployment.be  This test is no t yet approved or cleared by the [place] FDA and  has been authorized for detection and/or diagnosis of SARS-CoV-2 by FDA under an Emergency Use Authorization (EUA). This EUA will remain  in effect (meaning this test can be used) for the duration of the COVID-19 declaration under Section 564(b)(1) of the Act, 21 U.S.C.section 360bbb-3(b)(1), unless the authorization is terminated  or revoked sooner.       Influenza A by PCR NEGATIVE NEGATIVE Final   Influenza B by PCR NEGATIVE NEGATIVE Final    Comment: (NOTE) The Xpert Xpress  SARS-CoV-2/FLU/RSV plus assay is intended as an aid in the diagnosis of influenza from Nasopharyngeal swab specimens and should not be used as a sole basis for treatment. Nasal washings and aspirates are unacceptable for Xpert Xpress SARS-CoV-2/FLU/RSV testing.  Fact Sheet for Patients: EntrepreneurPulse.com.au  Fact Sheet for Healthcare Providers: IncredibleEmployment.be  This test is not yet approved or cleared by the [place] FDA and has been authorized for detection and/or diagnosis of SARS-CoV-2 by FDA under an Emergency Use Authorization (EUA). This EUA will remain in effect (meaning this test can be used) for the duration of the COVID-19 declaration under Section 564(b)(1) of the Act, 21 U.S.C. section 360bbb-3(b)(1), unless the authorization is terminated or revoked.  Performed at [facility], [place] [address] [place], [place] [address]   Culture, blood (Routine X 2) w Reflex to ID Panel     Status: None   Collection Time: 05/30/21 10:46 PM   Specimen: BLOOD  Result Value Ref Range Status   Specimen Description   Final    BLOOD LEFT ANTECUBITAL Performed at [facility] [address], [place], [place] [address]    Special Requests   Final    BOTTLES DRAWN AEROBIC AND ANAEROBIC Blood Culture adequate volume Performed at [facility] [address] [place], [place] [address]    Culture   Final    NO GROWTH 5 DAYS Performed at [facility], [place] [address]  [place], [place] [address]    Report Status 06/05/2021 FINAL  Final  Culture, blood (Routine X 2) w Reflex to ID Panel     Status: None   Collection Time: 05/30/21 10:49 PM   Specimen: BLOOD  Result Value Ref Range Status   Specimen Description   Final    BLOOD RIGHT ANTECUBITAL Performed at [facility] [address] [place], [place] [address]    Special Requests   Final    BOTTLES  DRAWN AEROBIC AND ANAEROBIC Blood Culture adequate volume Performed at [facility] [address], [place], [place] [address]    Culture   Final    NO GROWTH 5 DAYS Performed at [facility], [place] [address] [place], [place] [address]    Report Status 06/05/2021 FINAL  Final  Aerobic/Anaerobic Culture w Gram Stain (surgical/deep wound)     Status: None (Preliminary result)   Collection Time: 06/02/21  1:45 PM   Specimen: Gallbladder; Bile  Result Value Ref Range Status   Specimen Description   Final    GALL BLADDER Performed at [facility] [address], [place], [place] [address]    Special Requests   Final    NONE Performed at [facility], [place] [address] [place], [place] [address]    Gram Stain   Final    FEW WBC PRESENT, PREDOMINANTLY MONONUCLEAR NO ORGANISMS SEEN    Culture   Final    NO GROWTH 2 DAYS NO ANAEROBES ISOLATED; CULTURE IN PROGRESS FOR 5 DAYS Performed at [facility] [address] [place],  [address]    Report Status PENDING  Incomplete     Labs: BNP (last 3 results) No results for input(s): BNP in the last 8760 hours. Basic Metabolic Panel: Recent Labs  Lab 06/01/21 0349 06/02/21 0420 06/03/21 0454 06/04/21 0917 06/05/21 0403  NA 133* 137 139 136 138  K 3.4* 3.5 3.4* 3.6 3.7  CL 104 107 106 106 108  CO2 _0 GLUCOSE 273* 131* 132* 154* 112*  BUN 24* 25* _1 CREATININE 0.97 1.02 0.94 0.94 0.82  CALCIUM 8.4* 8.5* 8.1* 8.2* 8.0*  MG 2.3 2.4 2.4 2.3 2.2   Liver Function Tests: Recent Labs  Lab 06/01/21 0349 06/02/21 0420 06/03/21 0454 06/04/21 0917 06/05/21 0403  AST 318* 76* 122* 79* 62*  ALT 381* 250* 254* 213* 168*  ALKPHOS 181* 200* 231* 235* 204*  BILITOT 1.1 1.0 0.8 0.7 0.4  PROT 6.2* 6.4* 6.2* 6.2* 5.6*  ALBUMIN 2.7* 2.8* 2.5* 2.7* 2.4*   No results for input(s): LIPASE, AMYLASE in the last 168 hours. No results for input(s):  AMMONIA in the last 168 hours. CBC: Recent Labs  Lab 06/01/21 0349 06/02/21 0420 06/03/21 0454 06/04/21 0917 06/05/21 0403  WBC 12.0* 13.6* 10.8* 7.7 7.8  NEUTROABS 9.1* 10.2* 7.3 4.3 4.5  HGB 11.8* 12.0* 11.6* 12.0* 11.1*  HCT 35.7* 35.7* 34.9* 36.0* 33.3*  MCV 92.5 90.8 91.6 92.5 92.2  PLT 274 299 324 381 373   Cardiac Enzymes: No results for input(s): CKTOTAL, CKMB, CKMBINDEX, TROPONINI in the last 168 hours. BNP: Invalid input(s): POCBNP CBG: Recent Labs  Lab 06/04/21 1636 06/04/21 2056 06/05/21 0749 06/05/21 0821 06/05/21 1118  GLUCAP 166* 212* 68* 76 235*   D-Dimer No results for input(s): DDIMER in the last 72 hours. Hgb A1c No results for input(s): HGBA1C in the last 72 hours. Lipid  Profile No results for input(s): CHOL, HDL, LDLCALC, TRIG, CHOLHDL, LDLDIRECT in the last 72 hours. Thyroid function studies No results for input(s): TSH, T4TOTAL, T3FREE, THYROIDAB in the last 72 hours.  Invalid input(s): FREET3 Anemia work up No results for input(s): VITAMINB12, FOLATE, FERRITIN, TIBC, IRON, RETICCTPCT in the last 72 hours. Urinalysis    Component Value Date/Time   COLORURINE YELLOW 05/27/2021 1231   APPEARANCEUR CLEAR 05/27/2021 1231   LABSPEC 1.034 (H) 05/27/2021 1231   PHURINE 5.0 05/27/2021 1231   GLUCOSEU >1,000 (A) 05/27/2021 1231   HGBUR NEGATIVE 05/27/2021 1231   BILIRUBINUR NEGATIVE 05/27/2021 1231   KETONESUR TRACE (A) 05/27/2021 1231   PROTEINUR TRACE (A) 05/27/2021 1231   NITRITE NEGATIVE 05/27/2021 1231   LEUKOCYTESUR NEGATIVE 05/27/2021 1231   Sepsis Labs Invalid input(s): PROCALCITONIN,  WBC,  LACTICIDVEN Microbiology Recent Results (from the past 240 hour(s))  Resp Panel by RT-PCR (Flu A&B, Covid) Urine, Clean Catch     Status: None   Collection Time: 05/27/21 12:31 PM   Specimen: Urine, Clean Catch; Nasopharyngeal(NP) swabs in vial transport medium  Result Value Ref Range Status   SARS Coronavirus 2 by RT PCR NEGATIVE NEGATIVE  Final    Comment: (NOTE) SARS-CoV-2 target nucleic acids are NOT DETECTED.  The SARS-CoV-2 RNA is generally detectable in upper respiratory specimens during the acute phase of infection. The lowest concentration of SARS-CoV-2 viral copies this assay can detect is 138 copies/mL. A negative result does not preclude SARS-Cov-2 infection and should not be used as the sole basis for treatment or other patient management decisions. A negative result may occur with  improper specimen collection/handling, submission of specimen other than nasopharyngeal swab, presence of viral mutation(s) within the areas targeted by this assay, and inadequate number of viral copies(<138 copies/mL). A negative result must be combined with clinical observations, patient history, and epidemiological information. The expected result is Negative.  Fact Sheet for Patients:  EntrepreneurPulse.com.au  Fact Sheet for Healthcare Providers:  IncredibleEmployment.be  This test is no t yet approved or cleared by the [place] FDA and  has been authorized for detection and/or diagnosis of SARS-CoV-2 by FDA under an Emergency Use Authorization (EUA). This EUA will remain  in effect (meaning this test can be used) for the duration of the COVID-19 declaration under Section 564(b)(1) of the Act, 21 U.S.C.section 360bbb-3(b)(1), unless the authorization is terminated  or revoked sooner.       Influenza A by PCR NEGATIVE NEGATIVE Final   Influenza B by PCR NEGATIVE NEGATIVE Final    Comment: (NOTE) The Xpert Xpress SARS-CoV-2/FLU/RSV plus assay is intended as an aid in the diagnosis of influenza from Nasopharyngeal swab specimens and should not be used as a sole basis for treatment. Nasal washings and aspirates are unacceptable for Xpert Xpress SARS-CoV-2/FLU/RSV testing.  Fact Sheet for Patients: EntrepreneurPulse.com.au  Fact Sheet for Healthcare  Providers: IncredibleEmployment.be  This test is not yet approved or cleared by the [place] FDA and has been authorized for detection and/or diagnosis of SARS-CoV-2 by FDA under an Emergency Use Authorization (EUA). This EUA will remain in effect (meaning this test can be used) for the duration of the COVID-19 declaration under Section 564(b)(1) of the Act, 21 U.S.C. section 360bbb-3(b)(1), unless the authorization is terminated or revoked.  Performed at [facility], [address] [place], [place] [address]   Resp Panel by RT-PCR (Flu A&B, Covid) Nasopharyngeal Swab     Status: None   Collection Time: 05/30/21  6:05  PM   Specimen: Nasopharyngeal Swab; Nasopharyngeal(NP) swabs in vial transport medium  Result Value Ref Range Status   SARS Coronavirus 2 by RT PCR NEGATIVE NEGATIVE Final    Comment: (NOTE) SARS-CoV-2 target nucleic acids are NOT DETECTED.  The SARS-CoV-2 RNA is generally detectable in upper respiratory specimens during the acute phase of infection. The lowest concentration of SARS-CoV-2 viral copies this assay can detect is 138 copies/mL. A negative result does not preclude SARS-Cov-2 infection and should not be used as the sole basis for treatment or other patient management decisions. A negative result may occur with  improper specimen collection/handling, submission of specimen other than nasopharyngeal swab, presence of viral mutation(s) within the areas targeted by this assay, and inadequate number of viral copies(<138 copies/mL). A negative result must be combined with clinical observations, patient history, and epidemiological information. The expected result is Negative.  Fact Sheet for Patients:  EntrepreneurPulse.com.au  Fact Sheet for Healthcare Providers:  IncredibleEmployment.be  This test is no t yet approved or cleared by the [place] FDA and  has been  authorized for detection and/or diagnosis of SARS-CoV-2 by FDA under an Emergency Use Authorization (EUA). This EUA will remain  in effect (meaning this test can be used) for the duration of the COVID-19 declaration under Section 564(b)(1) of the Act, 21 U.S.C.section 360bbb-3(b)(1), unless the authorization is terminated  or revoked sooner.       Influenza A by PCR NEGATIVE NEGATIVE Final   Influenza B by PCR NEGATIVE NEGATIVE Final    Comment: (NOTE) The Xpert Xpress SARS-CoV-2/FLU/RSV plus assay is intended as an aid in the diagnosis of influenza from Nasopharyngeal swab specimens and should not be used as a sole basis for treatment. Nasal washings and aspirates are unacceptable for Xpert Xpress SARS-CoV-2/FLU/RSV testing.  Fact Sheet for Patients: EntrepreneurPulse.com.au  Fact Sheet for Healthcare Providers: IncredibleEmployment.be  This test is not yet approved or cleared by the [place] FDA and has been authorized for detection and/or diagnosis of SARS-CoV-2 by FDA under an Emergency Use Authorization (EUA). This EUA will remain in effect (meaning this test can be used) for the duration of the COVID-19 declaration under Section 564(b)(1) of the Act, 21 U.S.C. section 360bbb-3(b)(1), unless the authorization is terminated or revoked.  Performed at [facility], [place] [address], [place], [place] [address]   Culture, blood (Routine X 2) w Reflex to ID Panel     Status: None   Collection Time: 05/30/21 10:46 PM   Specimen: BLOOD  Result Value Ref Range Status   Specimen Description   Final    BLOOD LEFT ANTECUBITAL Performed at [facility] [address], [place], [place] [address]    Special Requests   Final    BOTTLES DRAWN AEROBIC AND ANAEROBIC Blood Culture adequate volume Performed at [facility] [address], [place], [place] [address]    Culture   Final    NO  GROWTH 5 DAYS Performed at [facility], [place] [address], [place], [place] [address]    Report Status 06/05/2021 FINAL  Final  Culture, blood (Routine X 2) w Reflex to ID Panel     Status: None   Collection Time: 05/30/21 10:49 PM   Specimen: BLOOD  Result Value Ref Range Status   Specimen Description   Final    BLOOD RIGHT ANTECUBITAL Performed at [facility] [address], [place], [place] [address]    Special Requests  Final    BOTTLES DRAWN AEROBIC AND ANAEROBIC Blood Culture adequate volume Performed at [facility] [address], [place], [place] [address]    Culture   Final    NO GROWTH 5 DAYS Performed at [facility], [place] [address] [place], [place] [address]    Report Status 06/05/2021 FINAL  Final  Aerobic/Anaerobic Culture w Gram Stain (surgical/deep wound)     Status: None (Preliminary result)   Collection Time: 06/02/21  1:45 PM   Specimen: Gallbladder; Bile  Result Value Ref Range Status   Specimen Description   Final    GALL BLADDER Performed at [facility] [address], [place], [place] [address]    Special Requests   Final    NONE Performed at [facility], [place] [address], [place], [place] [address]    Gram Stain   Final    FEW WBC PRESENT, PREDOMINANTLY MONONUCLEAR NO ORGANISMS SEEN    Culture   Final    NO GROWTH 2 DAYS NO ANAEROBES ISOLATED; CULTURE IN PROGRESS FOR 5 DAYS Performed at [facility] [address] [place], [place] [address]    Report Status PENDING  Incomplete    Please note: You were cared for by a hospitalist during your hospital stay. Once you are discharged, your primary care physician will handle any further medical issues. Please note that NO REFILLS for any discharge medications will be authorized once you are discharged, as it is imperative that you return to your primary care physician (or establish a relationship with a  primary care physician if you do not have one) for your post hospital discharge needs so that they can reassess your need for medications and monitor your lab values.    Time coordinating discharge: 40 minutes  SIGNED:   Shelly Coss, MD  [facility] 06/05/2021, 11:22 AM Pager [phone]  If 7PM-7AM, please contact night-coverage [email or web address] Password [email or web address]

## 2021-06-05 NOTE — TOC Progression Note (Signed)
Transition of Care [facility]) - Progression Note    Patient Details  Name: Andrew Duncan MRN: [id number] Date of Birth: March 20, 1946  Transition of Care [facility]) CM/SW Contact  Geni Bers, RN Phone Number: 06/05/2021, 11:58 AM  Clinical Narrative:    Pt and wife declined HHRN related to niece being an Charity fundraiser and was at bedside to state she would take care of drain.    Expected Discharge Plan: Home/Self Care Barriers to Discharge: No Barriers Identified  Expected Discharge Plan and Services Expected Discharge Plan: Home/Self Care       Living arrangements for the past 2 months: Single Family Home Expected Discharge Date: 06/05/21                                     Social Determinants of Health (SDOH) Interventions    Readmission Risk Interventions No flowsheet data found.

## 2021-06-05 NOTE — Progress Notes (Signed)
Jamie in IR to place DC instructions for patient prior to going home. Will go over drain care with patient once wife arrives.

## 2021-06-05 NOTE — Progress Notes (Signed)
AM blood sugar 68. No s/s of hypoglycemia. Pt drank 4oz oj and assisted with ordering breakfast. Blood sugar recheck 78. 4oz cranberry juice given in addition.

## 2021-06-07 LAB — AEROBIC/ANAEROBIC CULTURE W GRAM STAIN (SURGICAL/DEEP WOUND): Culture: NO GROWTH

## 2021-06-09 ENCOUNTER — Ambulatory Visit: Payer: Medicare Other | Admitting: Cardiovascular Disease

## 2021-06-09 DIAGNOSIS — R6521 Severe sepsis with septic shock: Secondary | ICD-10-CM | POA: Diagnosis not present

## 2021-06-09 DIAGNOSIS — I10 Essential (primary) hypertension: Secondary | ICD-10-CM | POA: Diagnosis not present

## 2021-06-09 DIAGNOSIS — J690 Pneumonitis due to inhalation of food and vomit: Secondary | ICD-10-CM | POA: Diagnosis not present

## 2021-06-09 DIAGNOSIS — R7989 Other specified abnormal findings of blood chemistry: Secondary | ICD-10-CM | POA: Diagnosis not present

## 2021-06-09 DIAGNOSIS — J9600 Acute respiratory failure, unspecified whether with hypoxia or hypercapnia: Secondary | ICD-10-CM | POA: Diagnosis not present

## 2021-06-09 DIAGNOSIS — A419 Sepsis, unspecified organism: Secondary | ICD-10-CM | POA: Diagnosis not present

## 2021-06-09 DIAGNOSIS — K819 Cholecystitis, unspecified: Secondary | ICD-10-CM | POA: Diagnosis not present

## 2021-06-28 ENCOUNTER — Other Ambulatory Visit (HOSPITAL_COMMUNITY): Payer: Self-pay

## 2021-07-03 ENCOUNTER — Other Ambulatory Visit (HOSPITAL_COMMUNITY): Payer: Self-pay

## 2021-07-06 ENCOUNTER — Other Ambulatory Visit: Payer: Self-pay

## 2021-07-06 ENCOUNTER — Other Ambulatory Visit (HOSPITAL_COMMUNITY): Payer: Self-pay | Admitting: Interventional Radiology

## 2021-07-06 ENCOUNTER — Ambulatory Visit (HOSPITAL_COMMUNITY)
Admission: RE | Admit: 2021-07-06 | Discharge: 2021-07-06 | Disposition: A | Discharge status: Home / Self Care | Payer: Medicare Other | Source: Ambulatory Visit | Attending: Interventional Radiology | Admitting: Interventional Radiology

## 2021-07-06 DIAGNOSIS — K810 Acute cholecystitis: Secondary | ICD-10-CM | POA: Insufficient documentation

## 2021-07-06 DIAGNOSIS — T85520A Displacement of bile duct prosthesis, initial encounter: Secondary | ICD-10-CM | POA: Diagnosis not present

## 2021-07-06 DIAGNOSIS — Y848 Other medical procedures as the cause of abnormal reaction of the patient, or of later complication, without mention of misadventure at the time of the procedure: Secondary | ICD-10-CM | POA: Diagnosis not present

## 2021-07-06 DIAGNOSIS — T85518A Breakdown (mechanical) of other gastrointestinal prosthetic devices, implants and grafts, initial encounter: Secondary | ICD-10-CM | POA: Diagnosis not present

## 2021-07-06 HISTORY — PX: IR EXCHANGE BILIARY DRAIN: IMG6046

## 2021-07-06 MED ORDER — LIDOCAINE HCL 1 % IJ SOLN
INTRAMUSCULAR | Status: AC
  Administered 2021-07-06: 10 mL
  Filled 2021-07-06: qty 20

## 2021-07-06 MED ORDER — IOHEXOL 300 MG/ML  SOLN
100.0000 mL | Freq: Once | INTRAMUSCULAR | Status: AC | PRN
  Administered 2021-07-06: 10 mL

## 2021-07-06 NOTE — Procedures (Signed)
Interventional Radiology Procedure:   Indications: Cholecystostomy tube is not draining and pulling out  Procedure: Cholecystostomy tube injection and exchange  Findings: Tube was still in gallbladder but tube had been pulled back.  New 10 Fr drain placed in gallbladder.  Cystic duct is NOT patent.  Complications: No immediate complications noted.     EBL: Minimal  Plan: Patient is scheduled to see General Surgery about cholecystectomy.   Sherlie Boyum R. Lowella Dandy, MD  Pager: [phone]

## 2021-07-07 DIAGNOSIS — K812 Acute cholecystitis with chronic cholecystitis: Secondary | ICD-10-CM | POA: Diagnosis not present

## 2021-07-08 ENCOUNTER — Emergency Department (HOSPITAL_COMMUNITY)
Admission: EM | Admit: 2021-07-08 | Discharge: 2021-07-08 | Disposition: A | Discharge status: Home / Self Care | Payer: Medicare Other | Attending: Emergency Medicine | Admitting: Emergency Medicine

## 2021-07-08 ENCOUNTER — Encounter (HOSPITAL_COMMUNITY): Payer: Self-pay | Admitting: Emergency Medicine

## 2021-07-08 DIAGNOSIS — T85520A Displacement of bile duct prosthesis, initial encounter: Secondary | ICD-10-CM | POA: Diagnosis not present

## 2021-07-08 DIAGNOSIS — Z7982 Long term (current) use of aspirin: Secondary | ICD-10-CM | POA: Diagnosis not present

## 2021-07-08 DIAGNOSIS — Z4803 Encounter for change or removal of drains: Secondary | ICD-10-CM | POA: Diagnosis not present

## 2021-07-08 DIAGNOSIS — Z7984 Long term (current) use of oral hypoglycemic drugs: Secondary | ICD-10-CM | POA: Insufficient documentation

## 2021-07-08 DIAGNOSIS — Z79899 Other long term (current) drug therapy: Secondary | ICD-10-CM | POA: Diagnosis not present

## 2021-07-08 NOTE — ED Triage Notes (Signed)
Pt reports having his bilary tube changed on Thursday and the tubing is too tight for him to loosen and flush.

## 2021-07-08 NOTE — Discharge Instructions (Signed)
Return if development of any new or worsening symptoms 

## 2021-07-08 NOTE — ED Provider Notes (Signed)
[facility] EMERGENCY DEPARTMENT Provider Note   CSN: [id number] Arrival date & time: 07/08/21  1140     History  No chief complaint on file.   Andrew Duncan is a 76 y.o. male.  Patient with biliary drain for cholecystitis presents today with inability to unscrew the tube to flush it.  He has been attempting to do so since Thursday without success.  The history is provided by the patient. No language interpreter was used.      Home Medications Prior to Admission medications   Medication Sig Start Date End Date Taking? Authorizing Provider  Albuterol Sulfate (PROAIR HFA IN) Inhale into the lungs. Patient not taking: Reported on 05/30/2021    [provider]  aspirin EC 81 MG tablet Take 81 mg by mouth daily.    [provider]  BD PEN NEEDLE NANO U/F 32G X 4 MM MISC USE NEEDLES FOR INJECTION TWICE A DAY 01/30/18   [provider]  cholecalciferol (VITAMIN D) 25 MCG (1000 UNIT) tablet Take 1,000 Units by mouth daily.    [provider]  glipiZIDE (GLUCOTROL XL) 10 MG 24 hr tablet Take 10 mg by mouth daily. 02/28/18   [provider]  hydrALAZINE (APRESOLINE) 25 MG tablet Take 1 tablet (25 mg total) by mouth every 8 (eight) hours. 06/05/21   Burnadette Pop, MD  JANUVIA 100 MG tablet Take 100 mg by mouth daily. 02/24/18   [provider]  LANTUS SOLOSTAR 100 UNIT/ML Solostar Pen 47 Units at bedtime. 02/14/18   [provider]  latanoprost (XALATAN) 0.005 % ophthalmic solution Place 1 drop into both eyes at bedtime. 01/31/18   [provider]  metFORMIN (GLUCOPHAGE) 500 MG tablet Take 500 mg by mouth 2 (two) times daily with a meal. 03/17/18   [provider]  metoprolol tartrate (LOPRESSOR) 25 MG tablet Take 1 tablet (25 mg total) by mouth 2 (two) times daily. 06/05/21   Burnadette Pop, MD  Multiple Vitamin (MULTIVITAMIN WITH MINERALS) TABS tablet Take 1 tablet by mouth daily.    [provider]  ondansetron (ZOFRAN) 4 MG tablet Take 1 tablet (4 mg total) by mouth every 6 (six) hours. Patient not taking: Reported on 05/30/2021 05/27/21   Couture, Cortni S, PA-C  pravastatin (PRAVACHOL) 20 MG tablet Take 20 mg by mouth daily. 02/03/18   [provider]  Semaglutide (OZEMPIC, 2 MG/DOSE, [place]) Inject 0.5 mg into the skin once a week. Every Friday    [provider]  Sodium Chloride Flush (NORMAL SALINE FLUSH) 0.9 % SOLN Inject into the vein as needed 06/05/21   Burnadette Pop, MD  timolol (TIMOPTIC) 0.5 % ophthalmic solution Place 1 drop into both eyes 2 (two) times daily. 01/07/18   [provider]  Transparent Dressings (COMPDRESS ISLAND DRESSING 4"X4) PADS 1 each by Does not apply route daily. 06/05/21   Burnadette Pop, MD      Allergies    Versed [midazolam]    Review of Systems   Review of Systems  Constitutional:  Negative for chills and fever.  Gastrointestinal:  Negative for abdominal pain, diarrhea, nausea and vomiting.  All other systems reviewed and are negative.  Physical Exam Updated Vital Signs BP (!) 154/75    Pulse 93    Temp 97.9 F (36.6 C)    Resp 20    SpO2 97%  Physical Exam Vitals and nursing note reviewed.  Constitutional:      Appearance: Normal appearance. He  is normal weight.  Cardiovascular:     Rate and Rhythm: Normal rate.  Pulmonary:     Effort: Pulmonary effort is normal.  Abdominal:     General: Abdomen is flat.     Palpations: Abdomen is soft.     Tenderness: There is no abdominal tenderness.     Comments: Upon my assessment after nurse intervention, tube is able to be unscrewed and is draining normally without any infectious signs.  No other concerns  Neurological:     Mental Status: He is alert.    ED Results / Procedures / Treatments   Labs (all labs ordered are listed, but only abnormal results are displayed) Labs Reviewed - No data to display  EKG None  Radiology IR EXCHANGE BILIARY  DRAIN  Result Date: 07/06/2021 INDICATION: 76 year old with history of acute cholecystitis. Cholecystostomy tube was placed on 06/02/2021. Patient reports no output from the drain and the drain is partially dislodged. EXAM: CHOLANGIOGRAM THROUGH EXISTING TUBE CHOLECYSTOSTOMY TUBE EXCHANGE WITH FLUOROSCOPY MEDICATIONS: None ANESTHESIA/SEDATION: None FLUOROSCOPY TIME:  Fluoroscopy Time: 1 minute, 30 seconds, 7 mGy COMPLICATIONS: None immediate. PROCEDURE: Informed written consent was obtained from the patient after a thorough discussion of the procedural risks, benefits and alternatives. All questions were addressed. Maximal Sterile Barrier Technique was utilized including caps, mask, sterile gowns, sterile gloves, sterile drape, hand hygiene and skin antiseptic. A timeout was performed prior to the initiation of the procedure. The cholecystostomy tube and surrounding skin were prepped and draped in sterile fashion. Tube was injected with contrast. The retention suture was cut. Catheter was cut and removed over a Bentson wire. A new 10 [place] multipurpose drain was advanced over the wire and reconstituted in the gallbladder. Skin around the catheter was anesthetized with 1% lidocaine. Catheter was sutured to the skin. Drain was flushed with normal saline and attached to a gravity bag. Fluoroscopic images were taken and saved for this procedure. FINDINGS: Cholecystostomy tube pigtail was still within the gallbladder lumen but a portion of the tube had been pulled out. New 10 French tube positioned within the gallbladder. Gallbladder lumen is irregular. No filling of the cystic duct. IMPRESSION: Successful exchange of the cholecystostomy tube with fluoroscopy. Electronically Signed   By: Richarda Overlie M.D.   On: 07/06/2021 17:01    Procedures Procedures    Medications Ordered in ED Medications - No data to display  ED Course/ Medical Decision Making/ A&P                           Medical Decision  Making  Patient presents today needing help unscrewing his biliary tube.  Upon my assessment after nursing intervention, tube is able to be unscrewed without difficulty and is draining normally.  Patient has no other concerns.  He is afebrile, nontoxic-appearing, and in no acute distress with reassuring vital signs and is pain-free.  Drainage site without any signs of infection.  He is stable for discharge at this time, educated on red flag symptoms of prompt immediate return.  Discharged in stable condition.   Final Clinical Impression(s) / ED Diagnoses Final diagnoses:  Biliary drain displacement, initial encounter    Rx / DC Orders ED Discharge Orders     None      An After Visit Summary was printed and given to the patient.    Vear Clock 07/08/21 1217    Derwood Kaplan, MD 07/09/21 1125

## 2021-07-11 ENCOUNTER — Other Ambulatory Visit (HOSPITAL_COMMUNITY): Payer: Self-pay

## 2021-07-11 MED ORDER — NORMAL SALINE FLUSH 0.9 % IV SOLN
INTRAVENOUS | 0 refills | Status: DC
  Filled 2021-07-11: qty 200, 20d supply, fill #0

## 2021-07-17 ENCOUNTER — Other Ambulatory Visit: Payer: Self-pay

## 2021-07-17 ENCOUNTER — Other Ambulatory Visit: Payer: Self-pay | Admitting: Radiology

## 2021-07-17 ENCOUNTER — Ambulatory Visit (HOSPITAL_COMMUNITY)
Admission: RE | Admit: 2021-07-17 | Discharge: 2021-07-17 | Disposition: A | Discharge status: Home / Self Care | Payer: Medicare Other | Source: Ambulatory Visit | Attending: Student | Admitting: Student

## 2021-07-17 ENCOUNTER — Other Ambulatory Visit (HOSPITAL_COMMUNITY): Payer: Self-pay | Admitting: Student

## 2021-07-17 DIAGNOSIS — Z434 Encounter for attention to other artificial openings of digestive tract: Secondary | ICD-10-CM | POA: Insufficient documentation

## 2021-07-17 DIAGNOSIS — K810 Acute cholecystitis: Secondary | ICD-10-CM | POA: Insufficient documentation

## 2021-07-17 DIAGNOSIS — T82520A Displacement of surgically created arteriovenous fistula, initial encounter: Secondary | ICD-10-CM | POA: Diagnosis not present

## 2021-07-17 HISTORY — PX: IR EXCHANGE BILIARY DRAIN: IMG6046

## 2021-07-17 MED ORDER — LIDOCAINE HCL 1 % IJ SOLN
INTRAMUSCULAR | Status: DC | PRN
  Administered 2021-07-17: 5 mL via INTRADERMAL

## 2021-07-17 MED ORDER — LIDOCAINE HCL 1 % IJ SOLN
INTRAMUSCULAR | Status: AC
  Filled 2021-07-17: qty 20

## 2021-07-17 MED ORDER — IOHEXOL 300 MG/ML  SOLN
50.0000 mL | Freq: Once | INTRAMUSCULAR | Status: AC | PRN
  Administered 2021-07-17: 10 mL

## 2021-07-19 ENCOUNTER — Encounter: Payer: Self-pay | Admitting: Cardiovascular Disease

## 2021-07-19 ENCOUNTER — Other Ambulatory Visit: Payer: Self-pay

## 2021-07-19 ENCOUNTER — Ambulatory Visit (INDEPENDENT_AMBULATORY_CARE_PROVIDER_SITE_OTHER): Payer: Medicare Other | Admitting: Cardiovascular Disease

## 2021-07-19 VITALS — BP 126/68 | HR 92 | Ht 65.0 in | Wt 187.2 lb

## 2021-07-19 DIAGNOSIS — I10 Essential (primary) hypertension: Secondary | ICD-10-CM

## 2021-07-19 DIAGNOSIS — I441 Atrioventricular block, second degree: Secondary | ICD-10-CM | POA: Diagnosis not present

## 2021-07-19 DIAGNOSIS — Z794 Long term (current) use of insulin: Secondary | ICD-10-CM | POA: Diagnosis not present

## 2021-07-19 DIAGNOSIS — K811 Chronic cholecystitis: Secondary | ICD-10-CM | POA: Diagnosis not present

## 2021-07-19 DIAGNOSIS — E119 Type 2 diabetes mellitus without complications: Secondary | ICD-10-CM | POA: Diagnosis not present

## 2021-07-19 DIAGNOSIS — E7800 Pure hypercholesterolemia, unspecified: Secondary | ICD-10-CM | POA: Diagnosis not present

## 2021-07-19 NOTE — Patient Instructions (Signed)
Medication Instructions:  No changes *If you need a refill on your cardiac medications before your next appointment, please call your pharmacy*   Lab Work: None ordered If you have labs (blood work) drawn today and your tests are completely normal, you will receive your results only by: MyChart Message (if you have MyChart) OR A paper copy in the mail If you have any lab test that is abnormal or we need to change your treatment, we will call you to review the results.   Testing/Procedures: None ordered   Follow-Up: At [facility], you and your health needs are our priority.  As part of our continuing mission to provide you with exceptional heart care, we have created designated Provider Care Teams.  These Care Teams include your primary Cardiologist (physician) and Advanced Practice Providers (APPs -  Physician Assistants and Nurse Practitioners) who all work together to provide you with the care you need, when you need it.  We recommend signing up for the patient portal called "MyChart".  Sign up information is provided on this After Visit Summary.  MyChart is used to connect with patients for Virtual Visits (Telemedicine).  Patients are able to view lab/test results, encounter notes, upcoming appointments, etc.  Non-urgent messages can be sent to your provider as well.   To learn more about what you can do with MyChart, go to [email or web address].    Your next appointment:   Follow up as needed with Dr. Croitoru  

## 2021-07-19 NOTE — Progress Notes (Signed)
Cardiology Office Note:    Date:  07/20/2021   ID:  Andrew Meekeravid L Dunigan, DOB 20-Dec-1945, MRN [id number]  PCP:  Renford DillsPolite, Ronald, MD   [facility] Providers Cardiologist:  None     Referring MD: Renford DillsPolite, Ronald, MD   Chief Complaint  Patient presents with   Advice Only       Andrew Duncan is a 76 y.o. male who is being seen today for the evaluation of second-degree atrioventricular block Mobitz, at the request of Renford DillsPolite, Ronald, MD.   History of Present Illness:    Andrew Duncan is a 76 y.o. 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  At that time, the patient was being evaluated for nausea, vomiting and abdominal pain that been going on for about 3 days. A CT of the abdomen performed at that time showed evidence of cholelithiasis but did not raise concern for cholecystitis.  The patient was discharged, but he returned 3 days later on 05/30/2021 with sepsis respiratory distress and was hospitalized.  He had a right-sided pneumonia as well as ongoing abnormalities of the gallbladder.  Eventually he developed a liver abscess and had a percutaneous cholecystostomy drain placed.  This is still in place at this time.  He is scheduled for his follow-up with interventional radiology from February 27 since he has ongoing evidence of cystic duct obstruction.  He has seen Dr. Marin Olphristopher White in clinic and has discussed possible need for surgical cholecystectomy if the cystic duct is still obstructed.  This would be complicated by the fact that his gallbladder perforated into his liver.  His ECG on 05/27/2021 showed sinus rhythm with a single dropped P wave, but was otherwise a normal tracing.  The interpretation was that this was Mobitz type II AV block, but on my review there is clear PR  interval prolongation on the following cycle suggesting this was actually the result of a very long Wenckebach cycle (Mobitz type I second-degree AV block).  He was not bradycardic, in fact the overall heart rate was 87 bpm.  Another EKG was not performed during his hospital stay, but review of the cardiac monitoring strips shows sinus rhythm.  There is no evidence of second-degree heart block or any other abnormalities.  He is much better and is not currently experiencing abdominal pain, nausea, fever.  He also denies any cardiac symptoms and specifically denies shortness of breath or chest pain either at rest or with activity, orthopnea, PND, lower extremity edema.  He does not experience palpitations.  He has never had syncope.  He does not report intermittent claudication.  Review of CT scans performed during his initial evaluation and subsequent hospitalization shows the absence of any meaningful sclerotic calcification in the distribution of the coronary arteries, thoracic aorta or its major branches.  The abdominal CT shows a single tiny speck of atherosclerotic calcification in the abdominal aorta and no evidence of disease in the branch vessels or iliac arteries.    His most recent hemoglobin A1c was suboptimal at 8.6% (but this of course includes the period of his acute infection in November and early December).  His most recent lipid profile from October 2002 shows an excellent HDL at 62 but an LDL that is also above target at 106.  He has normal renal function.  He does  not smoke.   Past Medical History:  Diagnosis Date   Anemia    Childhood asthma    Diabetes (HCC)    Essential hypertension    Hypercholesterolemia    Hyperlipidemia    Polyneuropathy    Retinopathy     Past Surgical History:  Procedure Laterality Date   ANAL FISSURE REPAIR     IR EXCHANGE BILIARY DRAIN  07/06/2021   IR EXCHANGE BILIARY DRAIN  07/17/2021   IR PERC CHOLECYSTOSTOMY  06/02/2021   left elbow fracture  pinning     pinning    Current Medications: Current Meds  Medication Sig   aspirin EC 81 MG tablet Take 81 mg by mouth daily.   BD PEN NEEDLE NANO U/F 32G X 4 MM MISC USE NEEDLES FOR INJECTION TWICE A DAY   cholecalciferol (VITAMIN D) 25 MCG (1000 UNIT) tablet Take 1,000 Units by mouth daily.   glipiZIDE (GLUCOTROL XL) 10 MG 24 hr tablet Take 10 mg by mouth daily.   JANUVIA 100 MG tablet Take 100 mg by mouth daily.   LANTUS SOLOSTAR 100 UNIT/ML Solostar Pen 47 Units at bedtime.   latanoprost (XALATAN) 0.005 % ophthalmic solution Place 1 drop into both eyes at bedtime.   metFORMIN (GLUCOPHAGE) 500 MG tablet Take 500 mg by mouth 2 (two) times daily with a meal.   Multiple Vitamin (MULTIVITAMIN WITH MINERALS) TABS tablet Take 1 tablet by mouth daily.   pravastatin (PRAVACHOL) 20 MG tablet Take 20 mg by mouth daily.   Semaglutide (OZEMPIC, 2 MG/DOSE, [place]) Inject 0.5 mg into the skin once a week. Every Friday   sildenafil (VIAGRA) 100 MG tablet Take 100 mg by mouth daily as needed.   Sodium Chloride Flush (NORMAL SALINE FLUSH) 0.9 % SOLN Use to flush drain with 5 mls once daily   timolol (TIMOPTIC) 0.5 % ophthalmic solution Place 1 drop into both eyes 2 (two) times daily.   Transparent Dressings (COMPDRESS ISLAND DRESSING 4"X4) PADS 1 each by Does not apply route daily.     Allergies:   Versed [midazolam]   Social History   Socioeconomic History   Marital status: Married    Spouse name: Not on file   Number of children: 2   Years of education: Not on file   Highest education level: Doctorate  Occupational History   Occupation: Secondary school teacher at [employer]  Tobacco Use   Smoking status: Never   Smokeless tobacco: Never  Vaping Use   Vaping Use: Never used  Substance and Sexual Activity   Alcohol use: Not Currently   Drug use: Never   Sexual activity: Not on file  Other Topics Concern   Not on file  Social History Narrative   Lives with wife in a 2 story home.  Has 2 children.  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  Education: PhD.   Social Determinants of Health   Financial Resource Strain: Not on file  Food Insecurity: Not on file  Transportation Needs: Not on file  Physical Activity: Not on file  Stress: Not on file  Social Connections: Not on file     Family History: The patient's family history includes Diabetes in his mother; Stroke in his mother.  ROS:   Please see the history of present illness.     All other systems reviewed and are negative.  EKGs/Labs/Other Studies Reviewed:    The following studies were reviewed today: Extensive review of imaging studies performed during November and December 2022  EKG:  EKG is  ordered today.  The ekg ordered today demonstrates normal sinus rhythm, borderline LVH voltage criteria, no repolarization abnormalities, otherwise completely normal tracing.  The PR interval is normal at 154 ms.  The QRS is narrow.  QTc 447 ms  Recent Labs: 06/05/2021: ALT 168; BUN 13; Creatinine, Ser 0.82; Hemoglobin 11.1; Magnesium 2.2; Platelets 373; Potassium 3.7; Sodium 138  Recent Lipid Panel No results found for: CHOL, TRIG, HDL, CHOLHDL, VLDL, LDLCALC, LDLDIRECT 04/21/2021 Cholesterol 192, HDL 62, LDL 106, triglycerides 73  06/01/2021 Hemoglobin A1c 8.6%  Risk Assessment/Calculations:           Physical Exam:    VS:  BP 126/68 (BP Location: Left Arm, Patient Position: Sitting)    Pulse 92    Ht 5\' 5"  (1.651 m)    Wt 187 lb 3.2 oz (84.9 kg)    SpO2 97%    BMI 31.15 kg/m     Wt Readings from Last 3 Encounters:  07/19/21 187 lb 3.2 oz (84.9 kg)  05/30/21 178 lb 4.8 oz (80.9 kg)  05/08/18 181 lb 2 oz (82.2 kg)     GEN: Mildly obese, well nourished, well developed in no acute distress HEENT: Normal NECK: No JVD; No carotid bruits LYMPHATICS: No lymphadenopathy CARDIAC: RRR, no murmurs, rubs, gallops RESPIRATORY:  Clear to auscultation without rales, wheezing or rhonchi  ABDOMEN: Soft, non-tender, non-distended.  Clean dressing  with cholecystostomy tube in place MUSCULOSKELETAL:  No edema; No deformity  SKIN: Warm and dry NEUROLOGIC:  Alert and oriented x 3 PSYCHIATRIC:  Normal affect   ASSESSMENT:    1. Second degree Mobitz I AV block   2. Essential hypertension   3. Hypercholesterolemia   4. Type 2 diabetes mellitus without complication, with long-term current use of insulin (HCC)   5. Cholecystitis, chronic    PLAN:    In order of problems listed above:  Second-degree atrioventricular block: I think he had Mobitz type I AV block which is likely physiological in the setting of nausea, vomiting, abdominal pain due to heightened vagal tone.  He has narrow QRS complex on all his ECG tracings and on monitor strips.  He has never experienced symptoms of bradycardia or syncope.  He does not require additional investigation or treatment for this disorder. Hypercholesterolemia: In view of the diagnosis of diabetes mellitus, target LDL less than 100.  His LDL is a little bit higher than that.  Consider increasing the pravastatin 40 mg daily. DM: Not well controlled and complicated by neuropathy and retinopathy.  It is likely that glycemic control has deteriorated recently due to his sepsis.  Despite his diabetes/hypertension/hypercholesterolemia, there is remarkably little evidence of any large vessel atherosclerosis on his imaging studies.  Low suspicion for significant CAD. HTN: Well-controlled. 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            Medication Adjustments/Labs and Tests Ordered: Current medicines are reviewed at length with the patient today.  Concerns regarding medicines are outlined above.  Orders Placed This Encounter  Procedures   EKG 12-Lead   No orders of the defined types were placed in this  encounter.   Patient Instructions  Medication Instructions:  No changes *If you need a refill on your cardiac medications before your next appointment, please call your pharmacy*   Lab Work: None ordered If you have labs (  blood work) drawn today and your tests are completely normal, you will receive your results only by: MyChart Message (if you have MyChart) OR A paper copy in the mail If you have any lab test that is abnormal or we need to change your treatment, we will call you to review the results.   Testing/Procedures: None ordered   Follow-Up: At [facility], you and your health needs are our priority.  As part of our continuing mission to provide you with exceptional heart care, we have created designated Provider Care Teams.  These Care Teams include your primary Cardiologist (physician) and Advanced Practice Providers (APPs -  Physician Assistants and Nurse Practitioners) who all work together to provide you with the care you need, when you need it.  We recommend signing up for the patient portal called "MyChart".  Sign up information is provided on this After Visit Summary.  MyChart is used to connect with patients for Virtual Visits (Telemedicine).  Patients are able to view lab/test results, encounter notes, upcoming appointments, etc.  Non-urgent messages can be sent to your provider as well.   To learn more about what you can do with MyChart, go to [email or web address].    Your next appointment:   Follow up as needed with Dr. Royann Shivers   Signed, Thurmon Fair, MD  07/20/2021 10:45 AM    [facility]

## 2021-07-20 ENCOUNTER — Other Ambulatory Visit: Payer: Self-pay | Admitting: Radiology

## 2021-07-20 ENCOUNTER — Other Ambulatory Visit (HOSPITAL_COMMUNITY): Payer: Self-pay | Admitting: Surgery

## 2021-07-20 DIAGNOSIS — K810 Acute cholecystitis: Secondary | ICD-10-CM

## 2021-07-21 ENCOUNTER — Ambulatory Visit (HOSPITAL_COMMUNITY)
Admission: RE | Admit: 2021-07-21 | Discharge: 2021-07-21 | Disposition: A | Discharge status: Home / Self Care | Payer: Medicare Other | Source: Ambulatory Visit | Attending: Surgery | Admitting: Surgery

## 2021-07-21 ENCOUNTER — Other Ambulatory Visit (HOSPITAL_COMMUNITY): Payer: Self-pay | Admitting: Surgery

## 2021-07-21 ENCOUNTER — Other Ambulatory Visit: Payer: Self-pay

## 2021-07-21 DIAGNOSIS — J90 Pleural effusion, not elsewhere classified: Secondary | ICD-10-CM | POA: Diagnosis not present

## 2021-07-21 DIAGNOSIS — K810 Acute cholecystitis: Secondary | ICD-10-CM

## 2021-07-21 DIAGNOSIS — Z434 Encounter for attention to other artificial openings of digestive tract: Secondary | ICD-10-CM | POA: Insufficient documentation

## 2021-07-21 DIAGNOSIS — E1142 Type 2 diabetes mellitus with diabetic polyneuropathy: Secondary | ICD-10-CM | POA: Diagnosis not present

## 2021-07-21 DIAGNOSIS — E11319 Type 2 diabetes mellitus with unspecified diabetic retinopathy without macular edema: Secondary | ICD-10-CM | POA: Diagnosis not present

## 2021-07-21 DIAGNOSIS — E1165 Type 2 diabetes mellitus with hyperglycemia: Secondary | ICD-10-CM | POA: Diagnosis not present

## 2021-07-21 DIAGNOSIS — I700 Atherosclerosis of aorta: Secondary | ICD-10-CM | POA: Diagnosis not present

## 2021-07-21 DIAGNOSIS — I10 Essential (primary) hypertension: Secondary | ICD-10-CM | POA: Diagnosis not present

## 2021-07-21 DIAGNOSIS — Z794 Long term (current) use of insulin: Secondary | ICD-10-CM | POA: Diagnosis not present

## 2021-07-21 DIAGNOSIS — K811 Chronic cholecystitis: Secondary | ICD-10-CM | POA: Diagnosis not present

## 2021-07-21 DIAGNOSIS — E7800 Pure hypercholesterolemia, unspecified: Secondary | ICD-10-CM | POA: Diagnosis not present

## 2021-07-21 HISTORY — PX: IR CHOLANGIOGRAM EXISTING TUBE: IMG6040

## 2021-07-21 HISTORY — PX: IR CT SPINE LTD: IMG2387

## 2021-07-21 MED ORDER — IOHEXOL 300 MG/ML  SOLN
100.0000 mL | Freq: Once | INTRAMUSCULAR | Status: AC | PRN
  Administered 2021-07-21: 10 mL

## 2021-07-21 MED ORDER — LIDOCAINE HCL 1 % IJ SOLN
INTRAMUSCULAR | Status: AC
  Filled 2021-07-21: qty 20

## 2021-07-21 NOTE — Procedures (Signed)
Interventional Radiology Procedure Note  Procedure: CHOLECYSTOSTOMY TUBE INJECTION    Complications: None  Estimated Blood Loss:  0  Findings: TUBE IS PATENT AND WITHIN A COLLAPSED OBSTRUCTED GB    Sharen Counter, MD

## 2021-07-23 ENCOUNTER — Other Ambulatory Visit: Payer: Self-pay

## 2021-07-23 ENCOUNTER — Encounter (HOSPITAL_COMMUNITY): Payer: Self-pay | Admitting: *Deleted

## 2021-07-23 ENCOUNTER — Emergency Department (HOSPITAL_COMMUNITY)
Admission: EM | Admit: 2021-07-23 | Discharge: 2021-07-23 | Disposition: A | Discharge status: Home / Self Care | Payer: Medicare Other | Attending: Student | Admitting: Student

## 2021-07-23 DIAGNOSIS — Z7984 Long term (current) use of oral hypoglycemic drugs: Secondary | ICD-10-CM | POA: Diagnosis not present

## 2021-07-23 DIAGNOSIS — Z4803 Encounter for change or removal of drains: Secondary | ICD-10-CM | POA: Diagnosis not present

## 2021-07-23 DIAGNOSIS — I10 Essential (primary) hypertension: Secondary | ICD-10-CM | POA: Diagnosis not present

## 2021-07-23 DIAGNOSIS — E119 Type 2 diabetes mellitus without complications: Secondary | ICD-10-CM | POA: Insufficient documentation

## 2021-07-23 DIAGNOSIS — Z4801 Encounter for change or removal of surgical wound dressing: Secondary | ICD-10-CM | POA: Diagnosis not present

## 2021-07-23 DIAGNOSIS — Z79899 Other long term (current) drug therapy: Secondary | ICD-10-CM | POA: Diagnosis not present

## 2021-07-23 DIAGNOSIS — Z7982 Long term (current) use of aspirin: Secondary | ICD-10-CM | POA: Insufficient documentation

## 2021-07-23 NOTE — Discharge Instructions (Signed)
Continue caring for your drain as directed.  If you have any additional issues with your drain or connections call drain clinic on Monday morning.

## 2021-07-23 NOTE — ED Triage Notes (Signed)
The pt is here because his drain that was replaced  this past Friday  he has gallbladder  infection and needs surgery  the home health nurse came out this am but could not get the clamp open so the drain could drain

## 2021-07-23 NOTE — ED Provider Notes (Signed)
[facility] EMERGENCY DEPARTMENT Provider Note   CSN: [id number] Arrival date & time: 07/23/21  1501     History  No chief complaint on file.   Andrew Duncan is a 76 y.o. male.  Andrew Duncan is a 76 y.o. male with history of biliary drain placement as well as hypertension, hyperlipidemia and diabetes mellitus, who presents to the emergency department due to issues with his biliary drain.  She reports that they were placed this on Friday and when they screwed to the tubing back onto the drain site they tightened it so much that he has been unable to unscrew the tubing to flush the drain line as he typically does.  He reports that this is happened once before and he is able to follow-up at the drain clinic and they were able to fix this but they were closed today.  He denies any pain, fevers or other complaints.  The history is provided by the patient.      Home Medications Prior to Admission medications   Medication Sig Start Date End Date Taking? Authorizing Provider  aspirin EC 81 MG tablet Take 81 mg by mouth daily.    [provider]  BD PEN NEEDLE NANO U/F 32G X 4 MM MISC USE NEEDLES FOR INJECTION TWICE A DAY 01/30/18   [provider]  cholecalciferol (VITAMIN D) 25 MCG (1000 UNIT) tablet Take 1,000 Units by mouth daily.    [provider]  glipiZIDE (GLUCOTROL XL) 10 MG 24 hr tablet Take 10 mg by mouth daily. 02/28/18   [provider]  JANUVIA 100 MG tablet Take 100 mg by mouth daily. 02/24/18   [provider]  LANTUS SOLOSTAR 100 UNIT/ML Solostar Pen 47 Units at bedtime. 02/14/18   [provider]  latanoprost (XALATAN) 0.005 % ophthalmic solution Place 1 drop into both eyes at bedtime. 01/31/18   [provider]  metFORMIN (GLUCOPHAGE) 500 MG tablet Take 500 mg by mouth 2 (two) times daily with a meal. 03/17/18   [provider]  Multiple Vitamin (MULTIVITAMIN WITH MINERALS) TABS tablet Take  1 tablet by mouth daily.    [provider]  pravastatin (PRAVACHOL) 20 MG tablet Take 20 mg by mouth daily. 02/03/18   [provider]  Semaglutide (OZEMPIC, 2 MG/DOSE, [place]) Inject 0.5 mg into the skin once a week. Every Friday    [provider]  sildenafil (VIAGRA) 100 MG tablet Take 100 mg by mouth daily as needed.    [provider]  Sodium Chloride Flush (NORMAL SALINE FLUSH) 0.9 % SOLN Use to flush drain with 5 mls once daily 07/11/21   Richarda Overlie, MD  timolol (TIMOPTIC) 0.5 % ophthalmic solution Place 1 drop into both eyes 2 (two) times daily. 01/07/18   [provider]  Transparent Dressings (COMPDRESS [place] DRESSING 4"X4) PADS 1 each by Does not apply route daily. 06/05/21   Burnadette Pop, MD      Allergies    Versed [midazolam]    Review of Systems   Review of Systems  All other systems reviewed and are negative.  Physical Exam Updated Vital Signs BP (!) 142/76    Pulse 90    Temp 98 F (36.7 C)    Resp 14    Ht 5\' 5"  (1.651 m)    Wt 84.9 kg    SpO2 97%    BMI 31.15 kg/m  Physical Exam Vitals and nursing note reviewed.  Constitutional:  General: He is not in acute distress.    Appearance: Normal appearance. He is well-developed. He is not ill-appearing or diaphoretic.  HENT:     Head: Normocephalic and atraumatic.  Eyes:     General:        Right eye: No discharge.        Left eye: No discharge.  Pulmonary:     Effort: Pulmonary effort is normal. No respiratory distress.  Abdominal:     Comments: Biliary drain present in the right upper quadrant.  There is a Luer-Lok connection between drain and additional tubing and drain bag that is very tight and difficult to loosen.  Drain is intact without tenderness or surrounding erythema.  Skin:    General: Skin is warm and dry.  Neurological:     Mental Status: He is alert and oriented to person, place, and time.     Coordination: Coordination normal.  Psychiatric:        Mood  and Affect: Mood normal.        Behavior: Behavior normal.    ED Results / Procedures / Treatments   Labs (all labs ordered are listed, but only abnormal results are displayed) Labs Reviewed - No data to display  EKG None  Radiology No results found.  Procedures Procedures    Medications Ordered in ED Medications - No data to display  ED Course/ Medical Decision Making/ A&P                           Medical Decision Making Problems Addressed: Encounter for change or removal of drains: acute illness or injury that poses a threat to life or bodily functions    Details: If drain issue is not corrected and patient is unable to flush drain could lead to worsening biliary issues or infection  Amount and/or Complexity of Data Reviewed Independent Historian: parent External Data Reviewed: notes.   Patient unable to disconnect tubing from his biliary drain to flush line as the Luer-Lok was screwed too tight.  We were able to loosen this with hemostats and then patient was able to screw and unscrew the drain tubing with ease, provided saline flush to flush his line.  No further intervention needed at this time.  Discharged home in good condition.  Encouraged to follow-up with IR drain clinic for any additional issues.        Final Clinical Impression(s) / ED Diagnoses Final diagnoses:  Encounter for change or removal of drains    Rx / DC Orders ED Discharge Orders     None         Legrand Rams 07/24/21 0136    Glendora Score, MD 07/24/21 1958

## 2021-07-23 NOTE — ED Provider Triage Note (Addendum)
Emergency Medicine Provider Triage Evaluation Note  Andrew Duncan , a 76 y.o. male  was evaluated in triage.  Pt complains of  inability to flush gallbladder drain.  He has a bag attached that he can't get the connector off to flush the bag.  He hasn't flushed the tube since it was placed two days ago.   Review of Systems  Positive:  Negative: See above.  Physical Exam  BP 136/69 (BP Location: Right Arm)    Pulse 96    Temp 98.1 F (36.7 C) (Oral)    Resp 16    Ht 5\' 5"  (1.651 m)    Wt 84.9 kg    SpO2 98%    BMI 31.15 kg/m  Gen:   Awake, no distress   Resp:  Normal effort  MSK:   Moves extremities without difficulty  Other:  Normal speech.   Medical Decision Making  Medically screening exam initiated at 3:31 PM.  Appropriate orders placed.  was informed that the remainder of the evaluation will be completed by another provider, this initial triage assessment does not replace that evaluation, and the importance of remaining in the ED until their evaluation is complete.  I attempted to undo patient's drain at the connection, however it did not move.  Note: Portions of this report may have been transcribed using voice recognition software. Every effort was made to ensure accuracy; however, inadvertent computerized transcription errors may be present.     Ninfa Meeker, PA-C 07/23/21 1534    07/25/21, Cristina Gong 07/23/21 1538

## 2021-07-24 ENCOUNTER — Other Ambulatory Visit (HOSPITAL_COMMUNITY): Payer: Medicare Other

## 2021-07-26 ENCOUNTER — Ambulatory Visit: Payer: Self-pay | Admitting: Surgery

## 2021-07-26 DIAGNOSIS — Z434 Encounter for attention to other artificial openings of digestive tract: Secondary | ICD-10-CM | POA: Diagnosis not present

## 2021-07-26 DIAGNOSIS — K819 Cholecystitis, unspecified: Secondary | ICD-10-CM | POA: Diagnosis not present

## 2021-07-26 NOTE — H&P (Signed)
History of Present Illness: Andrew Duncan is a 76 y.o. male who is seen today for follow-up of cholecystitis.  He was admitted in early December 2022 with acute cholecystitis with recent liver abscess presumably secondary to perforation, as well as a concurrent pneumonia.  He had a percutaneous cholecystostomy placed during that admission which remained in place.  He has since recovered from his pneumonia and saw Dr. Cliffton Asters in clinic several weeks ago for follow-up. Five days ago he had a cholangiogram via the cholecystostomy tube, which showed persistent occlusion of the cystic duct.  He denies any abdominal pain, nausea, or vomiting.  He has recovered from his pneumonia and has no fevers or shortness of breath.   RUQ [place] at admission showed sludge in the gallbladder but no stones.  His transaminases were mildly elevated during his hospitalization.  He recently had follow-up and labs with his PCP, and LFTs were normal at that time. He has never had any prior abdominal surgeries and is not on any blood thinners.     Review of Systems: A complete review of systems was obtained from the patient.  I have reviewed this information and discussed as appropriate with the patient.  See HPI as well for other ROS.       Medical History: Past Medical History Past Medical History: Diagnosis Date  Anemia    Asthma, unspecified asthma severity, unspecified whether complicated, unspecified whether persistent    Diabetes mellitus without complication (CMS-HCC)    Glaucoma (increased eye pressure)    Hyperlipidemia    Hypertension        Patient Active Problem List Diagnosis  Cholecystitis     Past Surgical History History reviewed. No pertinent surgical history.     Allergies Allergies Allergen Reactions  Dapagliflozin Other (See Comments)      Current Outpatient Medications on File Prior to Visit Medication Sig Dispense Refill  aspirin 81 MG chewable tablet Take by mouth      glipiZIDE  (GLUCOTROL XL) 10 MG XL tablet Take 10 mg by mouth once daily      insulin GLARGINE (LANTUS U-100 INSULIN) injection (concentration 100 units/mL) Inject subcutaneously      JANUVIA 100 mg tablet Take 100 mg by mouth once daily      latanoprost (XALATAN) 0.005 % ophthalmic solution APPLY 1 DROP IN BOTH EYES EVERY NIGHT      losartan (COZAAR) 25 MG tablet Take 25 mg by mouth once daily      metFORMIN (GLUCOPHAGE) 500 MG tablet 2 tablets      metoprolol tartrate (LOPRESSOR) 25 MG tablet 1 tablet with food      semaglutide (OZEMPIC) 0.25 mg or 0.5 mg(2 mg/1.5 mL) pen injector 0.5 mg       No current facility-administered medications on file prior to visit.     Family History Family History Problem Relation Age of Onset  Hyperlipidemia (Elevated cholesterol) Mother    Stroke Mother    Skin cancer Mother    High blood pressure (Hypertension) Mother    Coronary Artery Disease (Blocked arteries around heart) Mother    Diabetes Mother    Hyperlipidemia (Elevated cholesterol) Father    Diabetes Father    Diabetes Sister    Breast cancer Brother    Diabetes Brother        Social History   Tobacco Use Smoking Status Never Smokeless Tobacco Never     Social History Social History    Socioeconomic History  Marital status: Married Tobacco Use  Smoking status: Never  Smokeless tobacco: Never Vaping Use  Vaping Use: Never used Substance and Sexual Activity  Alcohol use: Not Currently  Drug use: Never      Objective:     Vitals:   07/26/21 1053 BP: 132/76 Pulse: 88 Temp: 36.5 C (97.7 F) SpO2: 99% Weight: 84.8 kg (187 lb)   Body mass index is 31.12 kg/m.   Physical Exam Vitals reviewed.  Constitutional:      General: He is not in acute distress.    Appearance: Normal appearance.  HENT:     Head: Normocephalic and atraumatic.  Eyes:     General: No scleral icterus.    Conjunctiva/sclera: Conjunctivae normal.  Pulmonary:     Effort: Pulmonary effort is  normal. No respiratory distress.  Abdominal:     General: There is no distension.     Palpations: Abdomen is soft.     Tenderness: There is no abdominal tenderness.     Comments: Drain in RUQ has scant cloudy brown drainage, nonbilious.  No surgical scars.  Musculoskeletal:        General: No swelling or deformity. Normal range of motion.     Cervical back: Normal range of motion.  Skin:    General: Skin is warm and dry.  Neurological:     General: No focal deficit present.     Mental Status: He is alert and oriented to person, place, and time.  Psychiatric:        Mood and Affect: Mood normal.        Behavior: Behavior normal.        Thought Content: Thought content normal.        Labs, Imaging and Diagnostic Testing:   Cholangiogram 07/21/21: IMPRESSION: Cholecystostomy catheter within a collapsed obstructive gallbladder. Catheter remains patent.     Assessment and Plan:    Diagnoses and all orders for this visit:   Cholecystitis   Cholecystostomy care (CMS-HCC)       This is a 76 yo male who presented with acute cholecystitis with concurrent pneumonia, from which he has now recovered. I reviewed his imaging including his CT scan, RUQ [place], and cholangiogram. The presence of abscesses adjacent to the gallbladder in the setting of cholecystitis indicate he likely had perforation into the cystic plate. Recent tube cholangiogram indicates that his cystic duct is still occluded. Interestingly he does not have any visible gallstones on any of his imaging, however he clearly had cholecystitis and still has cystic duct occlusion, so I recommended proceeding with cholecystectomy. I will plan for a laparoscopic approach but discussed there is increased possibility for conversion to an open procedure depending on the degree of adhesions and chronic inflammation. I discussed the details of the procedure and the benefits and risks including risk of bile duct injury. He agrees to proceed  with surgery and will be contacted to schedule an elective surgery date. His cholecystostomy should remain to gravity drainage.  Sophronia Simas, MD [facility] Surgery General, Hepatobiliary and Pancreatic Surgery 07/26/21 1:49 PM

## 2021-07-26 NOTE — H&P (View-Only) (Signed)
History of Present Illness: Casanova Schurman is a 76 y.o. male who is seen today for follow-up of cholecystitis.  He was admitted in early December 2022 with acute cholecystitis with recent liver abscess presumably secondary to perforation, as well as a concurrent pneumonia.  He had a percutaneous cholecystostomy placed during that admission which remained in place.  He has since recovered from his pneumonia and saw Dr. Cliffton Asters in clinic several weeks ago for follow-up. Five days ago he had a cholangiogram via the cholecystostomy tube, which showed persistent occlusion of the cystic duct.  He denies any abdominal pain, nausea, or vomiting.  He has recovered from his pneumonia and has no fevers or shortness of breath.   RUQ [place] at admission showed sludge in the gallbladder but no stones.  His transaminases were mildly elevated during his hospitalization.  He recently had follow-up and labs with his PCP, and LFTs were normal at that time. He has never had any prior abdominal surgeries and is not on any blood thinners.     Review of Systems: A complete review of systems was obtained from the patient.  I have reviewed this information and discussed as appropriate with the patient.  See HPI as well for other ROS.       Medical History: Past Medical History Past Medical History: Diagnosis Date  Anemia    Asthma, unspecified asthma severity, unspecified whether complicated, unspecified whether persistent    Diabetes mellitus without complication (CMS-HCC)    Glaucoma (increased eye pressure)    Hyperlipidemia    Hypertension        Patient Active Problem List Diagnosis  Cholecystitis     Past Surgical History History reviewed. No pertinent surgical history.     Allergies Allergies Allergen Reactions  Dapagliflozin Other (See Comments)      Current Outpatient Medications on File Prior to Visit Medication Sig Dispense Refill  aspirin 81 MG chewable tablet Take by mouth      glipiZIDE  (GLUCOTROL XL) 10 MG XL tablet Take 10 mg by mouth once daily      insulin GLARGINE (LANTUS U-100 INSULIN) injection (concentration 100 units/mL) Inject subcutaneously      JANUVIA 100 mg tablet Take 100 mg by mouth once daily      latanoprost (XALATAN) 0.005 % ophthalmic solution APPLY 1 DROP IN BOTH EYES EVERY NIGHT      losartan (COZAAR) 25 MG tablet Take 25 mg by mouth once daily      metFORMIN (GLUCOPHAGE) 500 MG tablet 2 tablets      metoprolol tartrate (LOPRESSOR) 25 MG tablet 1 tablet with food      semaglutide (OZEMPIC) 0.25 mg or 0.5 mg(2 mg/1.5 mL) pen injector 0.5 mg       No current facility-administered medications on file prior to visit.     Family History Family History Problem Relation Age of Onset  Hyperlipidemia (Elevated cholesterol) Mother    Stroke Mother    Skin cancer Mother    High blood pressure (Hypertension) Mother    Coronary Artery Disease (Blocked arteries around heart) Mother    Diabetes Mother    Hyperlipidemia (Elevated cholesterol) Father    Diabetes Father    Diabetes Sister    Breast cancer Brother    Diabetes Brother        Social History   Tobacco Use Smoking Status Never Smokeless Tobacco Never     Social History Social History    Socioeconomic History  Marital status: Married Tobacco Use  Smoking status: Never ° Smokeless tobacco: Never °Vaping Use ° Vaping Use: Never used °Substance and Sexual Activity ° Alcohol use: Not Currently ° Drug use: Never ° °  °  °Objective: °  °  °Vitals: °  07/26/21 1053 °BP: 132/76 °Pulse: 88 °Temp: 36.5 °C (97.7 °F) °SpO2: 99% °Weight: 84.8 kg (187 lb) °  °Body mass index is 31.12 kg/m². °  °Physical Exam °Vitals reviewed.  °Constitutional:   °   General: He is not in acute distress. °   Appearance: Normal appearance.  °HENT:  °   Head: Normocephalic and atraumatic.  °Eyes:  °   General: No scleral icterus. °   Conjunctiva/sclera: Conjunctivae normal.  °Pulmonary:  °   Effort: Pulmonary effort is  normal. No respiratory distress.  °Abdominal:  °   General: There is no distension.  °   Palpations: Abdomen is soft.  °   Tenderness: There is no abdominal tenderness.  °   Comments: Drain in RUQ has scant cloudy brown drainage, nonbilious.  No surgical scars.  °Musculoskeletal:     °   General: No swelling or deformity. Normal range of motion.  °   Cervical back: Normal range of motion.  °Skin: °   General: Skin is warm and dry.  °Neurological:  °   General: No focal deficit present.  °   Mental Status: He is alert and oriented to person, place, and time.  °Psychiatric:     °   Mood and Affect: Mood normal.     °   Behavior: Behavior normal.     °   Thought Content: Thought content normal.  °  °  °  °Labs, Imaging and Diagnostic Testing: °  °Cholangiogram 07/21/21: °IMPRESSION: °Cholecystostomy catheter within a collapsed obstructive gallbladder. °Catheter remains patent. °  °  °Assessment and Plan: °   °Diagnoses and all orders for this visit: °  °Cholecystitis °  °Cholecystostomy care (CMS-HCC) °  °  °  °This is a 76 yo male who presented with acute cholecystitis with concurrent pneumonia, from which he has now recovered. I reviewed his imaging including his CT scan, RUQ US, and cholangiogram. The presence of abscesses adjacent to the gallbladder in the setting of cholecystitis indicate he likely had perforation into the cystic plate. Recent tube cholangiogram indicates that his cystic duct is still occluded. Interestingly he does not have any visible gallstones on any of his imaging, however he clearly had cholecystitis and still has cystic duct occlusion, so I recommended proceeding with cholecystectomy. I will plan for a laparoscopic approach but discussed there is increased possibility for conversion to an open procedure depending on the degree of adhesions and chronic inflammation. I discussed the details of the procedure and the benefits and risks including risk of bile duct injury. He agrees to proceed  with surgery and will be contacted to schedule an elective surgery date. His cholecystostomy should remain to gravity drainage. ° °Ivon Roedel, MD °[facility] °General, Hepatobiliary and Pancreatic Surgery °07/26/21 1:49 PM ° °

## 2021-08-02 NOTE — Progress Notes (Addendum)
Surgical Instructions    Your procedure is scheduled on 08/11/21.  Report to [facility] Entrance "A" at 12:45 PM., then check in with the Admitting office.  Call this number if you have problems the morning of surgery:  [phone]   If you have any questions prior to your surgery date call [phone]: Open Monday-Friday 8am-4pm    Remember:  Do not eat or drink after midnight the night before your surgery      Take these medicines the morning of surgery with A SIP OF WATER:  pravastatin (PRAVACHOL) timolol (TIMOPTIC) - eye drop   As of today, STOP taking any Aspirin (unless otherwise instructed by your surgeon) Aleve, Naproxen, Ibuprofen, Motrin, Advil, Goody's, BC's, all herbal medications, fish oil, and all vitamins.  WHAT DO I DO ABOUT MY DIABETES MEDICATION?   Do not take oral diabetes medicines (pills) the morning of surgery. DO NOT take Metformin or Januvia or Glipizide day of surgery.   THE NIGHT BEFORE SURGERY, do not take Lantus Insulin.      The day of surgery, do not take other diabetes injectables, including Ozempic, Byetta (exenatide), Bydureon (exenatide ER), Victoza (liraglutide), or Trulicity (dulaglutide).   HOW TO MANAGE YOUR DIABETES BEFORE AND AFTER SURGERY  Why is it important to control my blood sugar before and after surgery? Improving blood sugar levels before and after surgery helps healing and can limit problems. A way of improving blood sugar control is eating a healthy diet by:  Eating less sugar and carbohydrates  Increasing activity/exercise  Talking with your doctor about reaching your blood sugar goals High blood sugars (greater than 180 mg/dL) can raise your risk of infections and slow your recovery, so you will need to focus on controlling your diabetes during the weeks before surgery. Make sure that the doctor who takes care of your diabetes knows about your planned surgery including the date and location.  How do I manage my  blood sugar before surgery? Check your blood sugar at least 4 times a day, starting 2 days before surgery, to make sure that the level is not too high or low.  Check your blood sugar the morning of your surgery when you wake up and every 2 hours until you get to the Short Stay unit.  If your blood sugar is less than 70 mg/dL, you will need to treat for low blood sugar: Do not take insulin. Treat a low blood sugar (less than 70 mg/dL) with  cup of clear juice (cranberry or apple), 4 glucose tablets, OR glucose gel. Recheck blood sugar in 15 minutes after treatment (to make sure it is greater than 70 mg/dL). If your blood sugar is not greater than 70 mg/dL on recheck, call [phone] for further instructions. Report your blood sugar to the short stay nurse when you get to Short Stay.  If you are admitted to the hospital after surgery: Your blood sugar will be checked by the staff and you will probably be given insulin after surgery (instead of oral diabetes medicines) to make sure you have good blood sugar levels. The goal for blood sugar control after surgery is 80-180 mg/dL.            Do not wear jewery Do not wear lotions, powders, colognes, or deodorant. Do not shave 48 hours prior to surgery.  Men may shave face and neck. Do not bring valuables to the hospital.              [facility] is not responsible for any belongings or valuables.  Do NOT Smoke (Tobacco/Vaping)  24 hours prior to your procedure  If you use a CPAP at night, you may bring your mask for your overnight stay.   Contacts, glasses, hearing aids, dentures or partials may not be worn into surgery, please bring cases for these belongings   For patients admitted to the hospital, discharge time will be determined by your treatment team.   Patients discharged the day of surgery will not be allowed to drive home, and someone needs to stay with them for 24 hours.  NO VISITORS WILL BE ALLOWED IN PRE-OP WHERE PATIENTS  ARE PREPPED FOR SURGERY.  ONLY 1 SUPPORT PERSON MAY BE PRESENT IN THE WAITING ROOM WHILE YOU ARE IN SURGERY.  IF YOU ARE TO BE ADMITTED, ONCE YOU ARE IN YOUR ROOM YOU WILL BE ALLOWED TWO (2) VISITORS. 1 (ONE) VISITOR MAY STAY OVERNIGHT BUT MUST ARRIVE TO THE ROOM BY 8pm.  Minor children may have two parents present. Special consideration for safety and communication needs will be reviewed on a case by case basis.  Special instructions:    Oral Hygiene is also important to reduce your risk of infection.  Remember - BRUSH YOUR TEETH THE MORNING OF SURGERY WITH YOUR REGULAR TOOTHPASTE   [place]- Preparing For Surgery  Before surgery, you can play an important role. Because skin is not sterile, your skin needs to be as free of germs as possible. You can reduce the number of germs on your skin by washing with CHG (chlorahexidine gluconate) Soap before surgery.  CHG is an antiseptic cleaner which kills germs and bonds with the skin to continue killing germs even after washing.     Please do not use if you have an allergy to CHG or antibacterial soaps. If your skin becomes reddened/irritated stop using the CHG.  Do not shave (including legs and underarms) for at least 48 hours prior to first CHG shower. It is OK to shave your face.  Please follow these instructions carefully.     Shower the NIGHT BEFORE SURGERY and the MORNING OF SURGERY with CHG Soap.   If you chose to wash your hair, wash your hair first as usual with your normal shampoo. After you shampoo, rinse your hair and body thoroughly to remove the shampoo.  Then [employer] and genitals (private parts) with your normal soap and rinse thoroughly to remove soap.  After that Use CHG Soap as you would any other liquid soap. You can apply CHG directly to the skin and wash gently with a scrungie or a clean washcloth.   Apply the CHG Soap to your body ONLY FROM THE NECK DOWN.  Do not use on open wounds or open sores. Avoid contact with your  eyes, ears, mouth and genitals (private parts). Wash Face and genitals (private parts)  with your normal soap.   Wash thoroughly, paying special attention to the area where your surgery will be performed.  Thoroughly rinse your body with warm water from the neck down.  DO NOT shower/wash with your normal soap after using and rinsing off the CHG Soap.  Pat yourself dry with a CLEAN TOWEL.  Wear CLEAN PAJAMAS to bed the night before surgery  Place CLEAN SHEETS on your bed the night before your surgery  DO NOT SLEEP WITH PETS.   Day of Surgery:  Take a shower with CHG soap. Wear Clean/Comfortable clothing the morning of surgery Do not apply any  deodorants/lotions.   Remember to brush your teeth WITH YOUR REGULAR TOOTHPASTE.   Please read over the following fact sheets that you were given.

## 2021-08-03 ENCOUNTER — Other Ambulatory Visit: Payer: Self-pay

## 2021-08-03 ENCOUNTER — Encounter (HOSPITAL_COMMUNITY)
Admission: RE | Admit: 2021-08-03 | Discharge: 2021-08-03 | Disposition: A | Discharge status: Home / Self Care | Payer: Medicare Other | Source: Ambulatory Visit | Attending: Surgery | Admitting: Surgery

## 2021-08-03 ENCOUNTER — Encounter (HOSPITAL_COMMUNITY): Payer: Self-pay

## 2021-08-03 DIAGNOSIS — Z01812 Encounter for preprocedural laboratory examination: Secondary | ICD-10-CM | POA: Insufficient documentation

## 2021-08-03 DIAGNOSIS — Z9049 Acquired absence of other specified parts of digestive tract: Secondary | ICD-10-CM | POA: Diagnosis not present

## 2021-08-03 DIAGNOSIS — Z794 Long term (current) use of insulin: Secondary | ICD-10-CM | POA: Insufficient documentation

## 2021-08-03 DIAGNOSIS — K82A2 Perforation of gallbladder in cholecystitis: Secondary | ICD-10-CM | POA: Insufficient documentation

## 2021-08-03 DIAGNOSIS — E119 Type 2 diabetes mellitus without complications: Secondary | ICD-10-CM | POA: Insufficient documentation

## 2021-08-03 DIAGNOSIS — I441 Atrioventricular block, second degree: Secondary | ICD-10-CM | POA: Insufficient documentation

## 2021-08-03 DIAGNOSIS — K811 Chronic cholecystitis: Secondary | ICD-10-CM | POA: Diagnosis not present

## 2021-08-03 DIAGNOSIS — Z8701 Personal history of pneumonia (recurrent): Secondary | ICD-10-CM | POA: Insufficient documentation

## 2021-08-03 DIAGNOSIS — D649 Anemia, unspecified: Secondary | ICD-10-CM | POA: Insufficient documentation

## 2021-08-03 DIAGNOSIS — Z01818 Encounter for other preprocedural examination: Secondary | ICD-10-CM

## 2021-08-03 DIAGNOSIS — K750 Abscess of liver: Secondary | ICD-10-CM | POA: Diagnosis not present

## 2021-08-03 HISTORY — DX: Cardiac arrhythmia, unspecified: I49.9

## 2021-08-03 LAB — BASIC METABOLIC PANEL
Anion gap: 10 (ref 5–15)
BUN: 10 mg/dL (ref 8–23)
CO2: 24 mmol/L (ref 22–32)
Calcium: 9.1 mg/dL (ref 8.9–10.3)
Chloride: 106 mmol/L (ref 98–111)
Creatinine, Ser: 0.88 mg/dL (ref 0.61–1.24)
GFR, Estimated: >60 mL/min (ref >60)
Glucose, Bld: 144 mg/dL — ABNORMAL HIGH (ref 70–99)
Potassium: 4.3 mmol/L (ref 3.5–5.1)
Sodium: 140 mmol/L (ref 135–145)

## 2021-08-03 LAB — CBC
HCT: 38.1 % — ABNORMAL LOW (ref 39.0–52.0)
Hemoglobin: 12.1 g/dL — ABNORMAL LOW (ref 13.0–17.0)
MCH: 30.3 pg (ref 26.0–34.0)
MCHC: 31.8 g/dL (ref 30.0–36.0)
MCV: 95.3 fL (ref 80.0–100.0)
Platelets: 245 10*3/uL (ref 150–400)
RBC: 4 MIL/uL — ABNORMAL LOW (ref 4.22–5.81)
RDW: 13.8 % (ref 11.5–15.5)
WBC: 4.5 10*3/uL (ref 4.0–10.5)
nRBC: 0 % (ref 0.0–0.2)

## 2021-08-03 LAB — GLUCOSE, CAPILLARY: Glucose-Capillary: 163 mg/dL — ABNORMAL HIGH (ref 70–99)

## 2021-08-03 NOTE — Progress Notes (Signed)
PCP: Renford Dills, MD Cardiologist: Thurmon Fair, MD  EKG:07/19/21 CXR: 05/30/21 ECHO: denies Stress Test: denies Cardiac Cath: denies  Fasting Blood Sugar- 100 Checks Blood Sugar__1_ times a day  ASA: Will call surgeon today for instructions Blood Thinner: No  OSA/CPAP: No  Covid test not needed - Ambulatory  Anesthesia Review: Yes.  EKG 11/22 Mobitz.  See note from Dr. Royann Shivers.  Patient denies shortness of breath, fever, cough, and chest pain at PAT appointment.  Patient verbalized understanding of instructions provided today at the PAT appointment.  Patient asked to review instructions at home and day of surgery.

## 2021-08-04 DIAGNOSIS — H5213 Myopia, bilateral: Secondary | ICD-10-CM | POA: Diagnosis not present

## 2021-08-04 DIAGNOSIS — E113393 Type 2 diabetes mellitus with moderate nonproliferative diabetic retinopathy without macular edema, bilateral: Secondary | ICD-10-CM | POA: Diagnosis not present

## 2021-08-04 DIAGNOSIS — H26492 Other secondary cataract, left eye: Secondary | ICD-10-CM | POA: Diagnosis not present

## 2021-08-04 DIAGNOSIS — H40023 Open angle with borderline findings, high risk, bilateral: Secondary | ICD-10-CM | POA: Diagnosis not present

## 2021-08-04 LAB — HEMOGLOBIN A1C
Hgb A1c MFr Bld: 7.6 % — ABNORMAL HIGH (ref 4.8–5.6)
Mean Plasma Glucose: 171 mg/dL

## 2021-08-07 NOTE — Anesthesia Preprocedure Evaluation (Addendum)
Anesthesia Evaluation  Patient identified by MRN, date of birth, ID band Patient awake    History of Anesthesia Complications (+) POST - OP SPINAL HEADACHE and history of anesthetic complications  Airway Mallampati: II  TM Distance: >3 FB     Dental   Pulmonary    breath sounds clear to auscultation       Cardiovascular hypertension, + dysrhythmias  Rhythm:Regular Rate:Normal     Neuro/Psych    GI/Hepatic Neg liver ROS, Hx noted    Endo/Other  diabetes  Renal/GU      Musculoskeletal   Abdominal   Peds  Hematology   Anesthesia Other Findings   Reproductive/Obstetrics                           Anesthesia Physical Anesthesia Plan  ASA: 3  Anesthesia Plan: General   Post-op Pain Management:    Induction: Intravenous  PONV Risk Score and Plan: 2 and Ondansetron, Dexamethasone and Midazolam  Airway Management Planned: Oral ETT  Additional Equipment:   Intra-op Plan:   Post-operative Plan: Extubation in OR  Informed Consent: I have reviewed the patients History and Physical, chart, labs and discussed the procedure including the risks, benefits and alternatives for the proposed anesthesia with the patient or authorized representative who has indicated his/her understanding and acceptance.     Dental advisory given  Plan Discussed with: CRNA and Anesthesiologist  Anesthesia Plan Comments: (PAT note by Antionette Poles, PA-C: Patient recently evaluated by cardiology for second-degree AV block at the request of patient's PCP Dr. Renford Dills.  EKG abnormality occurred in the setting of recent acute illness. He was admitted in early December 2022 with acute cholecystitis with recent liver abscess presumably secondary to perforation, as well as a concurrent pneumonia. He had a percutaneous cholecystostomy placed during that admission which remained in place. He was seen by Dr. Royann Shivers  07/19/2021.  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 He has narrow QRS complex on all his ECG tracings and on monitor strips. He has never experienced symptoms of bradycardia or syncope. He does not require additional investigation or treatment for this disorder."  Dr. Royann Shivers also commented on upcoming surgery.  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  Preop labs reviewed, mild anemia with hemoglobin 12.1, otherwise unremarkable.  EKG 07/19/2021: NSR.  Rate 92.  Moderate voltage criteria for LVH, may be normal variant.  )       Anesthesia Quick Evaluation

## 2021-08-07 NOTE — Progress Notes (Signed)
Anesthesia Chart Review:  Patient recently evaluated by cardiology for second-degree AV block at the request of patient's PCP Dr. Renford Dills.  EKG abnormality occurred in the setting of recent acute illness. He was admitted in early December 2022 with acute cholecystitis with recent liver abscess presumably secondary to perforation, as well as a concurrent pneumonia.  He had a percutaneous cholecystostomy placed during that admission which remained in place. He was seen by Dr. Royann Shivers 07/19/2021.  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  He has narrow QRS complex on all his ECG tracings and on monitor strips.  He has never experienced symptoms of bradycardia or syncope.  He does not require additional investigation or treatment for this disorder."  Dr. Royann Shivers also commented on upcoming surgery.  Per note, "Chronic cholecystitis with gallbladder perforation into the liver: Is likely that he will require a fairly complicated surgery, but from a cardiovascular standpoint there is no reason to suspect that he would not do well."  IDDM 2, A1c 7.6 on preop labs.  Preop labs reviewed, mild anemia with hemoglobin 12.1, otherwise unremarkable.  EKG 07/19/2021: NSR.  Rate 92.  Moderate voltage criteria for LVH, may be normal variant.    Zannie Cove [facility] Stay [facility]/Anesthesiology Phone [phone] 08/07/2021 12:01 PM

## 2021-08-11 ENCOUNTER — Ambulatory Visit (HOSPITAL_COMMUNITY): Payer: Medicare Other | Admitting: Physician Assistant

## 2021-08-11 ENCOUNTER — Encounter (HOSPITAL_COMMUNITY): Payer: Self-pay | Admitting: Surgery

## 2021-08-11 ENCOUNTER — Other Ambulatory Visit: Payer: Self-pay

## 2021-08-11 ENCOUNTER — Inpatient Hospital Stay (HOSPITAL_COMMUNITY)
Admission: RE | Admit: 2021-08-11 | Discharge: 2021-08-15 | DRG: 410 | Disposition: A | Discharge status: Home / Self Care | Payer: Medicare Other | Source: Ambulatory Visit | Attending: Surgery | Admitting: Surgery

## 2021-08-11 ENCOUNTER — Encounter (HOSPITAL_COMMUNITY)
Admission: RE | Disposition: A | Discharge status: Home / Self Care | Payer: Self-pay | Source: Ambulatory Visit | Attending: Surgery

## 2021-08-11 ENCOUNTER — Ambulatory Visit (HOSPITAL_COMMUNITY): Payer: Medicare Other | Admitting: Certified Registered"

## 2021-08-11 DIAGNOSIS — E785 Hyperlipidemia, unspecified: Secondary | ICD-10-CM | POA: Diagnosis present

## 2021-08-11 DIAGNOSIS — Z833 Family history of diabetes mellitus: Secondary | ICD-10-CM

## 2021-08-11 DIAGNOSIS — R001 Bradycardia, unspecified: Secondary | ICD-10-CM | POA: Diagnosis not present

## 2021-08-11 DIAGNOSIS — K660 Peritoneal adhesions (postprocedural) (postinfection): Secondary | ICD-10-CM | POA: Diagnosis present

## 2021-08-11 DIAGNOSIS — Z7982 Long term (current) use of aspirin: Secondary | ICD-10-CM

## 2021-08-11 DIAGNOSIS — K750 Abscess of liver: Secondary | ICD-10-CM | POA: Diagnosis not present

## 2021-08-11 DIAGNOSIS — Z8249 Family history of ischemic heart disease and other diseases of the circulatory system: Secondary | ICD-10-CM

## 2021-08-11 DIAGNOSIS — Z7984 Long term (current) use of oral hypoglycemic drugs: Secondary | ICD-10-CM | POA: Diagnosis not present

## 2021-08-11 DIAGNOSIS — K8013 Calculus of gallbladder with acute and chronic cholecystitis with obstruction: Secondary | ICD-10-CM | POA: Diagnosis present

## 2021-08-11 DIAGNOSIS — E119 Type 2 diabetes mellitus without complications: Secondary | ICD-10-CM | POA: Diagnosis not present

## 2021-08-11 DIAGNOSIS — K812 Acute cholecystitis with chronic cholecystitis: Secondary | ICD-10-CM | POA: Diagnosis present

## 2021-08-11 DIAGNOSIS — H409 Unspecified glaucoma: Secondary | ICD-10-CM | POA: Diagnosis present

## 2021-08-11 DIAGNOSIS — Z823 Family history of stroke: Secondary | ICD-10-CM

## 2021-08-11 DIAGNOSIS — Z5331 Laparoscopic surgical procedure converted to open procedure: Secondary | ICD-10-CM

## 2021-08-11 DIAGNOSIS — K219 Gastro-esophageal reflux disease without esophagitis: Secondary | ICD-10-CM | POA: Diagnosis present

## 2021-08-11 DIAGNOSIS — Z808 Family history of malignant neoplasm of other organs or systems: Secondary | ICD-10-CM

## 2021-08-11 DIAGNOSIS — K82A1 Gangrene of gallbladder in cholecystitis: Secondary | ICD-10-CM | POA: Diagnosis present

## 2021-08-11 DIAGNOSIS — K8000 Calculus of gallbladder with acute cholecystitis without obstruction: Secondary | ICD-10-CM

## 2021-08-11 DIAGNOSIS — Z803 Family history of malignant neoplasm of breast: Secondary | ICD-10-CM | POA: Diagnosis not present

## 2021-08-11 DIAGNOSIS — Z79899 Other long term (current) drug therapy: Secondary | ICD-10-CM

## 2021-08-11 DIAGNOSIS — I10 Essential (primary) hypertension: Secondary | ICD-10-CM | POA: Diagnosis present

## 2021-08-11 DIAGNOSIS — K8010 Calculus of gallbladder with chronic cholecystitis without obstruction: Secondary | ICD-10-CM | POA: Diagnosis not present

## 2021-08-11 DIAGNOSIS — K82A2 Perforation of gallbladder in cholecystitis: Secondary | ICD-10-CM | POA: Diagnosis not present

## 2021-08-11 HISTORY — PX: CHOLECYSTECTOMY: SHX55

## 2021-08-11 LAB — CBC
HCT: 39 % (ref 39.0–52.0)
Hemoglobin: 12.4 g/dL — ABNORMAL LOW (ref 13.0–17.0)
MCH: 30.2 pg (ref 26.0–34.0)
MCHC: 31.8 g/dL (ref 30.0–36.0)
MCV: 95.1 fL (ref 80.0–100.0)
Platelets: 188 10*3/uL (ref 150–400)
RBC: 4.1 MIL/uL — ABNORMAL LOW (ref 4.22–5.81)
RDW: 13.6 % (ref 11.5–15.5)
WBC: 9.1 10*3/uL (ref 4.0–10.5)
nRBC: 0 % (ref 0.0–0.2)

## 2021-08-11 LAB — GLUCOSE, CAPILLARY
Glucose-Capillary: 126 mg/dL — ABNORMAL HIGH (ref 70–99)
Glucose-Capillary: 137 mg/dL — ABNORMAL HIGH (ref 70–99)
Glucose-Capillary: 162 mg/dL — ABNORMAL HIGH (ref 70–99)
Glucose-Capillary: 140 mg/dL — ABNORMAL HIGH (ref 70–99)

## 2021-08-11 LAB — CREATININE, SERUM
Creatinine, Ser: 0.85 mg/dL (ref 0.61–1.24)
GFR, Estimated: >60 mL/min (ref >60)

## 2021-08-11 SURGERY — LAPAROSCOPIC CHOLECYSTECTOMY
Anesthesia: General | Site: Abdomen

## 2021-08-11 MED ORDER — OXYCODONE HCL 5 MG PO TABS
5.0000 mg | ORAL_TABLET | ORAL | Status: DC | PRN
  Administered 2021-08-11 – 2021-08-15 (×3): 10 mg via ORAL
  Filled 2021-08-11 (×3): qty 2

## 2021-08-11 MED ORDER — ADULT MULTIVITAMIN W/MINERALS CH
1.0000 | ORAL_TABLET | Freq: Every day | ORAL | Status: DC
  Administered 2021-08-12 – 2021-08-15 (×4): 1 via ORAL
  Filled 2021-08-11 (×4): qty 1

## 2021-08-11 MED ORDER — LACTATED RINGERS IV SOLN: INTRAVENOUS | Status: DC

## 2021-08-11 MED ORDER — ACETAMINOPHEN 500 MG PO TABS: 1000.0000 mg | ORAL_TABLET | ORAL | Status: AC

## 2021-08-11 MED ORDER — CEFAZOLIN SODIUM-DEXTROSE 2-4 GM/100ML-% IV SOLN
2.0000 g | INTRAVENOUS | Status: AC
  Administered 2021-08-11: 2 g via INTRAVENOUS

## 2021-08-11 MED ORDER — PHENYLEPHRINE 40 MCG/ML (10ML) SYRINGE FOR IV PUSH (FOR BLOOD PRESSURE SUPPORT)
PREFILLED_SYRINGE | INTRAVENOUS | Status: DC | PRN
  Administered 2021-08-11: 80 ug via INTRAVENOUS

## 2021-08-11 MED ORDER — ACETAMINOPHEN 500 MG PO TABS
1000.0000 mg | ORAL_TABLET | Freq: Three times a day (TID) | ORAL | Status: DC
  Administered 2021-08-11 – 2021-08-15 (×12): 1000 mg via ORAL
  Filled 2021-08-11 (×12): qty 2

## 2021-08-11 MED ORDER — METRONIDAZOLE 500 MG/100ML IV SOLN
500.0000 mg | INTRAVENOUS | Status: AC
  Administered 2021-08-11: 500 mg via INTRAVENOUS
  Filled 2021-08-11: qty 100

## 2021-08-11 MED ORDER — DOCUSATE SODIUM 100 MG PO CAPS
100.0000 mg | ORAL_CAPSULE | Freq: Two times a day (BID) | ORAL | Status: DC
  Administered 2021-08-11 – 2021-08-15 (×9): 100 mg via ORAL
  Filled 2021-08-11 (×9): qty 1

## 2021-08-11 MED ORDER — INDOCYANINE GREEN 25 MG IV SOLR
INTRAVENOUS | Status: DC | PRN
  Administered 2021-08-11: 4 mg via INTRAVENOUS

## 2021-08-11 MED ORDER — DEXAMETHASONE SODIUM PHOSPHATE 10 MG/ML IJ SOLN
INTRAMUSCULAR | Status: AC
  Filled 2021-08-11: qty 1

## 2021-08-11 MED ORDER — FENTANYL CITRATE (PF) 100 MCG/2ML IJ SOLN
INTRAMUSCULAR | Status: DC | PRN
  Administered 2021-08-11: 50 ug via INTRAVENOUS
  Administered 2021-08-11: 100 ug via INTRAVENOUS

## 2021-08-11 MED ORDER — 0.9 % SODIUM CHLORIDE (POUR BTL) OPTIME
TOPICAL | Status: DC | PRN
  Administered 2021-08-11 (×2): 1000 mL

## 2021-08-11 MED ORDER — ASPIRIN EC 81 MG PO TBEC
81.0000 mg | DELAYED_RELEASE_TABLET | Freq: Every day | ORAL | Status: DC
  Administered 2021-08-12 – 2021-08-15 (×4): 81 mg via ORAL
  Filled 2021-08-11 (×4): qty 1

## 2021-08-11 MED ORDER — CHLORHEXIDINE GLUCONATE 0.12 % MT SOLN
15.0000 mL | Freq: Once | OROMUCOSAL | Status: AC
  Administered 2021-08-11: 15 mL via OROMUCOSAL
  Filled 2021-08-11: qty 15

## 2021-08-11 MED ORDER — DIPHENHYDRAMINE HCL 50 MG/ML IJ SOLN: 12.5000 mg | Freq: Four times a day (QID) | INTRAMUSCULAR | Status: DC | PRN

## 2021-08-11 MED ORDER — PROPOFOL 10 MG/ML IV BOLUS
INTRAVENOUS | Status: DC | PRN
Start: 2021-08-11 — End: 2021-08-11
  Administered 2021-08-11: 150 mg via INTRAVENOUS

## 2021-08-11 MED ORDER — PRAVASTATIN SODIUM 10 MG PO TABS
20.0000 mg | ORAL_TABLET | Freq: Every day | ORAL | Status: DC
  Administered 2021-08-11 – 2021-08-15 (×5): 20 mg via ORAL
  Filled 2021-08-11 (×5): qty 2

## 2021-08-11 MED ORDER — LIDOCAINE 2% (20 MG/ML) 5 ML SYRINGE
INTRAMUSCULAR | Status: DC | PRN
Start: 2021-08-11 — End: 2021-08-11
  Administered 2021-08-11: 100 mg via INTRAVENOUS

## 2021-08-11 MED ORDER — LOSARTAN POTASSIUM 25 MG PO TABS
25.0000 mg | ORAL_TABLET | Freq: Every day | ORAL | Status: DC
  Administered 2021-08-12 – 2021-08-15 (×4): 25 mg via ORAL
  Filled 2021-08-11 (×4): qty 1

## 2021-08-11 MED ORDER — ROCURONIUM BROMIDE 10 MG/ML (PF) SYRINGE
PREFILLED_SYRINGE | INTRAVENOUS | Status: AC
  Filled 2021-08-11: qty 10

## 2021-08-11 MED ORDER — POLYETHYLENE GLYCOL 3350 17 G PO PACK: 17.0000 g | PACK | Freq: Every day | ORAL | Status: DC | PRN

## 2021-08-11 MED ORDER — LATANOPROST 0.005 % OP SOLN
1.0000 [drp] | Freq: Every day | OPHTHALMIC | Status: DC
  Administered 2021-08-11 – 2021-08-14 (×4): 1 [drp] via OPHTHALMIC
  Filled 2021-08-11: qty 2.5

## 2021-08-11 MED ORDER — SUGAMMADEX SODIUM 200 MG/2ML IV SOLN
INTRAVENOUS | Status: DC | PRN
Start: 2021-08-11 — End: 2021-08-11
  Administered 2021-08-11: 400 mg via INTRAVENOUS

## 2021-08-11 MED ORDER — SIMETHICONE 80 MG PO CHEW
40.0000 mg | CHEWABLE_TABLET | Freq: Four times a day (QID) | ORAL | Status: DC | PRN
  Administered 2021-08-12: 40 mg via ORAL
  Filled 2021-08-11: qty 1

## 2021-08-11 MED ORDER — ONDANSETRON HCL 4 MG/2ML IJ SOLN
INTRAMUSCULAR | Status: AC
  Filled 2021-08-11: qty 2

## 2021-08-11 MED ORDER — GABAPENTIN 300 MG PO CAPS
ORAL_CAPSULE | ORAL | Status: AC
  Administered 2021-08-11: 300 mg via ORAL
  Filled 2021-08-11: qty 1

## 2021-08-11 MED ORDER — CEFAZOLIN SODIUM-DEXTROSE 2-4 GM/100ML-% IV SOLN
INTRAVENOUS | Status: AC
  Filled 2021-08-11: qty 100

## 2021-08-11 MED ORDER — GABAPENTIN 300 MG PO CAPS: 300.0000 mg | ORAL_CAPSULE | ORAL | Status: AC

## 2021-08-11 MED ORDER — INSULIN GLARGINE-YFGN 100 UNIT/ML ~~LOC~~ SOLN
20.0000 [IU] | Freq: Every day | SUBCUTANEOUS | Status: DC
  Administered 2021-08-11 – 2021-08-13 (×3): 20 [IU] via SUBCUTANEOUS
  Filled 2021-08-11 (×4): qty 0.2

## 2021-08-11 MED ORDER — LIDOCAINE 2% (20 MG/ML) 5 ML SYRINGE
INTRAMUSCULAR | Status: AC
  Filled 2021-08-11: qty 5

## 2021-08-11 MED ORDER — METHOCARBAMOL 500 MG PO TABS
500.0000 mg | ORAL_TABLET | Freq: Four times a day (QID) | ORAL | Status: DC
  Administered 2021-08-11 – 2021-08-15 (×17): 500 mg via ORAL
  Filled 2021-08-11 (×17): qty 1

## 2021-08-11 MED ORDER — GLYCOPYRROLATE PF 0.2 MG/ML IJ SOSY
PREFILLED_SYRINGE | INTRAMUSCULAR | Status: AC
  Filled 2021-08-11: qty 1

## 2021-08-11 MED ORDER — MIDAZOLAM HCL 2 MG/2ML IJ SOLN
INTRAMUSCULAR | Status: AC
  Filled 2021-08-11: qty 2

## 2021-08-11 MED ORDER — INSULIN ASPART 100 UNIT/ML IJ SOLN
0.0000 [IU] | Freq: Every day | INTRAMUSCULAR | Status: DC
  Administered 2021-08-13 – 2021-08-14 (×2): 2 [IU] via SUBCUTANEOUS

## 2021-08-11 MED ORDER — PHENYLEPHRINE 40 MCG/ML (10ML) SYRINGE FOR IV PUSH (FOR BLOOD PRESSURE SUPPORT)
PREFILLED_SYRINGE | INTRAVENOUS | Status: AC
  Filled 2021-08-11: qty 10

## 2021-08-11 MED ORDER — HYDROMORPHONE HCL 1 MG/ML IJ SOLN
0.2500 mg | INTRAMUSCULAR | Status: DC | PRN
  Administered 2021-08-11 (×3): 0.5 mg via INTRAVENOUS

## 2021-08-11 MED ORDER — ALBUMIN HUMAN 5 % IV SOLN
INTRAVENOUS | Status: DC | PRN
Start: 2021-08-11 — End: 2021-08-11

## 2021-08-11 MED ORDER — BUPIVACAINE-EPINEPHRINE (PF) 0.25% -1:200000 IJ SOLN
INTRAMUSCULAR | Status: AC
  Filled 2021-08-11: qty 30

## 2021-08-11 MED ORDER — PROPOFOL 10 MG/ML IV BOLUS
INTRAVENOUS | Status: AC
  Filled 2021-08-11: qty 20

## 2021-08-11 MED ORDER — TIMOLOL MALEATE 0.5 % OP SOLN
1.0000 [drp] | Freq: Two times a day (BID) | OPHTHALMIC | Status: DC
  Administered 2021-08-11 – 2021-08-15 (×9): 1 [drp] via OPHTHALMIC
  Filled 2021-08-11 (×3): qty 5

## 2021-08-11 MED ORDER — INSULIN ASPART 100 UNIT/ML IJ SOLN: 0.0000 [IU] | INTRAMUSCULAR | Status: DC | PRN

## 2021-08-11 MED ORDER — ORAL CARE MOUTH RINSE: 15.0000 mL | Freq: Once | OROMUCOSAL | Status: AC

## 2021-08-11 MED ORDER — ACETAMINOPHEN 500 MG PO TABS
ORAL_TABLET | ORAL | Status: AC
  Administered 2021-08-11: 1000 mg via ORAL
  Filled 2021-08-11: qty 2

## 2021-08-11 MED ORDER — ROCURONIUM BROMIDE 10 MG/ML (PF) SYRINGE
PREFILLED_SYRINGE | INTRAVENOUS | Status: DC | PRN
Start: 2021-08-11 — End: 2021-08-11
  Administered 2021-08-11: 70 mg via INTRAVENOUS
  Administered 2021-08-11: 10 mg via INTRAVENOUS

## 2021-08-11 MED ORDER — DIPHENHYDRAMINE HCL 12.5 MG/5ML PO ELIX: 12.5000 mg | ORAL_SOLUTION | Freq: Four times a day (QID) | ORAL | Status: DC | PRN

## 2021-08-11 MED ORDER — GLYCOPYRROLATE PF 0.2 MG/ML IJ SOSY
PREFILLED_SYRINGE | INTRAMUSCULAR | Status: DC | PRN
  Administered 2021-08-11: .2 mg via INTRAVENOUS

## 2021-08-11 MED ORDER — HYDROMORPHONE HCL 1 MG/ML IJ SOLN
INTRAMUSCULAR | Status: AC
  Administered 2021-08-11: 0.5 mg via INTRAVENOUS
  Filled 2021-08-11: qty 1

## 2021-08-11 MED ORDER — INSULIN ASPART 100 UNIT/ML IJ SOLN
0.0000 [IU] | Freq: Three times a day (TID) | INTRAMUSCULAR | Status: DC
  Administered 2021-08-11: 3 [IU] via SUBCUTANEOUS
  Administered 2021-08-11 – 2021-08-12 (×3): 4 [IU] via SUBCUTANEOUS
  Administered 2021-08-12: 3 [IU] via SUBCUTANEOUS
  Administered 2021-08-13 – 2021-08-14 (×4): 4 [IU] via SUBCUTANEOUS
  Administered 2021-08-14: 3 [IU] via SUBCUTANEOUS
  Administered 2021-08-14: 4 [IU] via SUBCUTANEOUS
  Administered 2021-08-15: 7 [IU] via SUBCUTANEOUS

## 2021-08-11 MED ORDER — DEXAMETHASONE SODIUM PHOSPHATE 10 MG/ML IJ SOLN
INTRAMUSCULAR | Status: DC | PRN
  Administered 2021-08-11: 10 mg via INTRAVENOUS

## 2021-08-11 MED ORDER — ONDANSETRON HCL 4 MG/2ML IJ SOLN
4.0000 mg | Freq: Four times a day (QID) | INTRAMUSCULAR | Status: DC | PRN
  Administered 2021-08-12 (×2): 4 mg via INTRAVENOUS
  Filled 2021-08-11 (×2): qty 2

## 2021-08-11 MED ORDER — ONDANSETRON 4 MG PO TBDP: 4.0000 mg | ORAL_TABLET | Freq: Four times a day (QID) | ORAL | Status: DC | PRN

## 2021-08-11 MED ORDER — ENOXAPARIN SODIUM 40 MG/0.4ML IJ SOSY
40.0000 mg | PREFILLED_SYRINGE | INTRAMUSCULAR | Status: DC
  Administered 2021-08-12 – 2021-08-15 (×4): 40 mg via SUBCUTANEOUS
  Filled 2021-08-11 (×4): qty 0.4

## 2021-08-11 MED ORDER — FENTANYL CITRATE (PF) 250 MCG/5ML IJ SOLN
INTRAMUSCULAR | Status: AC
  Filled 2021-08-11: qty 5

## 2021-08-11 MED ORDER — ONDANSETRON HCL 4 MG/2ML IJ SOLN
INTRAMUSCULAR | Status: DC | PRN
  Administered 2021-08-11: 4 mg via INTRAVENOUS

## 2021-08-11 MED ORDER — HYDROMORPHONE HCL 1 MG/ML IJ SOLN
INTRAMUSCULAR | Status: AC
  Filled 2021-08-11: qty 1

## 2021-08-11 MED ORDER — HYDROMORPHONE HCL 1 MG/ML IJ SOLN
0.5000 mg | INTRAMUSCULAR | Status: DC | PRN
  Administered 2021-08-11: 0.5 mg via INTRAVENOUS
  Filled 2021-08-11 (×2): qty 0.5

## 2021-08-11 SURGICAL SUPPLY — 73 items
APPLIER CLIP 5 13 M/L LIGAMAX5 (MISCELLANEOUS) ×2
BAG COUNTER SPONGE SURGICOUNT (BAG) ×2 IMPLANT
BIOPATCH RED 1 DISK 7.0 (GAUZE/BANDAGES/DRESSINGS) ×1 IMPLANT
BLADE CLIPPER SURG (BLADE) IMPLANT
BLADE SURG 10 STRL SS (BLADE) ×1 IMPLANT
CANISTER SUCT 3000ML PPV (MISCELLANEOUS) ×2 IMPLANT
CHLORAPREP W/TINT 26 (MISCELLANEOUS) ×2 IMPLANT
CLIP APPLIE 5 13 M/L LIGAMAX5 (MISCELLANEOUS) ×1 IMPLANT
COVER SURGICAL LIGHT HANDLE (MISCELLANEOUS) ×2 IMPLANT
DERMABOND ADHESIVE PROPEN (GAUZE/BANDAGES/DRESSINGS) ×1
DERMABOND ADVANCED (GAUZE/BANDAGES/DRESSINGS) ×1
DERMABOND ADVANCED .7 DNX12 (GAUZE/BANDAGES/DRESSINGS) ×1 IMPLANT
DERMABOND ADVANCED .7 DNX6 (GAUZE/BANDAGES/DRESSINGS) IMPLANT
DRAIN CHANNEL 19F RND (DRAIN) ×1 IMPLANT
DRAPE INCISE IOBAN 66X45 STRL (DRAPES) ×1 IMPLANT
DRSG TEGADERM 4X4.75 (GAUZE/BANDAGES/DRESSINGS) ×1 IMPLANT
ELECT BLADE 6.5 EXT (BLADE) ×1 IMPLANT
ELECT CAUTERY BLADE 6.4 (BLADE) ×1 IMPLANT
ELECT REM PT RETURN 9FT ADLT (ELECTROSURGICAL) ×2
ELECTRODE REM PT RTRN 9FT ADLT (ELECTROSURGICAL) ×1 IMPLANT
EVACUATOR SILICONE 100CC (DRAIN) ×1 IMPLANT
GAUZE SPONGE 2X2 8PLY STRL LF (GAUZE/BANDAGES/DRESSINGS) IMPLANT
GLOVE SRG 8 PF TXTR STRL LF DI (GLOVE) IMPLANT
GLOVE SURG POLY MICRO LF SZ5.5 (GLOVE) ×2 IMPLANT
GLOVE SURG UNDER POLY LF SZ6 (GLOVE) ×2 IMPLANT
GLOVE SURG UNDER POLY LF SZ8 (GLOVE) ×2
GOWN STRL REUS W/ TWL LRG LVL3 (GOWN DISPOSABLE) ×3 IMPLANT
GOWN STRL REUS W/TWL LRG LVL3 (GOWN DISPOSABLE) ×6
HANDLE SUCTION POOLE (INSTRUMENTS) IMPLANT
KIT BASIN OR (CUSTOM PROCEDURE TRAY) ×2 IMPLANT
KIT TURNOVER KIT B (KITS) ×2 IMPLANT
L-HOOK LAP DISP 36CM (ELECTROSURGICAL) ×2
LHOOK LAP DISP 36CM (ELECTROSURGICAL) ×1 IMPLANT
NDL INSUFFLATION 14GA 120MM (NEEDLE) IMPLANT
NEEDLE INSUFFLATION 14GA 120MM (NEEDLE) IMPLANT
NS IRRIG 1000ML POUR BTL (IV SOLUTION) ×2 IMPLANT
PAD ARMBOARD 7.5X6 YLW CONV (MISCELLANEOUS) ×2 IMPLANT
PENCIL BUTTON HOLSTER BLD 10FT (ELECTRODE) ×2 IMPLANT
PENCIL SMOKE EVACUATOR (MISCELLANEOUS) ×1 IMPLANT
POUCH SPECIMEN RETRIEVAL 10MM (ENDOMECHANICALS) ×2 IMPLANT
RETRACTOR WOUND ALXS 34CM XLRG (MISCELLANEOUS) IMPLANT
RTRCTR WOUND ALEXIS 34CM XLRG (MISCELLANEOUS) ×2
SCISSORS LAP 5X35 DISP (ENDOMECHANICALS) ×2 IMPLANT
SET IRRIG TUBING LAPAROSCOPIC (IRRIGATION / IRRIGATOR) ×2 IMPLANT
SET TUBE SMOKE EVAC HIGH FLOW (TUBING) ×2 IMPLANT
SLEEVE ENDOPATH XCEL 5M (ENDOMECHANICALS) ×4 IMPLANT
SPONGE GAUZE 2X2 STER 10/PKG (GAUZE/BANDAGES/DRESSINGS) ×1
SPONGE T-LAP 18X18 ~~LOC~~+RFID (SPONGE) ×3 IMPLANT
SUCTION POOLE HANDLE (INSTRUMENTS) ×2
SUT ETHILON 2 0 FS 18 (SUTURE) ×1 IMPLANT
SUT ETHILON 3 0 PS 1 (SUTURE) ×1 IMPLANT
SUT MNCRL AB 4-0 PS2 18 (SUTURE) ×3 IMPLANT
SUT PDS AB 1 TP1 96 (SUTURE) ×2 IMPLANT
SUT SILK 2 0 (SUTURE) ×2
SUT SILK 2 0 TIES 10X30 (SUTURE) ×1 IMPLANT
SUT SILK 2 0SH CR/8 30 (SUTURE) ×1 IMPLANT
SUT SILK 2-0 18XBRD TIE 12 (SUTURE) IMPLANT
SUT SILK 3 0 (SUTURE) ×1
SUT SILK 3 0SH CR/8 30 (SUTURE) ×1 IMPLANT
SUT SILK 3-0 18XBRD TIE 12 (SUTURE) IMPLANT
SUT VIC AB 3-0 SH 27 (SUTURE) ×2
SUT VIC AB 3-0 SH 27X BRD (SUTURE) IMPLANT
SUT VIC AB 3-0 SH 27XBRD (SUTURE) IMPLANT
TOWEL GREEN STERILE (TOWEL DISPOSABLE) ×2 IMPLANT
TOWEL GREEN STERILE FF (TOWEL DISPOSABLE) ×2 IMPLANT
TRAY LAPAROSCOPIC MC (CUSTOM PROCEDURE TRAY) ×2 IMPLANT
TROCAR XCEL 12X100 BLDLESS (ENDOMECHANICALS) IMPLANT
TROCAR XCEL BLUNT TIP 100MML (ENDOMECHANICALS) ×2 IMPLANT
TROCAR XCEL NON-BLD 5MMX100MML (ENDOMECHANICALS) ×2 IMPLANT
TUBE CONNECTING 12X1/4 (SUCTIONS) ×1 IMPLANT
WARMER LAPAROSCOPE (MISCELLANEOUS) ×2 IMPLANT
WATER STERILE IRR 1000ML POUR (IV SOLUTION) ×2 IMPLANT
YANKAUER SUCT BULB TIP NO VENT (SUCTIONS) ×1 IMPLANT

## 2021-08-11 NOTE — Interval H&P Note (Signed)
History and Physical Interval Note:  08/11/2021 8:18 AM  Andrew Duncan  has presented today for surgery, with the diagnosis of CHOLECYSTITIS.  The various methods of treatment have been discussed with the patient and family. After consideration of risks, benefits and other options for treatment, the patient has consented to  Procedure(s): LAPAROSCOPIC CHOLECYSTECTOMY POSSIBLE OPEN CHOLECYSTECTOMY (N/A) as a surgical intervention.  The patient's history has been reviewed, patient examined, no change in status, stable for surgery.  I have reviewed the patient's chart and labs.  Questions were answered to the patient's satisfaction.  Discussed possibility of conversion to open, which would require inpatient admission. Patient expressed understanding.    Fritzi Mandes

## 2021-08-11 NOTE — Op Note (Signed)
Date: 08/11/21  Patient: Andrew Duncan MRN: [id number]  Preoperative Diagnosis: Cholecystitis Postoperative Diagnosis: Gangrenous cholecystitis  Procedure: Diagnostic laparoscopy, open subtotal fenestrating cholecystectomy  Surgeon: Sophronia Simas, MD Assistants: Marin Olp, MD; Leilani Able, MD (Resident)  EBL: Minimal  Anesthesia: General  Specimens: Gallbladder  Indications: Andrew Duncan is a 76 yo male who presented about 2 months ago with pneumonia and perforated cholecystitis with a liver abscess. He has recovered and percutaneous cholecystostomy remains in place.  Recent cholangiogram demonstrated ongoing occlusion of the cystic duct.  Cholecystectomy was recommended, and after extensive discussion of the risks and benefits of surgery he has agreed to proceed.  Findings: Patient was unable to tolerate pneumoperitoneum due to bradycardia.  Severe contraction and fibrosis of the gallbladder with necrosis of the mucosa.  Gallstones and purulent fluid within the gallbladder.  A fenestrating subtotal cholecystectomy was performed with extraction of all stones from the lumen of the gallbladder.  Procedure details: Informed consent was obtained in the preoperative area prior to the procedure. The patient was brought to the operating room and placed on the table in the supine position.  General anesthesia was induced and appropriate lines and drains were placed for intraoperative monitoring. Perioperative antibiotics were administered per SCIP guidelines. The abdomen was prepped and draped in the usual sterile fashion. A pre-procedure timeout was taken verifying patient identity, surgical site and procedure to be performed.  A small infraumbilical skin incision was made, and the subcutaneous tissue was divided with cautery, and the umbilical stalk was grasped and elevated.  The fascia was sharply incised, the peritoneal cavity was visualized, and a 12 mm Hassan trocar was placed.  The  abdomen was insufflated to a pressure of 15 mmHg.  A laparoscope was inserted and the right upper quadrant was examined.  The gallbladder was not visible due to extensive omental adhesions to the liver.  At this point the patient became bradycardic with a heart rate in the 30s, and the pneumoperitoneum was evacuated, immediate improvement in bradycardia.  The insufflation rate and pressure of pneumoperitonum were both decreased, and Robinul was administered.  Three more attempts were made to insufflate the abdomen, however each time the patient became bradycardic in the 30s each time, which promptly resolved on desufflation.  Given this and the presence of extensive adhesions in the right upper quadrant, the decision was made to convert to a laparotomy.  The port was removed and the abdomen was desufflated.  An upper midline skin incision was made the subcutaneous tissue was divided with cautery.  The fascia was opened along linea alba and the peritoneal cavity was entered.  An Allexis wound protector and Bookwalter fix retractor replaced.  Omental adhesions to the liver and gallbladder were bluntly taken down.  The percutaneous cholecystostomy was cut and removed with the tip of the drain intact.  The cholecystostomy tract was taken down off the abdominal wall to allow better retraction of the liver.  There was omentum adherent to the gallbladder, and this was taken down using blunt dissection.  The gallbladder was very small and contracted, and the cystic triangle had thickened inflammatory tissue.  It was clear that it would not be possible to safely dissect out the cystic duct without significant risk of injury to the common bile duct.  The gallbladder was opened at the dome and the anterior wall was excised.  There was purulent fluid within the gallbladder, and the mucosa was gangrenous.  Several small stones were extracted from within the lumen  of the gallbladder.  The lumen of the cystic duct appeared to be  completely occluded and there was no bile return noted.  The back wall of the gallbladder was fulgurated with cautery.  The surgical site was irrigated with warm saline and appeared hemostatic.  A 19-French JP drain was placed adjacent to the remnant gallbladder and brought out through the right lateral abdominal wall, and secured to the skin with a 2-0 nylon suture.  The retractor and wound protector were removed.  The fascia was closed at midline with a running looped 1 PDS suture.  Scarpa's layer was closed with a running 3-0 Vicryl suture, and the skin was closed with a running subcuticular 4-0 Monocryl suture.  Dermabond was applied.  The patient tolerated the procedure with no apparent complications.  All counts were correct x2 at the end of the procedure. The patient was extubated and taken to PACU in stable condition.  Sophronia Simas, MD 08/11/21 11:23 AM

## 2021-08-11 NOTE — Anesthesia Postprocedure Evaluation (Signed)
Anesthesia Post Note  Patient: LAQUON EMEL  Procedure(s) Performed: ATTEMPTED LAPAROSCOPIC CHOLECYSTECTOMY (Abdomen) OPEN CHOLECYSTECTOMY (Abdomen)     Patient location during evaluation: PACU Anesthesia Type: General Level of consciousness: awake Pain management: pain level controlled Vital Signs Assessment: post-procedure vital signs reviewed and stable Respiratory status: spontaneous breathing Cardiovascular status: stable Postop Assessment: no apparent nausea or vomiting Anesthetic complications: no   No notable events documented.  Last Vitals:  Vitals:   08/11/21 1222 08/11/21 1235  BP: (!) 111/59 128/61  Pulse: 99 100  Resp: 17 (!) 22  Temp:    SpO2: 94% 95%    Last Pain:  Vitals:   08/11/21 1122  TempSrc:   PainSc: 7                  Syretta Kochel

## 2021-08-11 NOTE — Transfer of Care (Signed)
Immediate Anesthesia Transfer of Care Note  Patient: Andrew Duncan  Procedure(s) Performed: ATTEMPTED LAPAROSCOPIC CHOLECYSTECTOMY (Abdomen) OPEN CHOLECYSTECTOMY (Abdomen)  Patient Location: PACU  Anesthesia Type:General  Level of Consciousness: drowsy and patient cooperative  Airway & Oxygen Therapy: Patient Spontanous Breathing and Patient connected to nasal cannula oxygen  Post-op Assessment: Report given to RN, Post -op Vital signs reviewed and stable and Patient moving all extremities  Post vital signs: Reviewed and stable  Last Vitals:  Vitals Value Taken Time  BP 145/69 08/11/21 1055  Temp 36.7 C 08/11/21 1055  Pulse 89 08/11/21 1055  Resp 18 08/11/21 1055  SpO2 95 % 08/11/21 1055  Vitals shown include unvalidated device data.  Last Pain:  Vitals:   08/11/21 0715  TempSrc:   PainSc: 0-No pain      Patients Stated Pain Goal: 0 (08/11/21 0715)  Complications: No notable events documented.

## 2021-08-11 NOTE — Anesthesia Procedure Notes (Addendum)
Procedure Name: Intubation Date/Time: 08/11/2021 9:00 AM Performed by: Moshe Salisbury, CRNA Pre-anesthesia Checklist: Patient identified, Emergency Drugs available, Suction available and Patient being monitored Patient Re-evaluated:Patient Re-evaluated prior to induction Oxygen Delivery Method: Circle System Utilized Preoxygenation: Pre-oxygenation with 100% oxygen Induction Type: IV induction Ventilation: Mask ventilation without difficulty Laryngoscope Size: Mac and 4 Grade View: Grade I Tube type: Oral Tube size: 8.0 mm Number of attempts: 1 Airway Equipment and Method: Stylet and Oral airway Placement Confirmation: ETT inserted through vocal cords under direct vision, positive ETCO2 and breath sounds checked- equal and bilateral Secured at: 23 cm Tube secured with: Tape Dental Injury: Teeth and Oropharynx as per pre-operative assessment

## 2021-08-12 ENCOUNTER — Encounter (HOSPITAL_COMMUNITY): Payer: Self-pay | Admitting: Surgery

## 2021-08-12 LAB — CBC
HCT: 40.1 % (ref 39.0–52.0)
Hemoglobin: 12.6 g/dL — ABNORMAL LOW (ref 13.0–17.0)
MCH: 29.6 pg (ref 26.0–34.0)
MCHC: 31.4 g/dL (ref 30.0–36.0)
MCV: 94.4 fL (ref 80.0–100.0)
Platelets: 230 10*3/uL (ref 150–400)
RBC: 4.25 MIL/uL (ref 4.22–5.81)
RDW: 13.8 % (ref 11.5–15.5)
WBC: 11.8 10*3/uL — ABNORMAL HIGH (ref 4.0–10.5)
nRBC: 0 % (ref 0.0–0.2)

## 2021-08-12 LAB — BASIC METABOLIC PANEL
Anion gap: 10 (ref 5–15)
BUN: 11 mg/dL (ref 8–23)
CO2: 24 mmol/L (ref 22–32)
Calcium: 9.1 mg/dL (ref 8.9–10.3)
Chloride: 105 mmol/L (ref 98–111)
Creatinine, Ser: 0.81 mg/dL (ref 0.61–1.24)
GFR, Estimated: >60 mL/min (ref >60)
Glucose, Bld: 130 mg/dL — ABNORMAL HIGH (ref 70–99)
Potassium: 4.6 mmol/L (ref 3.5–5.1)
Sodium: 139 mmol/L (ref 135–145)

## 2021-08-12 LAB — GLUCOSE, CAPILLARY
Glucose-Capillary: 126 mg/dL — ABNORMAL HIGH (ref 70–99)
Glucose-Capillary: 165 mg/dL — ABNORMAL HIGH (ref 70–99)
Glucose-Capillary: 179 mg/dL — ABNORMAL HIGH (ref 70–99)
Glucose-Capillary: 196 mg/dL — ABNORMAL HIGH (ref 70–99)

## 2021-08-12 MED ORDER — ALUM & MAG HYDROXIDE-SIMETH 200-200-20 MG/5ML PO SUSP
15.0000 mL | ORAL | Status: DC | PRN
  Administered 2021-08-12: 15 mL via ORAL
  Filled 2021-08-12: qty 30

## 2021-08-12 NOTE — Progress Notes (Signed)
Progress Note  1 Day Post-Op  Subjective: Having some reflux and nausea this am. No emesis. Mild to moderate abdominal pain. Has not ambulated much. Has been drinking clears  Objective: Vital signs in last 24 hours: Temp:  [97.6 F (36.4 C)-99.2 F (37.3 C)] 97.9 F (36.6 C) (02/11 0442) Pulse Rate:  [88-106] 103 (02/11 0442) Resp:  [12-22] 18 (02/11 0442) BP: (111-158)/(51-73) 134/67 (02/11 0442) SpO2:  [94 %-97 %] 97 % (02/11 0442)    Intake/Output from previous day: 02/10 0701 - 02/11 0700 In: 1620.8 [P.O.:240; I.V.:1130.8; IV Piggyback:250] Out: 210 [Urine:100; Drains:80; Blood:30] Intake/Output this shift: No intake/output data recorded.  PE: General: pleasant, WD, male who is laying in bed in NAD HEENT: head is normocephalic, atraumatic. Mouth is pink and moist Heart: regular, rate, and rhythm.  Palpable radial pulses bilaterally Lungs:  Respiratory effort nonlabored Abd: soft, +BS, mild distension. Mild TTP greatest near incision and over suprapubic region. No rebound or guarding. JP with scant bloody fluid. Midline incision with surgical glue intact. No erythema or discharge MSK: all 4 extremities are symmetrical with no cyanosis, clubbing, or edema. Skin: warm and dry with no masses, lesions, or rashes Psych: A&Ox3 with an appropriate affect.    Lab Results:  Recent Labs    08/11/21 1443 08/12/21 0217  WBC 9.1 11.8*  HGB 12.4* 12.6*  HCT 39.0 40.1  PLT 188 230   BMET Recent Labs    08/11/21 1443 08/12/21 0217  NA  --  139  K  --  4.6  CL  --  105  CO2  --  24  GLUCOSE  --  130*  BUN  --  11  CREATININE 0.85 0.81  CALCIUM  --  9.1   PT/INR No results for input(s): LABPROT, INR in the last 72 hours. CMP     Component Value Date/Time   NA 139 08/12/2021 0217   K 4.6 08/12/2021 0217   CL 105 08/12/2021 0217   CO2 24 08/12/2021 0217   GLUCOSE 130 (H) 08/12/2021 0217   BUN 11 08/12/2021 0217   CREATININE 0.81 08/12/2021 0217   CALCIUM 9.1  08/12/2021 0217   PROT 5.6 (L) 06/05/2021 0403   ALBUMIN 2.4 (L) 06/05/2021 0403   AST 62 (H) 06/05/2021 0403   ALT 168 (H) 06/05/2021 0403   ALKPHOS 204 (H) 06/05/2021 0403   BILITOT 0.4 06/05/2021 0403   GFRNONAA >60 08/12/2021 0217   Lipase     Component Value Date/Time   LIPASE 23 05/27/2021 1231       Studies/Results: No results found.  Anti-infectives: Anti-infectives (From admission, onward)    Start     Dose/Rate Route Frequency Ordered Stop   08/11/21 1015  metroNIDAZOLE (FLAGYL) IVPB 500 mg        500 mg 100 mL/hr over 60 Minutes Intravenous To Surgery 08/11/21 1000 08/11/21 1015   08/11/21 0715  ceFAZolin (ANCEF) IVPB 2g/100 mL premix        2 g 200 mL/hr over 30 Minutes Intravenous On call to O.R. 08/11/21 4536 08/11/21 0855   08/11/21 0705  ceFAZolin (ANCEF) 2-4 GM/100ML-% IVPB       Note to Pharmacy: Amanda Pea M: cabinet override      08/11/21 0705 08/11/21 0908        Assessment/Plan Gangrenous cholecystitis POD1 s/p Diagnostic laparoscopy, open subtotal fenestrating cholecystectomy - Dr. Freida Busman 2/10 - drain 80 ml SS, continue drain - continue clears this am - if tolerates breakfast then  can advance at lunch - Mylanta for reflux - encouraged ambulation, IS  FEN: CLD ADAT soft, LR @ 50 ml/hr ID: ancef/flagyl periop VTE: lovenox    LOS: 1 day   Eric Form, [facility] Surgery 08/12/2021, 7:58 AM Please see Amion for pager number during day hours 7:00am-4:30pm

## 2021-08-12 NOTE — Evaluation (Signed)
Physical Therapy Evaluation Patient Details Name: Andrew Duncan MRN: [id number] DOB: 04/14/1946 Today's Date: 08/12/2021  History of Present Illness  Pt. is 76 yr old M admitted on 08/11/21 for planned cholecystectomy. PMH: anemia, DM, HTN, polyneuropathy  Clinical Impression  Pt was previously independent with ADLs and gait prior to undergoing cholecystectomy.  Pt is currently able to amb hallway distances with mod I, pushing IV pole for minimal support.  Pt is not appropriate for skilled PT at this time, and has been referred to mobility team for continued functional mobility.  All necessary PT education has been completed during eval.  Pt will be safe to return home on D/C with help from spouse as needed.     Recommendations for follow up therapy are one component of a multi-disciplinary discharge planning process, led by the attending physician.  Recommendations may be updated based on patient status, additional functional criteria and insurance authorization.  Follow Up Recommendations No PT follow up    Assistance Recommended at Discharge Set up Supervision/Assistance  Patient can return home with the following  Assistance with cooking/housework;Assist for transportation;Help with stairs or ramp for entrance;A little help with walking and/or transfers    Equipment Recommendations None recommended by PT  Recommendations for Other Services       Functional Status Assessment Patient has had a recent decline in their functional status and demonstrates the ability to make significant improvements in function in a reasonable and predictable amount of time.     Precautions / Restrictions Precautions Precautions: None      Mobility  Bed Mobility Overal bed mobility: Needs Assistance Bed Mobility: Supine to Sit     Supine to sit: Min assist     General bed mobility comments: Min A for HH support to transfer to EOB.  Educated on rolling to side to improve comfort with transition  and pt practices reverse log roll BTB. Patient Response: Cooperative  Transfers Overall transfer level: Independent                 General transfer comment: Able to stand EOB without difficulty.    Ambulation/Gait Ambulation/Gait assistance: Supervision Gait Distance (Feet): 600 Feet Assistive device: IV Pole Gait Pattern/deviations: Narrow base of support       General Gait Details: Pt. amb x 1 lap around NSG floor, pushes IV pole.  Pt. has NBOS with gait with ankle inversion but states that this is normal for him.  No LOB with activity.  Stairs            Wheelchair Mobility    Modified Rankin (Stroke Patients Only)       Balance Overall balance assessment: Independent                                           Pertinent Vitals/Pain Pain Assessment Pain Assessment: 0-10 Pain Score: 2  Pain Location: Abdomen Pain Descriptors / Indicators: Aching, Tender, Discomfort Pain Intervention(s): Limited activity within patient's tolerance, Monitored during session    Home Living Family/patient expects to be discharged to:: Private residence Living Arrangements: Spouse/significant other Available Help at Discharge: Family;Available PRN/intermittently Type of Home: House Home Access: Stairs to enter Entrance Stairs-Rails: None Entrance Stairs-Number of Steps: 2 Alternate Level Stairs-Number of Steps: Flight Home Layout: Two level Home Equipment: None      Prior Function Prior Level of Function : Independent/Modified Independent;Working/employed  Mobility Comments: Works as a professor       Higher education careers adviser        Extremity/Trunk Assessment   Upper Extremity Assessment Upper Extremity Assessment: Overall WFL for tasks assessed    Lower Extremity Assessment Lower Extremity Assessment: Overall WFL for tasks assessed       Communication   Communication: No difficulties  Cognition Arousal/Alertness:  Awake/alert Behavior During Therapy: WFL for tasks assessed/performed Overall Cognitive Status: Within Functional Limits for tasks assessed                                 General Comments: Pt. is supine in bed when PT arrives.  Spouse and son present.  Agreeable to work with PT.  Alert and oriented x 3.        General Comments      Exercises     Assessment/Plan    PT Assessment Patient does not need any further PT services (Will refer to mobility team)  PT Problem List         PT Treatment Interventions      PT Goals (Current goals can be found in the Care Plan section)  Acute Rehab PT Goals Patient Stated Goal: Pt's goal is to return to independence PT Goal Formulation: With patient Time For Goal Achievement: 08/26/21 Potential to Achieve Goals: Good    Frequency       Co-evaluation               AM-PAC PT "6 Clicks" Mobility  Outcome Measure Help needed turning from your back to your side while in a flat bed without using bedrails?: None Help needed moving from lying on your back to sitting on the side of a flat bed without using bedrails?: A Little Help needed moving to and from a bed to a chair (including a wheelchair)?: None Help needed standing up from a chair using your arms (e.g., wheelchair or bedside chair)?: None Help needed to walk in hospital room?: None Help needed climbing 3-5 steps with a railing? : A Little 6 Click Score: 22    End of Session Equipment Utilized During Treatment: Gait belt Activity Tolerance: Patient tolerated treatment well Patient left: in bed;with family/visitor present;with call bell/phone within reach Nurse Communication: Mobility status PT Visit Diagnosis: Other abnormalities of gait and mobility (R26.89)    Time: 6962-9528 PT Time Calculation (min) (ACUTE ONLY): 24 min   Charges:   PT Evaluation $PT Eval Low Complexity: 1 Low PT Treatments $Therapeutic Activity: 8-22 mins       Andrew Duncan A.  Andrew Duncan, PT, DPT Acute Rehabilitation Services Office: [phone]   Andrew Duncan A Andrew Duncan 08/12/2021, 2:54 PM

## 2021-08-12 NOTE — Progress Notes (Signed)
Physical Therapy Discharge Patient Details Name: Andrew Duncan MRN: [id number] DOB: [birth year] Today's Date: 08/12/2021 Time: [id number] PT Time Calculation (min) (ACUTE ONLY): 24 min  Patient discharged from PT services secondary to  pt mobilizing with mod I.  No further skilled PT required in acute care.  Safe to mobilize with mobility specialist team .  Please see latest therapy progress note for current level of functioning and progress toward goals.    Progress and discharge plan discussed with patient and/or caregiver: Patient/Caregiver agrees with plan  GP    [name] A. Eveline Sauve, PT, DPT Acute Rehabilitation Services Office: [place] 08/12/2021, 2:57 PM

## 2021-08-12 NOTE — Progress Notes (Signed)
Pt declined diet advancement due to episodes of nausea and vomitting. Medicated with prn maalox and zofran for indigestion and nausea

## 2021-08-13 LAB — GLUCOSE, CAPILLARY
Glucose-Capillary: 179 mg/dL — ABNORMAL HIGH (ref 70–99)
Glucose-Capillary: 178 mg/dL — ABNORMAL HIGH (ref 70–99)
Glucose-Capillary: 189 mg/dL — ABNORMAL HIGH (ref 70–99)
Glucose-Capillary: 210 mg/dL — ABNORMAL HIGH (ref 70–99)

## 2021-08-13 MED ORDER — ALBUTEROL SULFATE (2.5 MG/3ML) 0.083% IN NEBU
3.0000 mL | INHALATION_SOLUTION | RESPIRATORY_TRACT | Status: DC | PRN
  Filled 2021-08-13: qty 3

## 2021-08-13 NOTE — Progress Notes (Addendum)
Mobility Specialist: Progress Note   08/13/21 1051  Mobility  Activity Ambulated independently in hallway  Level of Assistance Minimal assist, patient does 75% or more  Assistive Device  (IV Pole)  Distance Ambulated (ft) 550 ft  Activity Response Tolerated well  $Mobility charge 1 Mobility   Pt required minA to sit EOB but was independent with mobility. C/o abdominal pain during ambulation, no rating given. Pt back to bed with call bell and phone at his side.  [facility] Natina Wiginton Mobility Specialist Mobility Specialist 5 North: [phone] Mobility Specialist 6 North: [phone]

## 2021-08-13 NOTE — Progress Notes (Signed)
Progress Note  2 Days Post-Op  Subjective: Had nausea and emesis overnight on clear liquids. Abdominal pain is stable. No nausea so far this am but has not had breakfast. Not passing flatus. Has new mild cough and congestion  Objective: Vital signs in last 24 hours: Temp:  [97.9 F (36.6 C)-98.6 F (37 C)] 98.6 F (37 C) (02/12 0743) Pulse Rate:  [97-104] 97 (02/12 0743) Resp:  [16-18] 18 (02/12 0743) BP: (125-162)/(62-84) 147/84 (02/12 0743) SpO2:  [91 %-98 %] 93 % (02/12 0743)    Intake/Output from previous day: 02/11 0701 - 02/12 0700 In: 120 [P.O.:120] Out: 1180 [Urine:1100; Drains:80] Intake/Output this shift: No intake/output data recorded.  PE: General: pleasant, WD, male who is laying in bed in NAD HEENT: head is normocephalic, atraumatic. Mouth is pink and moist Heart: regular, rate, and rhythm.  Palpable radial pulses bilaterally Lungs:  Respiratory effort nonlabored Abd: soft, +BS, mild distension. Mild TTP greatest near incision and over suprapubic region. No rebound or guarding. JP with scant bloody fluid. Midline incision with surgical glue intact. No erythema or discharge MSK: all 4 extremities are symmetrical Skin: warm and dry with no masses, lesions, or rashes Psych: A&Ox3 with an appropriate affect.    Lab Results:  Recent Labs    08/11/21 1443 08/12/21 0217  WBC 9.1 11.8*  HGB 12.4* 12.6*  HCT 39.0 40.1  PLT 188 230    BMET Recent Labs    08/11/21 1443 08/12/21 0217  NA  --  139  K  --  4.6  CL  --  105  CO2  --  24  GLUCOSE  --  130*  BUN  --  11  CREATININE 0.85 0.81  CALCIUM  --  9.1    PT/INR No results for input(s): LABPROT, INR in the last 72 hours. CMP     Component Value Date/Time   NA 139 08/12/2021 0217   K 4.6 08/12/2021 0217   CL 105 08/12/2021 0217   CO2 24 08/12/2021 0217   GLUCOSE 130 (H) 08/12/2021 0217   BUN 11 08/12/2021 0217   CREATININE 0.81 08/12/2021 0217   CALCIUM 9.1 08/12/2021 0217   PROT 5.6  (L) 06/05/2021 0403   ALBUMIN 2.4 (L) 06/05/2021 0403   AST 62 (H) 06/05/2021 0403   ALT 168 (H) 06/05/2021 0403   ALKPHOS 204 (H) 06/05/2021 0403   BILITOT 0.4 06/05/2021 0403   GFRNONAA >60 08/12/2021 0217   Lipase     Component Value Date/Time   LIPASE 23 05/27/2021 1231       Studies/Results: No results found.  Anti-infectives: Anti-infectives (From admission, onward)    Start     Dose/Rate Route Frequency Ordered Stop   08/11/21 1015  metroNIDAZOLE (FLAGYL) IVPB 500 mg        500 mg 100 mL/hr over 60 Minutes Intravenous To Surgery 08/11/21 1000 08/11/21 1015   08/11/21 0715  ceFAZolin (ANCEF) IVPB 2g/100 mL premix        2 g 200 mL/hr over 30 Minutes Intravenous On call to O.R. 08/11/21 [id number] 08/11/21 0855   08/11/21 0705  ceFAZolin (ANCEF) 2-4 GM/100ML-% IVPB       Note to Pharmacy: Wendall Mola M: cabinet override      08/11/21 0705 08/11/21 0908        Assessment/Plan Gangrenous cholecystitis POD2 s/p Diagnostic laparoscopy, open subtotal fenestrating cholecystectomy - Dr. Zenia Resides 2/10 - drain 80 ml SS, continue drain - continue clears this am. Advance as tolerated  to fulls lunch - Mylanta for reflux - encouraged ambulation, IS  Cough/congestion -states he uses inhaler at home - do not see one listed in home meds. Have ordered IS and albuterol inhaler prn  FEN: CLD ADAT FLD, LR @ 50 ml/hr ID: ancef/flagyl periop VTE: lovenox    LOS: 2 days   Winferd Humphrey, [facility] Surgery 08/13/2021, 7:46 AM Please see Amion for pager number during day hours 7:00am-4:30pm

## 2021-08-13 NOTE — Progress Notes (Signed)
Mobility Specialist: Progress Note   08/13/21 1705  Mobility  Activity Ambulated independently in hallway  Level of Assistance Independent  Assistive Device  (IV Pole)  Distance Ambulated (ft) 825 ft  Activity Response Tolerated well  $Mobility charge 1 Mobility   Received pt in bed having no complaints and agreeable to mobility. Asymptomatic throughout ambulation, returned back to bed w/ call bell in reach and all needs met.  [facility] Tassie Pollett Mobility Specialist Mobility Specialist 5 North: [phone] Mobility Specialist 6 North: [phone]

## 2021-08-14 LAB — GLUCOSE, CAPILLARY
Glucose-Capillary: 157 mg/dL — ABNORMAL HIGH (ref 70–99)
Glucose-Capillary: 145 mg/dL — ABNORMAL HIGH (ref 70–99)
Glucose-Capillary: 174 mg/dL — ABNORMAL HIGH (ref 70–99)
Glucose-Capillary: 233 mg/dL — ABNORMAL HIGH (ref 70–99)

## 2021-08-14 LAB — SURGICAL PATHOLOGY

## 2021-08-14 MED ORDER — POLYETHYLENE GLYCOL 3350 17 G PO PACK
17.0000 g | PACK | Freq: Every day | ORAL | Status: DC
  Administered 2021-08-14 – 2021-08-15 (×2): 17 g via ORAL
  Filled 2021-08-14 (×2): qty 1

## 2021-08-14 MED ORDER — INSULIN GLARGINE-YFGN 100 UNIT/ML ~~LOC~~ SOLN
40.0000 [IU] | Freq: Every day | SUBCUTANEOUS | Status: DC
  Administered 2021-08-14: 40 [IU] via SUBCUTANEOUS
  Filled 2021-08-14 (×2): qty 0.4

## 2021-08-14 MED ORDER — BISACODYL 10 MG RE SUPP
10.0000 mg | Freq: Once | RECTAL | Status: AC
  Administered 2021-08-14: 10 mg via RECTAL
  Filled 2021-08-14: qty 1

## 2021-08-14 NOTE — TOC Progression Note (Signed)
Transition of Care [facility] - Progression Note    Patient Details  Name: Andrew Duncan MRN: [id number] Date of Birth: [birth year]  Transition of Care [facility]) CM/SW Contact  Faylynn Stamos, Adria Devon, RN Phone Number: 08/14/2021, 12:17 PM  Clinical Narrative:      Transition of Care [facility]) Screening Note   Patient Details  Name: Andrew Duncan Date of Birth: 15-Oct-1945     Transition of Care Department [facility]) has reviewed patient and no TOC needs have been identified at this time. We will continue to monitor patient advancement through interdisciplinary progression rounds. If new patient transition needs arise, please place a TOC consult.         Expected Discharge Plan and Services                                                 Social Determinants of Health (SDOH) Interventions    Readmission Risk Interventions No flowsheet data found.

## 2021-08-14 NOTE — Progress Notes (Signed)
Mobility Specialist Progress Note:   08/14/21 1031  Mobility  Activity Ambulated with assistance in hallway  Level of Assistance Independent after set-up  Assistive Device None (IV pole)  Distance Ambulated (ft) 825 ft  Activity Response Tolerated well  $Mobility charge 1 Mobility   Pt received EOB willing to participate in mobility. No complaints of pain. Pt left EOB with call bell in reach and all needs met.   [facility] Public librarian Phone [phone]

## 2021-08-14 NOTE — Progress Notes (Signed)
Mobility Specialist Progress Note: ° ° 08/14/21 1615  °Mobility  °Activity Ambulated with assistance in hallway  °Level of Assistance Independent after set-up  °Assistive Device None  °Distance Ambulated (ft) 825 ft  °Activity Response Tolerated well  °$Mobility charge 1 Mobility  ° °Pt received in bed willing to participate in mobility. No complaints of pain but states his abdomen feels tight. Left in bed with call bell in reach, all needs met and wife present.  ° °Andrew Duncan °Mobility Specialist °Primary Phone [phone] ° °

## 2021-08-14 NOTE — Progress Notes (Signed)
Mobility Specialist Progress Note:   08/14/21 0954  Mobility  Activity Ambulated with assistance to bathroom  Level of Assistance Independent after set-up  Assistive Device None  Distance Ambulated (ft) 20 ft  Activity Response Tolerated well  $Mobility charge 1 Mobility   Pt received in bed. Stated he wanted to use the bathroom before ambulating in hallway. Pt left in bathroom and was instructed to pull bathroom string when finished.   [facility] Designer, jewellery Phone [phone]

## 2021-08-14 NOTE — Progress Notes (Signed)
° °  Progress Note  3 Days Post-Op  Subjective: Nausea improving, tolerating some soft food. Passing flatus but no BM yet. Ambulated twice yesterday.  Objective: Vital signs in last 24 hours: Temp:  [98.1 F (36.7 C)-98.5 F (36.9 C)] 98.4 F (36.9 C) (02/13 0723) Pulse Rate:  [92-100] 99 (02/13 0723) Resp:  [16-18] 16 (02/13 0723) BP: (161-175)/(81-89) 166/86 (02/13 0723) SpO2:  [94 %-96 %] 95 % (02/13 0723)    Intake/Output from previous day: 02/12 0701 - 02/13 0700 In: 940 [P.O.:940] Out: 820 [Urine:800; Drains:20] Intake/Output this shift: No intake/output data recorded.  PE: General: resting comfortably, NAD Neuro: alert and oriented, no focal deficits Resp: normal work of breathing on room air Abdomen: mildly distended but soft, midline incision clean and dry, RUQ JP with serosanguinous drainage. Extremities: warm and well-perfused    Lab Results:  Recent Labs    08/11/21 1443 08/12/21 0217  WBC 9.1 11.8*  HGB 12.4* 12.6*  HCT 39.0 40.1  PLT 188 230   BMET Recent Labs    08/11/21 1443 08/12/21 0217  NA  --  139  K  --  4.6  CL  --  105  CO2  --  24  GLUCOSE  --  130*  BUN  --  11  CREATININE 0.85 0.81  CALCIUM  --  9.1   PT/INR No results for input(s): LABPROT, INR in the last 72 hours. CMP     Component Value Date/Time   NA 139 08/12/2021 0217   K 4.6 08/12/2021 0217   CL 105 08/12/2021 0217   CO2 24 08/12/2021 0217   GLUCOSE 130 (H) 08/12/2021 0217   BUN 11 08/12/2021 0217   CREATININE 0.81 08/12/2021 0217   CALCIUM 9.1 08/12/2021 0217   PROT 5.6 (L) 06/05/2021 0403   ALBUMIN 2.4 (L) 06/05/2021 0403   AST 62 (H) 06/05/2021 0403   ALT 168 (H) 06/05/2021 0403   ALKPHOS 204 (H) 06/05/2021 0403   BILITOT 0.4 06/05/2021 0403   GFRNONAA >60 08/12/2021 0217   Lipase     Component Value Date/Time   LIPASE 23 05/27/2021 1231       Studies/Results: No results found.      Assessment/Plan Gangrenous cholecystitis POD3 s/p  Diagnostic laparoscopy, open subtotal fenestrating cholecystectomy. - Carb control diet, SLIV - Bowel regimen: miralax daily and colace BID. - Mylanta prn for reflux - Ambulate, IS - Home medications as appropriate - DM: Increase lantus to home dose of 40U qhs, continue sliding scale. - VTE: lovenox - Dispo: med-surg floor, anticipate discharge home tomorrow morning.    LOS: 3 days   Fritzi Mandes, MD [facility] Surgery 08/14/2021, 8:15 AM

## 2021-08-15 LAB — GLUCOSE, CAPILLARY
Glucose-Capillary: 100 mg/dL — ABNORMAL HIGH (ref 70–99)
Glucose-Capillary: 228 mg/dL — ABNORMAL HIGH (ref 70–99)

## 2021-08-15 MED ORDER — MAGNESIUM HYDROXIDE 400 MG/5ML PO SUSP
15.0000 mL | Freq: Once | ORAL | Status: AC
  Administered 2021-08-15: 15 mL via ORAL
  Filled 2021-08-15: qty 30

## 2021-08-15 MED ORDER — OXYCODONE HCL 5 MG PO TABS: 5.0000 mg | ORAL_TABLET | Freq: Four times a day (QID) | ORAL | 0 refills | Status: AC | PRN

## 2021-08-15 MED ORDER — BISACODYL 10 MG RE SUPP
10.0000 mg | Freq: Once | RECTAL | Status: AC
  Administered 2021-08-15: 10 mg via RECTAL
  Filled 2021-08-15: qty 1

## 2021-08-15 MED ORDER — ACETAMINOPHEN 500 MG PO TABS: 500.0000 mg | ORAL_TABLET | Freq: Four times a day (QID) | ORAL | 0 refills | Status: AC | PRN

## 2021-08-15 MED ORDER — ONDANSETRON 4 MG PO TBDP: 4.0000 mg | ORAL_TABLET | Freq: Four times a day (QID) | ORAL | 0 refills | Status: AC | PRN

## 2021-08-15 MED ORDER — DOCUSATE SODIUM 100 MG PO CAPS: 100.0000 mg | ORAL_CAPSULE | Freq: Two times a day (BID) | ORAL | 0 refills | Status: AC

## 2021-08-15 MED ORDER — POLYETHYLENE GLYCOL 3350 17 G PO PACK: 17.0000 g | PACK | Freq: Every day | ORAL | 0 refills | Status: AC

## 2021-08-15 NOTE — Discharge Summary (Signed)
Physician Discharge Summary   Patient ID: Andrew Duncan [id number] 76 y.o. 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 is a 76 yo male who was admitted in early December with perforated cholecystitis with an associated liver abscess, with a concurrent pneumonia. He was managed with a percutaneous cholecystostomy and was later evaluated as an outpatient for elective cholecystectomy.  A cholangiogram showed persistent occlusion of the cystic duct.  After discussion of the risks and benefits of surgery, he agreed to proceed with cholecystectomy.  Hospital Course: The patient was taken to the operating room on 08/11/2021 for a laparoscopic converted to open subtotal fenestrating cholecystectomy.  Drain was left in place intraoperatively.  For further details of the procedure please see separately dictated operative note.  Postoperatively he was admitted to the floor in stable condition.  He was started on a clear liquid diet and had some nausea on POD1, which subsequently resolved.  He was advanced to full liquids and then a soft diet on postop day 2 which he tolerated.  He had return of bowel function with flatus.  He was able to ambulate independently and his drain remained serosanguineous.  On POD 4, he was tolerating a diet, his pain was controlled with oral medications, and he was passing flatus and ambulating.  He was examined and deemed appropriate for discharge home.  His drain is to remain in place at discharge and he will follow-up in clinic in 1 week for drain check.  Consults: None  Significant Diagnostic Studies: N/A  Treatments: IV hydration, analgesia: acetaminophen, Dilaudid, and  oxycodone, and surgery: Diagnostic laparoscopy, open subtotal fenestrating cholecystectomy  Discharge Exam: General: resting comfortably, NAD Neuro: alert and oriented, no focal deficits Resp: normal work of breathing on room air Abdomen: soft, nondistended, midline incision clean and dry, RUQ JP with serosanguinous drainage. Extremities: warm and well-perfused  Disposition: Discharge disposition: 01-Home or Self Care       Patient Instructions:  Allergies as of 08/15/2021       Reactions   Versed [midazolam]    Unknown reaction        Medication List     STOP taking these medications    Normal Saline Flush 0.9 % Soln       TAKE these medications    acetaminophen 500 MG tablet Commonly known as: TYLENOL Take 1 tablet (500 mg total) by mouth every 6 (six) hours as needed for mild pain.   aspirin EC 81 MG tablet Take 81 mg by mouth daily.   BD Pen Needle Nano U/F 32G X 4 MM Misc Generic drug: Insulin Pen Needle USE NEEDLES FOR INJECTION TWICE A DAY   cholecalciferol 25 MCG (1000 UNIT) tablet Commonly known as: VITAMIN D Take 1,000 Units by mouth daily.   Compdress Island Dressing 4"x4 Pads 1 each by Does not apply route daily.   docusate sodium 100 MG capsule Commonly known as: COLACE Take 1 capsule (100 mg total) by mouth 2 (two) times daily.   glipiZIDE 10 MG 24 hr tablet Commonly known as: GLUCOTROL XL Take 10 mg by mouth daily.   Januvia 100 MG tablet Generic drug: sitaGLIPtin Take 100 mg by mouth daily.   Lantus SoloStar 100 UNIT/ML Solostar Pen  Generic drug: insulin glargine 40 Units at bedtime.   latanoprost 0.005 % ophthalmic solution Commonly known as: XALATAN Place 1 drop into both eyes at bedtime.   losartan 25 MG tablet Commonly known as: COZAAR Take 25 mg by mouth daily.   metFORMIN 500 MG tablet Commonly known as: GLUCOPHAGE Take 500 mg by mouth 2 (two) times daily with a meal.   multivitamin with minerals Tabs  tablet Take 1 tablet by mouth daily.   ondansetron 4 MG disintegrating tablet Commonly known as: ZOFRAN-ODT Take 1 tablet (4 mg total) by mouth every 6 (six) hours as needed for nausea.   oxyCODONE 5 MG immediate release tablet Commonly known as: Oxy IR/ROXICODONE Take 1 tablet (5 mg total) by mouth every 6 (six) hours as needed for severe pain.   Ozempic (0.25 or 0.5 MG/DOSE) 2 MG/1.5ML Sopn Generic drug: Semaglutide(0.25 or 0.5MG /DOS) Inject 0.5 mg into the skin once a week.   polyethylene glycol 17 g packet Commonly known as: MIRALAX / GLYCOLAX Take 17 g by mouth daily.   pravastatin 20 MG tablet Commonly known as: PRAVACHOL Take 20 mg by mouth daily.   timolol 0.5 % ophthalmic solution Commonly known as: TIMOPTIC Place 1 drop into both eyes 2 (two) times daily.       Activity: no driving for 2 weeks and no heavy lifting for 6 weeks Diet: regular diet Wound Care: keep wound clean and dry  Follow-up in 1 week for drain check.  Signed: Fritzi Mandes 08/16/2021 11:59 AM

## 2021-08-15 NOTE — Care Management Important Message (Signed)
Important Message  Patient Details  Name: Andrew Duncan MRN: [id number] Date of Birth: 1946-04-11   Medicare Important Message Given:  Yes     Hannah Beat 08/15/2021, 10:09 AM

## 2021-08-15 NOTE — Progress Notes (Signed)
Patient ride is here, discharged via wheelchair to car.

## 2021-08-15 NOTE — Progress Notes (Signed)
Mobility Specialist Progress Note:   08/15/21 1005  Mobility  Activity Ambulated with assistance in hallway  Level of Assistance Independent  Assistive Device None  Distance Ambulated (ft) 570 ft  Activity Response Tolerated well  $Mobility charge 1 Mobility   Pt received in bed willing to participate in mobility. Complaints off 4/10 abdomen pain. Pt left in bed with call bell in reach and all needs met.   [facility] Public librarian Phone [phone]

## 2021-08-15 NOTE — Discharge Instructions (Addendum)
CENTRAL  SURGERY DISCHARGE INSTRUCTIONS  Activity No heavy lifting greater than 15 pounds for 6 weeks after surgery. Ok to shower in 24 hours, but do not bathe or submerge incisions underwater. Do not drive while taking narcotic pain medication.  Wound Care Your incision is covered with skin glue called Dermabond. This will peel off on its own over time. You may shower and allow warm soapy water to run over your incisions. Gently pat dry. Do not submerge your incision underwater. Monitor your incision for any new redness, tenderness, or drainage.  When to Call us: Fever greater than 100.5 New redness, drainage, or swelling at incision site Severe pain, nausea, or vomiting Jaundice (yellowing of the whites of the eyes or skin) Green or bright yellow drainage color  JP DRAIN CARE INSTRUCTIONS Wash your hands prior to caring for your drain. Uncap the bulb to release the suction. Pour out the bulb contents into a measuring cup and record the amount. Squeeze the bulb and replace the cap to apply suction again. You should empty your drain each time you notice the bulb is full. Empty at least once a day and more often when the bulb fills up.  Please keep a daily log of your drain output and bring this with you when you come to clinic.   Follow-up You will have an appointment to have your drain checked on Tuesday, February 21. You will then have an appointment scheduled with Dr. Zenia Resides on 08/30/21 at 9:20am. This will be at the [facility] Surgery office at [address], [place], [place], [place]. Please arrive at least 15 minutes prior to your scheduled appointment time.  For questions or concerns, please call the office at [phone].

## 2021-08-28 ENCOUNTER — Other Ambulatory Visit (HOSPITAL_COMMUNITY): Payer: Medicare Other

## 2021-08-28 ENCOUNTER — Inpatient Hospital Stay (HOSPITAL_COMMUNITY): Admission: RE | Admit: 2021-08-28 | Payer: Medicare Other | Source: Ambulatory Visit

## 2021-10-20 DIAGNOSIS — I10 Essential (primary) hypertension: Secondary | ICD-10-CM | POA: Diagnosis not present

## 2021-10-20 DIAGNOSIS — E1142 Type 2 diabetes mellitus with diabetic polyneuropathy: Secondary | ICD-10-CM | POA: Diagnosis not present

## 2021-10-20 DIAGNOSIS — E113492 Type 2 diabetes mellitus with severe nonproliferative diabetic retinopathy without macular edema, left eye: Secondary | ICD-10-CM | POA: Diagnosis not present

## 2021-10-20 DIAGNOSIS — E7800 Pure hypercholesterolemia, unspecified: Secondary | ICD-10-CM | POA: Diagnosis not present

## 2021-10-20 DIAGNOSIS — I700 Atherosclerosis of aorta: Secondary | ICD-10-CM | POA: Diagnosis not present

## 2021-10-20 DIAGNOSIS — Z794 Long term (current) use of insulin: Secondary | ICD-10-CM | POA: Diagnosis not present

## 2022-02-09 DIAGNOSIS — H02403 Unspecified ptosis of bilateral eyelids: Secondary | ICD-10-CM | POA: Diagnosis not present

## 2022-02-09 DIAGNOSIS — H40023 Open angle with borderline findings, high risk, bilateral: Secondary | ICD-10-CM | POA: Diagnosis not present

## 2022-02-14 DIAGNOSIS — Z961 Presence of intraocular lens: Secondary | ICD-10-CM | POA: Diagnosis not present

## 2022-02-14 DIAGNOSIS — H3582 Retinal ischemia: Secondary | ICD-10-CM | POA: Diagnosis not present

## 2022-02-14 DIAGNOSIS — E113311 Type 2 diabetes mellitus with moderate nonproliferative diabetic retinopathy with macular edema, right eye: Secondary | ICD-10-CM | POA: Diagnosis not present

## 2022-02-14 DIAGNOSIS — E113312 Type 2 diabetes mellitus with moderate nonproliferative diabetic retinopathy with macular edema, left eye: Secondary | ICD-10-CM | POA: Diagnosis not present

## 2022-02-14 DIAGNOSIS — H43392 Other vitreous opacities, left eye: Secondary | ICD-10-CM | POA: Diagnosis not present

## 2022-02-14 DIAGNOSIS — H35033 Hypertensive retinopathy, bilateral: Secondary | ICD-10-CM | POA: Diagnosis not present

## 2022-02-14 DIAGNOSIS — E113313 Type 2 diabetes mellitus with moderate nonproliferative diabetic retinopathy with macular edema, bilateral: Secondary | ICD-10-CM | POA: Diagnosis not present

## 2022-02-26 DIAGNOSIS — E113312 Type 2 diabetes mellitus with moderate nonproliferative diabetic retinopathy with macular edema, left eye: Secondary | ICD-10-CM | POA: Diagnosis not present

## 2022-03-21 DIAGNOSIS — E113311 Type 2 diabetes mellitus with moderate nonproliferative diabetic retinopathy with macular edema, right eye: Secondary | ICD-10-CM | POA: Diagnosis not present

## 2022-03-26 DIAGNOSIS — Z23 Encounter for immunization: Secondary | ICD-10-CM | POA: Diagnosis not present

## 2022-04-04 DIAGNOSIS — Z23 Encounter for immunization: Secondary | ICD-10-CM | POA: Diagnosis not present

## 2022-04-23 DIAGNOSIS — E113312 Type 2 diabetes mellitus with moderate nonproliferative diabetic retinopathy with macular edema, left eye: Secondary | ICD-10-CM | POA: Diagnosis not present

## 2022-04-24 DIAGNOSIS — D649 Anemia, unspecified: Secondary | ICD-10-CM | POA: Diagnosis not present

## 2022-04-24 DIAGNOSIS — Z0000 Encounter for general adult medical examination without abnormal findings: Secondary | ICD-10-CM | POA: Diagnosis not present

## 2022-04-24 DIAGNOSIS — E1142 Type 2 diabetes mellitus with diabetic polyneuropathy: Secondary | ICD-10-CM | POA: Diagnosis not present

## 2022-04-24 DIAGNOSIS — E7800 Pure hypercholesterolemia, unspecified: Secondary | ICD-10-CM | POA: Diagnosis not present

## 2022-04-24 DIAGNOSIS — E113492 Type 2 diabetes mellitus with severe nonproliferative diabetic retinopathy without macular edema, left eye: Secondary | ICD-10-CM | POA: Diagnosis not present

## 2022-04-24 DIAGNOSIS — Z79899 Other long term (current) drug therapy: Secondary | ICD-10-CM | POA: Diagnosis not present

## 2022-04-24 DIAGNOSIS — I10 Essential (primary) hypertension: Secondary | ICD-10-CM | POA: Diagnosis not present

## 2022-05-04 DIAGNOSIS — H40023 Open angle with borderline findings, high risk, bilateral: Secondary | ICD-10-CM | POA: Diagnosis not present

## 2022-05-09 DIAGNOSIS — E113311 Type 2 diabetes mellitus with moderate nonproliferative diabetic retinopathy with macular edema, right eye: Secondary | ICD-10-CM | POA: Diagnosis not present

## 2022-05-14 DIAGNOSIS — D649 Anemia, unspecified: Secondary | ICD-10-CM | POA: Diagnosis not present

## 2022-05-22 DIAGNOSIS — E559 Vitamin D deficiency, unspecified: Secondary | ICD-10-CM | POA: Diagnosis not present

## 2022-05-22 DIAGNOSIS — D649 Anemia, unspecified: Secondary | ICD-10-CM | POA: Diagnosis not present

## 2022-06-04 DIAGNOSIS — E113312 Type 2 diabetes mellitus with moderate nonproliferative diabetic retinopathy with macular edema, left eye: Secondary | ICD-10-CM | POA: Diagnosis not present

## 2022-07-30 DIAGNOSIS — E113312 Type 2 diabetes mellitus with moderate nonproliferative diabetic retinopathy with macular edema, left eye: Secondary | ICD-10-CM | POA: Diagnosis not present

## 2022-08-06 DIAGNOSIS — E113311 Type 2 diabetes mellitus with moderate nonproliferative diabetic retinopathy with macular edema, right eye: Secondary | ICD-10-CM | POA: Diagnosis not present

## 2022-09-05 DIAGNOSIS — H5213 Myopia, bilateral: Secondary | ICD-10-CM | POA: Diagnosis not present

## 2022-09-05 DIAGNOSIS — E113313 Type 2 diabetes mellitus with moderate nonproliferative diabetic retinopathy with macular edema, bilateral: Secondary | ICD-10-CM | POA: Diagnosis not present

## 2022-09-05 DIAGNOSIS — H26493 Other secondary cataract, bilateral: Secondary | ICD-10-CM | POA: Diagnosis not present

## 2022-09-05 DIAGNOSIS — H40023 Open angle with borderline findings, high risk, bilateral: Secondary | ICD-10-CM | POA: Diagnosis not present

## 2022-09-05 DIAGNOSIS — H04123 Dry eye syndrome of bilateral lacrimal glands: Secondary | ICD-10-CM | POA: Diagnosis not present

## 2022-09-05 DIAGNOSIS — H524 Presbyopia: Secondary | ICD-10-CM | POA: Diagnosis not present

## 2022-10-03 DIAGNOSIS — E113311 Type 2 diabetes mellitus with moderate nonproliferative diabetic retinopathy with macular edema, right eye: Secondary | ICD-10-CM | POA: Diagnosis not present

## 2022-10-12 DIAGNOSIS — E113312 Type 2 diabetes mellitus with moderate nonproliferative diabetic retinopathy with macular edema, left eye: Secondary | ICD-10-CM | POA: Diagnosis not present

## 2022-10-24 DIAGNOSIS — I700 Atherosclerosis of aorta: Secondary | ICD-10-CM | POA: Diagnosis not present

## 2022-10-24 DIAGNOSIS — H6120 Impacted cerumen, unspecified ear: Secondary | ICD-10-CM | POA: Diagnosis not present

## 2022-10-24 DIAGNOSIS — E1142 Type 2 diabetes mellitus with diabetic polyneuropathy: Secondary | ICD-10-CM | POA: Diagnosis not present

## 2022-10-24 DIAGNOSIS — E113492 Type 2 diabetes mellitus with severe nonproliferative diabetic retinopathy without macular edema, left eye: Secondary | ICD-10-CM | POA: Diagnosis not present

## 2022-10-24 DIAGNOSIS — I10 Essential (primary) hypertension: Secondary | ICD-10-CM | POA: Diagnosis not present

## 2022-10-24 DIAGNOSIS — Z5181 Encounter for therapeutic drug level monitoring: Secondary | ICD-10-CM | POA: Diagnosis not present

## 2022-10-24 DIAGNOSIS — Z794 Long term (current) use of insulin: Secondary | ICD-10-CM | POA: Diagnosis not present

## 2022-10-24 DIAGNOSIS — E7800 Pure hypercholesterolemia, unspecified: Secondary | ICD-10-CM | POA: Diagnosis not present

## 2022-10-26 DIAGNOSIS — H3582 Retinal ischemia: Secondary | ICD-10-CM | POA: Diagnosis not present

## 2022-10-26 DIAGNOSIS — H35033 Hypertensive retinopathy, bilateral: Secondary | ICD-10-CM | POA: Diagnosis not present

## 2022-10-26 DIAGNOSIS — Z961 Presence of intraocular lens: Secondary | ICD-10-CM | POA: Diagnosis not present

## 2022-10-26 DIAGNOSIS — E113313 Type 2 diabetes mellitus with moderate nonproliferative diabetic retinopathy with macular edema, bilateral: Secondary | ICD-10-CM | POA: Diagnosis not present

## 2022-11-01 DIAGNOSIS — H6123 Impacted cerumen, bilateral: Secondary | ICD-10-CM | POA: Diagnosis not present

## 2022-11-21 DIAGNOSIS — E113311 Type 2 diabetes mellitus with moderate nonproliferative diabetic retinopathy with macular edema, right eye: Secondary | ICD-10-CM | POA: Diagnosis not present

## 2022-11-22 IMAGING — XA IR EXCHANGE BILARY DRAIN
2 series · 5 of 5 positions shown · IV contrast (IODINE)
Comparison: none

INDICATION: 75-year-old with history of acute cholecystitis. Cholecystostomy
tube was placed on 06/02/2021. Patient reports no output from the
drain and the drain is partially dislodged.

[Series 2: fl (-) angio · 4 of 30 frames shown]
[frame 5/30]
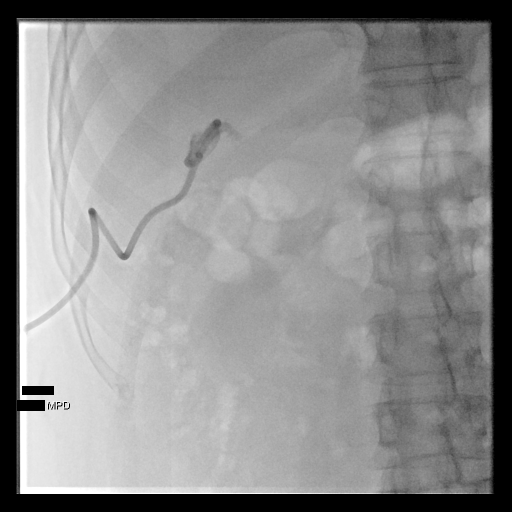
[frame 16/30]
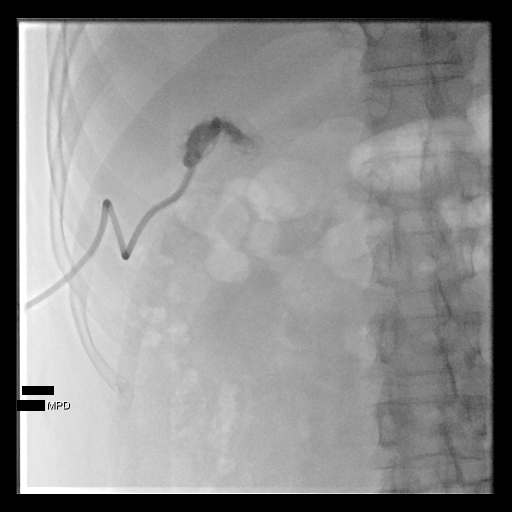
[frame 18/30]
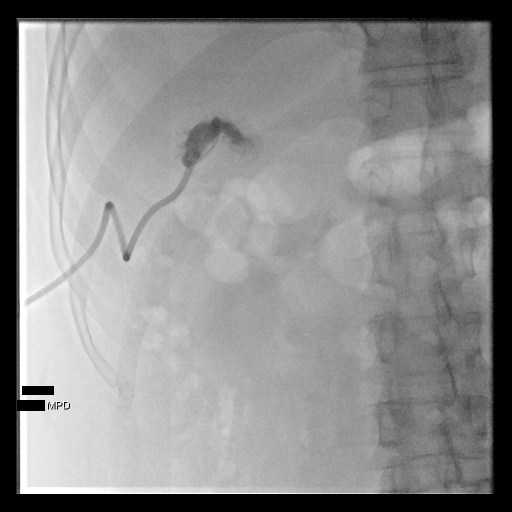
[frame 26/30]
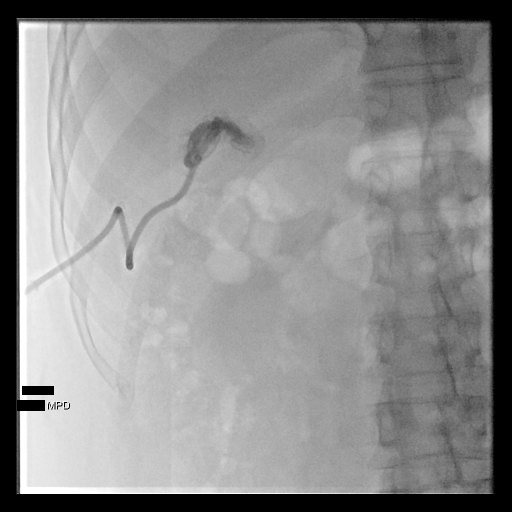

[Series 3: body 4 care · 1 of 1 slices shown]
[im 1/1]
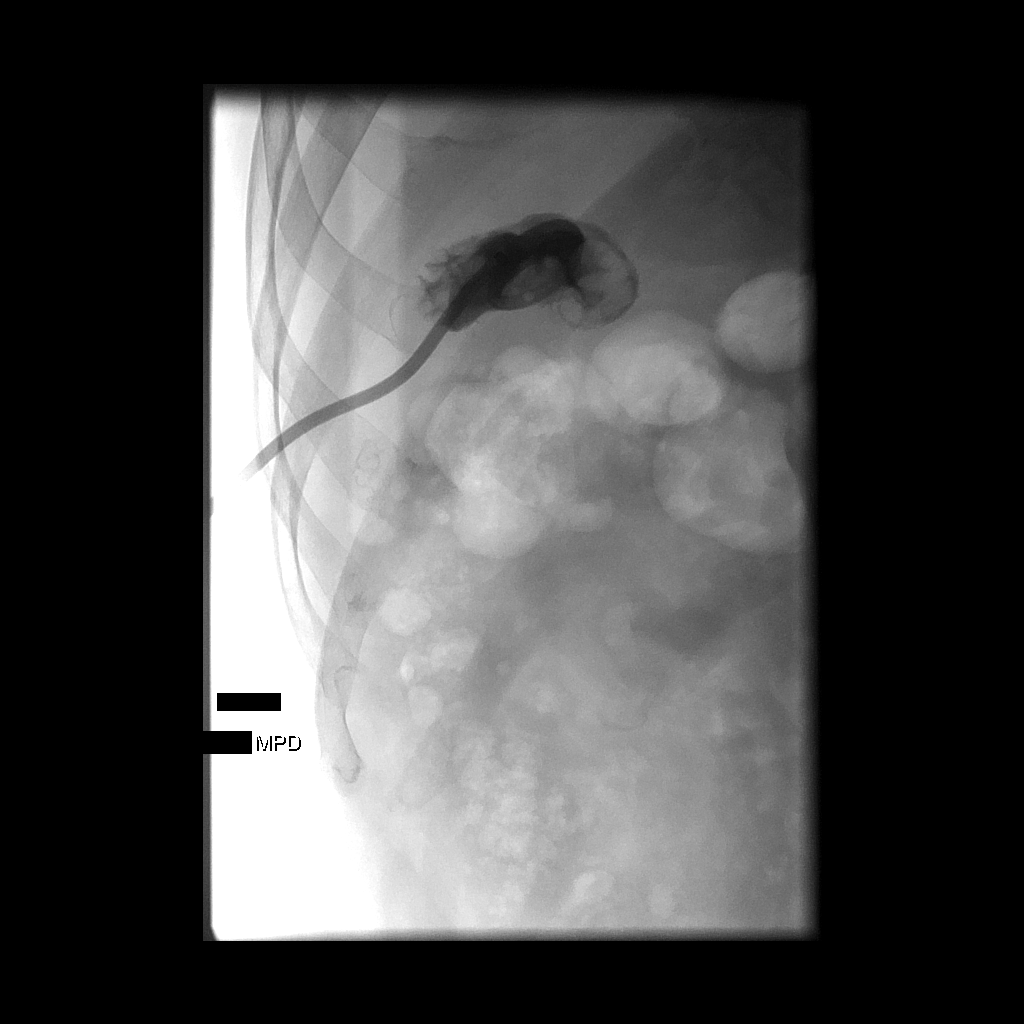

[5 of 5 positions shown; findings below may reference images not displayed]

EXAM:
CHOLANGIOGRAM THROUGH EXISTING TUBE

CHOLECYSTOSTOMY TUBE EXCHANGE WITH FLUOROSCOPY

MEDICATIONS:
None

ANESTHESIA/SEDATION:
None

FLUOROSCOPY TIME:  Fluoroscopy Time: 1 minute, 30 seconds, 7 mGy

COMPLICATIONS:
None immediate.

PROCEDURE:
Informed written consent was obtained from the patient after a
thorough discussion of the procedural risks, benefits and
alternatives. All questions were addressed. Maximal Sterile Barrier
Technique was utilized including caps, mask, sterile gowns, sterile
gloves, sterile drape, hand hygiene and skin antiseptic. A timeout
was performed prior to the initiation of the procedure.

The cholecystostomy tube and surrounding skin were prepped and
draped in sterile fashion. Tube was injected with contrast. The
retention suture was cut. Catheter was cut and removed over a
Bentson wire. A new 10 French multipurpose drain was advanced over
the wire and reconstituted in the gallbladder. Skin around the
catheter was anesthetized with 1% lidocaine. Catheter was sutured to
the skin. Drain was flushed with normal saline and attached to a
gravity bag.

Fluoroscopic images were taken and saved for this procedure.
FINDINGS: Cholecystostomy tube pigtail was still within the gallbladder lumen
but a portion of the tube had been pulled [DATE] French tube
positioned within the gallbladder. Gallbladder lumen is irregular.
No filling of the cystic duct.
IMPRESSION: Successful exchange of the cholecystostomy tube with fluoroscopy.

## 2022-12-03 IMAGING — XA IR EXCHANGE BILARY DRAIN
1 series · 2 of 2 positions shown · non-contrast
Comparison: none

INDICATION: 75-year-old male with a history of acute acalculous cholecystitis
status post placement of percutaneous cholecystostomy tube on
06/02/2021. this catheter was recently exchanged on 07/06/2021 and
he presents today for leaking.

[Series 1: ir radiologist eval & mgmt · 2 of 2 slices shown]
[im 1/2]
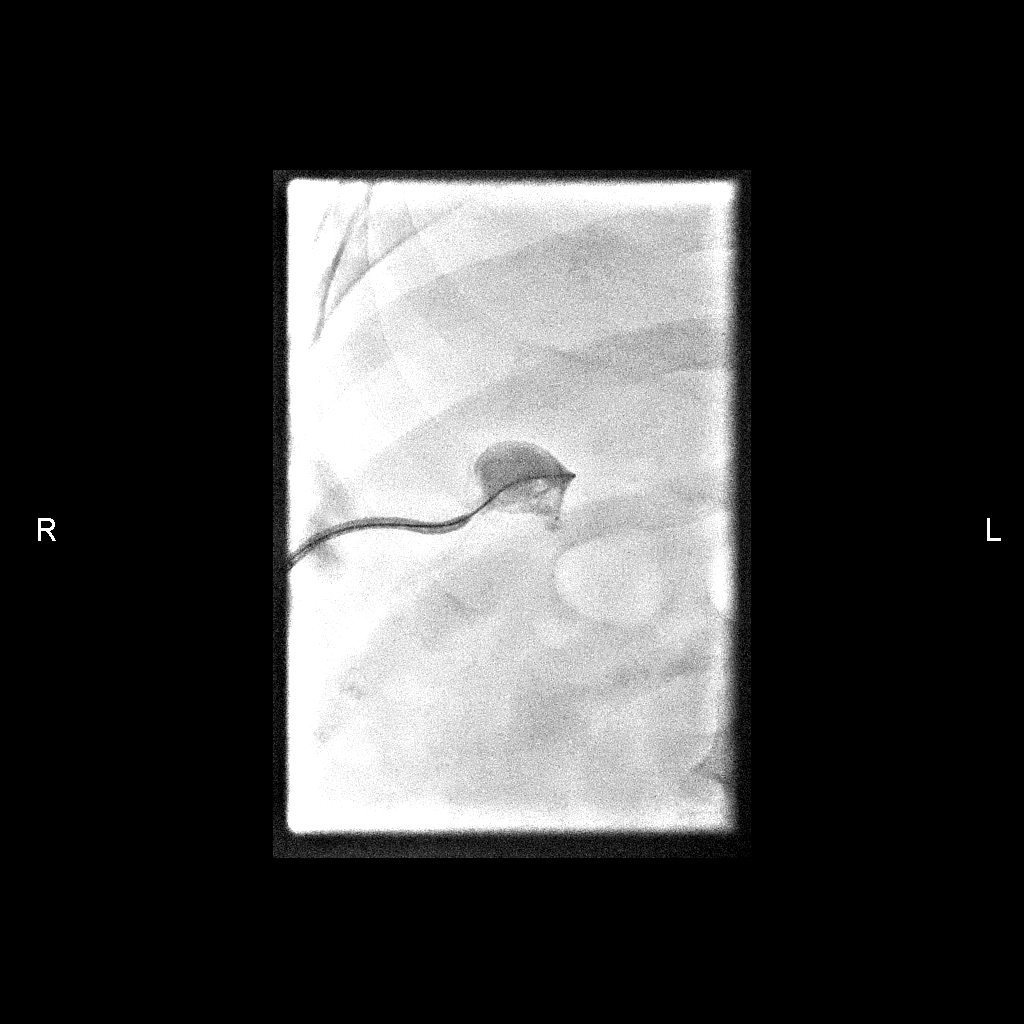
[im 2/2]
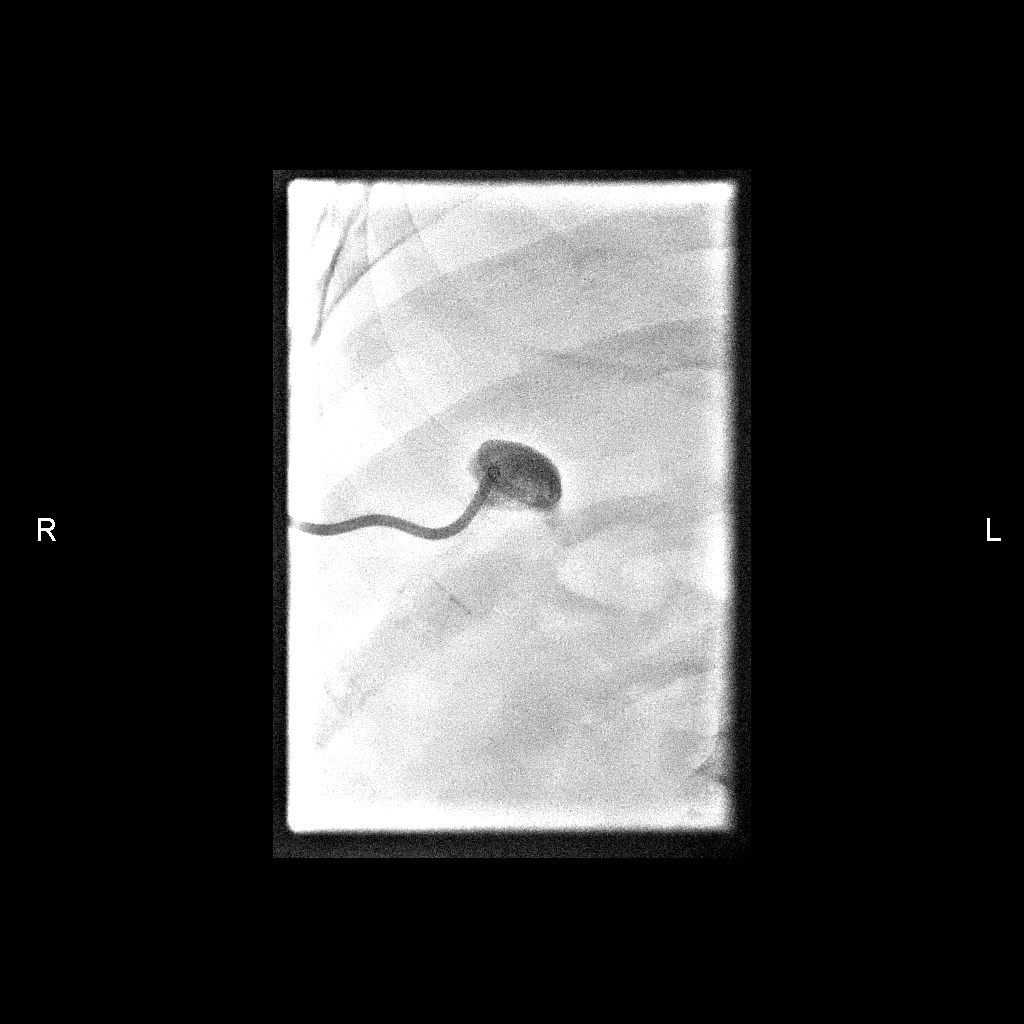

[2 of 2 positions shown; findings below may reference images not displayed]

EXAM:
Cholecystostomy tube exchange

MEDICATIONS:
None.

ANESTHESIA/SEDATION:
None.

FLUOROSCOPY TIME:  Fluoroscopy Time: 1 minutes 6 seconds (15 mGy).

COMPLICATIONS:
None immediate.

PROCEDURE:
Informed written consent was obtained from the patient after a
thorough discussion of the procedural risks, benefits and
alternatives. All questions were addressed. Maximal Sterile Barrier
Technique was utilized including caps, mask, sterile gowns, sterile
gloves, sterile drape, hand hygiene and skin antiseptic. A timeout
was performed prior to the initiation of the procedure.

Initial fluoroscopic imaging demonstrates a percutaneous
cholecystostomy tube with the pigtail straight and more lateral than
expected concerning for at least partial displacement. A gentle hand
injection of contrast material identifies that the catheter has
pulled almost completely out of the gallbladder lumen. There is a
faint tract from the tip of the catheter into the gallbladder lumen.

The catheter was transected and a wire carefully advanced through
the catheter and back into the gallbladder lumen. The existing
drainage catheter was then removed over the wire. Schuermans Kolukisa
French all-purpose drainage catheter was advanced over the wire and
into the gallbladder lumen. The catheter pigtail was formed.
Contrast injection opacifies the gallbladder lumen. Of note, the
gallbladder is significantly contracted around the cholecystostomy
tube. No contrast material enters the cystic duct or biliary tree.
IMPRESSION: 1. Nearly displaced percutaneous cholecystostomy tube.
2. Successful tube rescue and replacement with a new 10.2 French
percutaneous cholecystostomy tube.
3. Of note, the cystic duct remains obstructed.

## 2022-12-06 DIAGNOSIS — E113312 Type 2 diabetes mellitus with moderate nonproliferative diabetic retinopathy with macular edema, left eye: Secondary | ICD-10-CM | POA: Diagnosis not present

## 2022-12-07 IMAGING — XA IR CHOLANGIOGRAM VIA EXIST CATHETER
4 series · 14 of 19 positions shown · non-contrast
Comparison: none

Addendum:
INDICATION: Cholecystostomy tube with decreased output

[Series 1: fl (-) angio · 3 acquisitions, 7 frames shown (1 of 2)]
[im 1/3]
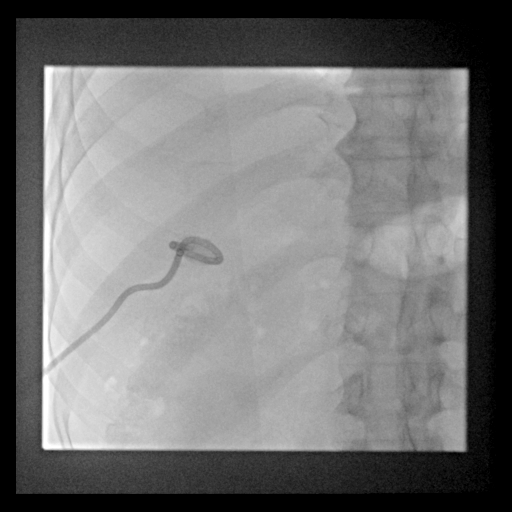
[im 1/3]
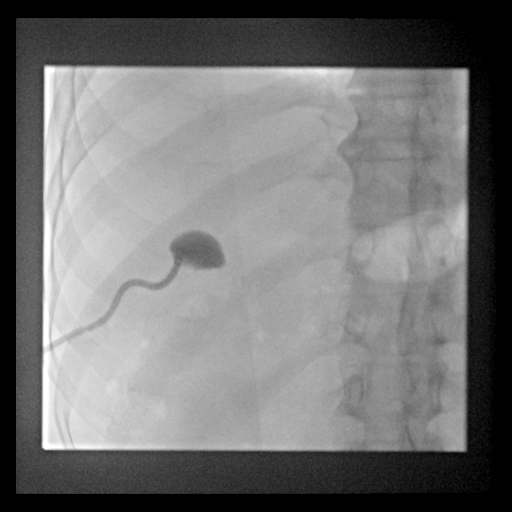
[im 1/3]
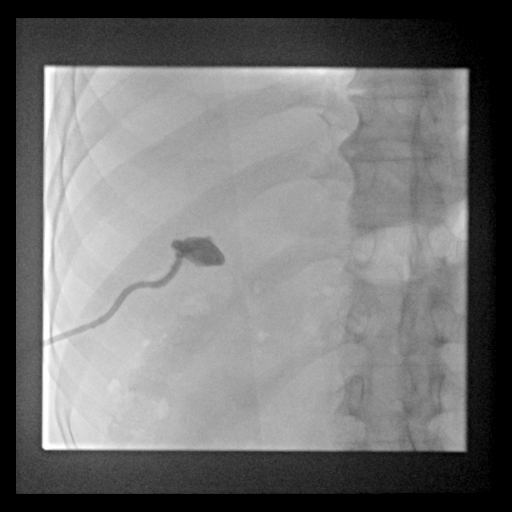
[im 2/3]
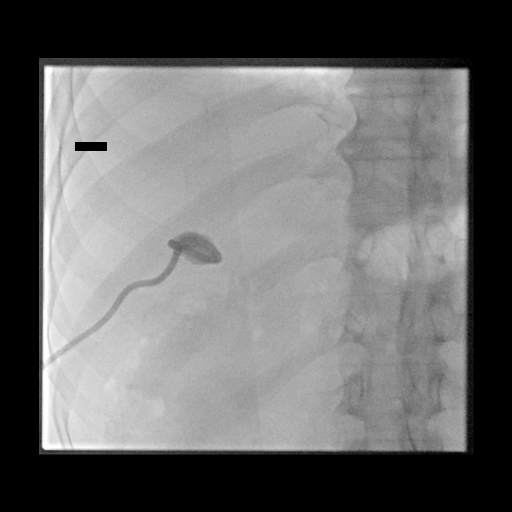
[im 2/3]
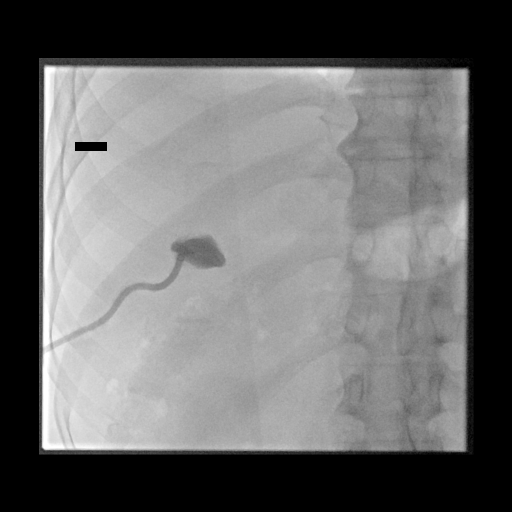
[im 2/3]
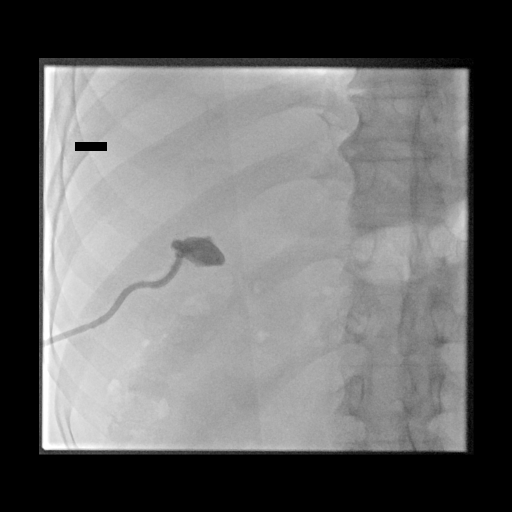
[im 3/3]
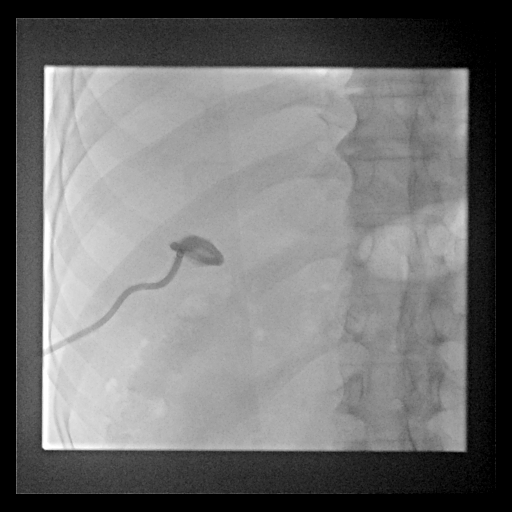

[Series 2: (id) body · 3 of 395 frames shown (1 of 2)]
[frame 198/395]
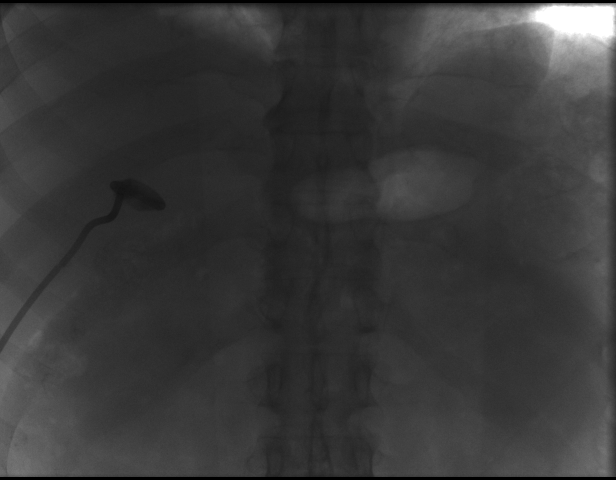
[frame 221/395]
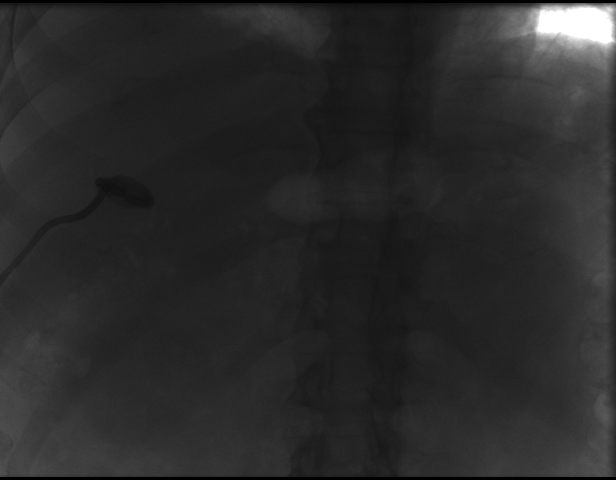
[frame 336/395]
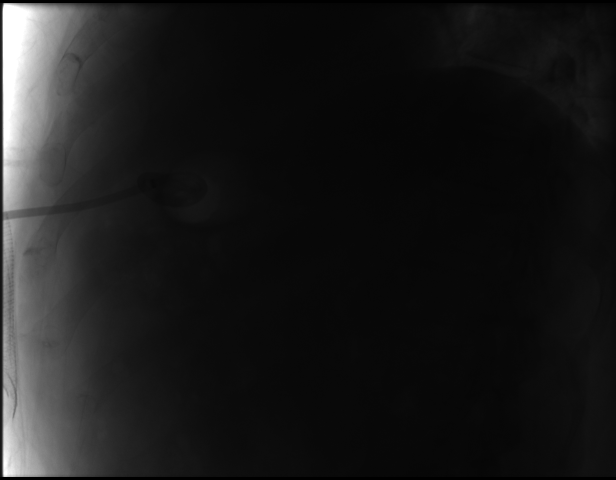

[Series 3: (id) body · 3 of 395 frames shown (2 of 2)]
[frame 131/395]
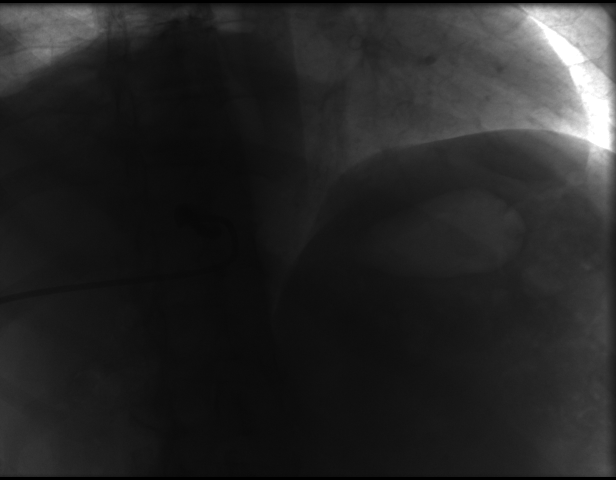
[frame 198/395]
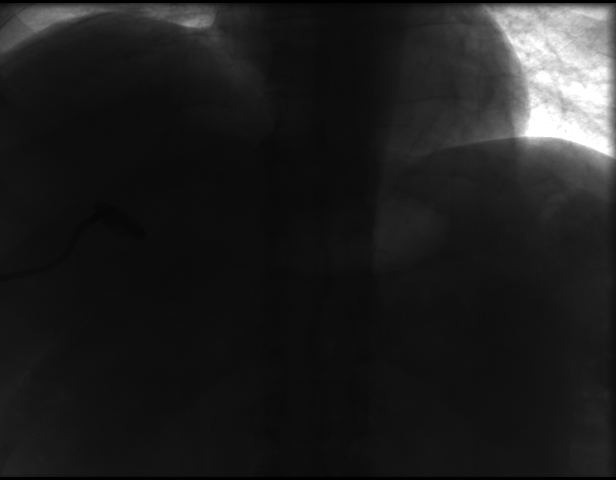
[frame 336/395]
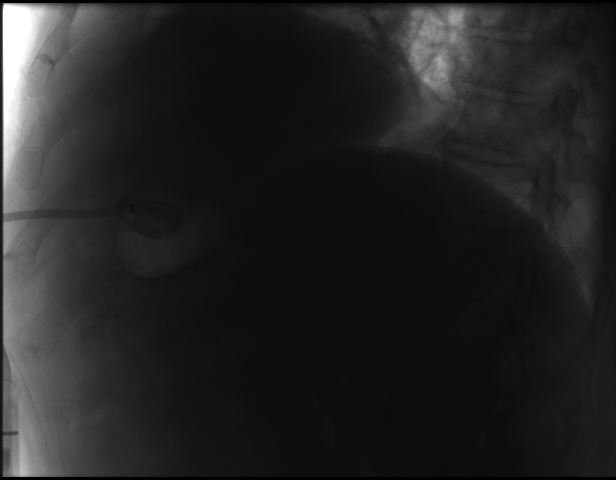

[Series 4: fl (-) angio · 1 of 2 slices shown (2 of 2)]
[im 2/2]
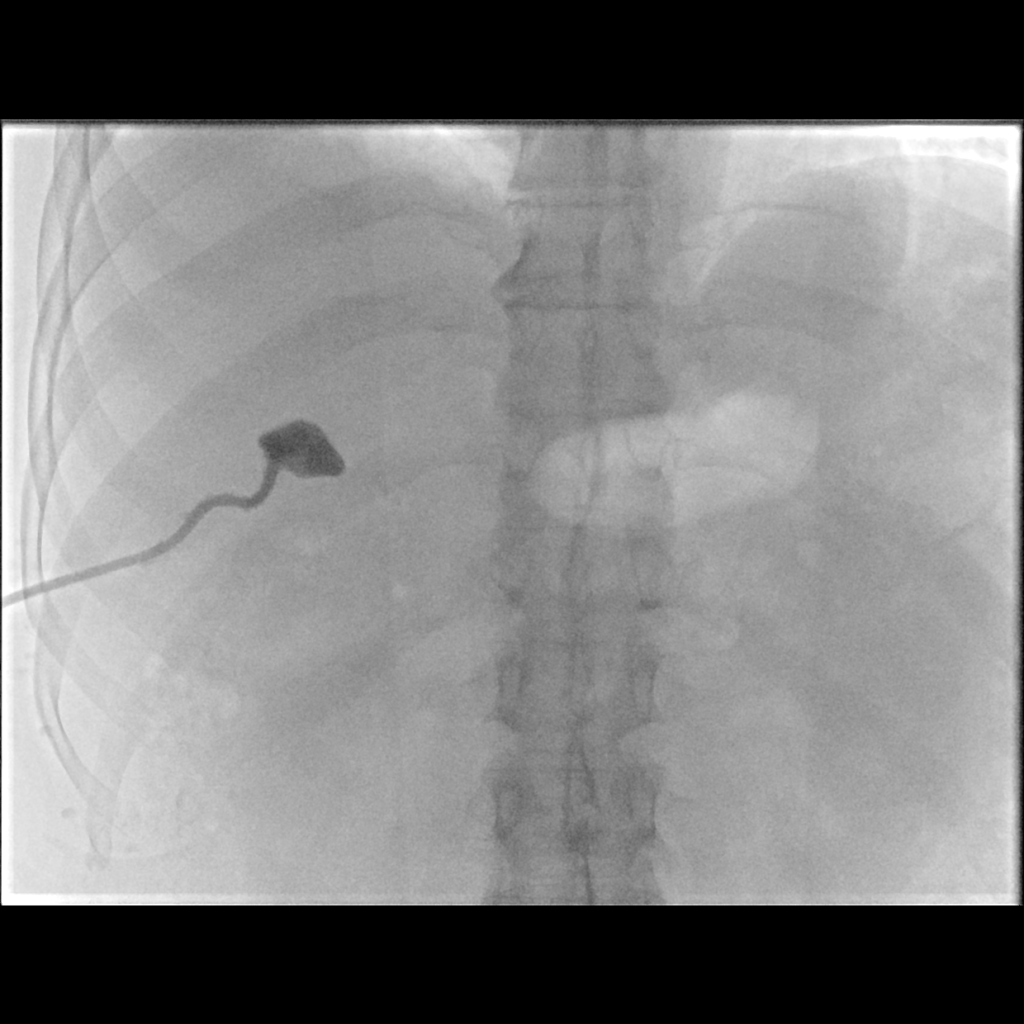

[14 of 19 positions shown; findings below may reference images not displayed]

EXAM:
FLUOROSCOPIC INJECTION OF THE CHOLECYSTOSTOMY

Date:  07/21/2021 07/21/2021 [DATE]

Radiologist:  Amaral, Edcleia

Guidance:  FLUOROSCOPIC

FLUOROSCOPY TIME:  0 minutes 54 seconds (435 mGy).

MEDICATIONS:
NONE.

CONTRAST:  10mL OMNIPAQUE IOHEXOL 300 MG/ML  SOLN

COMPLICATIONS:
None immediate.

PROCEDURE:
Informed consent was obtained from the patient following explanation
of the procedure, risks, benefits and alternatives. The patient
understands, agrees and consents for the procedure. All questions
were addressed. A time out was performed.

Maximal barrier sterile technique utilized including caps, mask,
sterile gowns, sterile gloves, large sterile drape, hand hygiene,
and CHLORAPREP.

existing right upper quadrant cholecystostomy was injected with
contrast. fluoroscopic imaging performed. the catheter is patent
with the retention loop formed in the gallbladder fundus.
gallbladder is collapsed and obstructed. cystic duct not visualized.
contrast and bile aspirated.
IMPRESSION: Cholecystostomy catheter within a collapsed obstructive gallbladder.
Catheter remains patent.

ADDENDUM:
IR limited intra procedural CT was included. Limited CT performed in
the IR fluoroscopy suite to determine if the catheter is within the
gallbladder and to see if the gallbladder was distended.
Cholecystostomy appears to be within a collapsed gallbladder. No
gallbladder distension. Small right pleural effusion. No other acute
intra-abdominal finding within the limits of noncontrast CT.

*** End of Addendum ***
EXAM:
FLUOROSCOPIC INJECTION OF THE CHOLECYSTOSTOMY

Date:  07/21/2021 07/21/2021 [DATE]

Radiologist:  Amaral, Edcleia

Guidance:  FLUOROSCOPIC

FLUOROSCOPY TIME:  0 minutes 54 seconds (435 mGy).

MEDICATIONS:
NONE.

CONTRAST:  10mL OMNIPAQUE IOHEXOL 300 MG/ML  SOLN

COMPLICATIONS:
None immediate.

PROCEDURE:
Informed consent was obtained from the patient following explanation
of the procedure, risks, benefits and alternatives. The patient
understands, agrees and consents for the procedure. All questions
were addressed. A time out was performed.

Maximal barrier sterile technique utilized including caps, mask,
sterile gowns, sterile gloves, large sterile drape, hand hygiene,
and CHLORAPREP.

existing right upper quadrant cholecystostomy was injected with
contrast. fluoroscopic imaging performed. the catheter is patent
with the retention loop formed in the gallbladder fundus.
gallbladder is collapsed and obstructed. cystic duct not visualized.
contrast and bile aspirated.
IMPRESSION: Cholecystostomy catheter within a collapsed obstructive gallbladder.
Catheter remains patent.

## 2023-01-09 DIAGNOSIS — E113311 Type 2 diabetes mellitus with moderate nonproliferative diabetic retinopathy with macular edema, right eye: Secondary | ICD-10-CM | POA: Diagnosis not present

## 2023-02-09 ENCOUNTER — Ambulatory Visit
Admission: EM | Admit: 2023-02-09 | Discharge: 2023-02-09 | Disposition: A | Discharge status: Home / Self Care | Payer: Medicare Other | Source: Home / Self Care

## 2023-02-09 DIAGNOSIS — U071 COVID-19: Secondary | ICD-10-CM | POA: Insufficient documentation

## 2023-02-09 MED ORDER — BENZONATATE 100 MG PO CAPS: 100.0000 mg | ORAL_CAPSULE | Freq: Three times a day (TID) | ORAL | 0 refills | Status: AC | PRN

## 2023-02-09 NOTE — ED Triage Notes (Signed)
Patient states that wife tested positive for COVID. He done a home test last night that was positive. Patient has had a cough since Monday. Here for antivirals.

## 2023-02-09 NOTE — Discharge Instructions (Signed)
COVID test is pending.  Will call if it is positive.  You are currently outside the treatment window for COVID antivirals.  I have sent you cough medication to help alleviate symptoms.  Follow-up if any symptoms persist or worsen.

## 2023-02-09 NOTE — ED Provider Notes (Signed)
[facility]    CSN: [id number] Arrival date & time: 02/09/23  1011      History   Chief Complaint Chief Complaint  Patient presents with   Covid Positive    HPI Andrew Duncan is a 77 y.o. male.   Patient presents with 6 day history of cough and runny nose.  Reports that he took a COVID test at home that was positive last night.  Reports his wife also has COVID-19.  Patient denies chest pain, shortness of breath, gastrointestinal symptoms.  Patient has taken Mucinex for symptoms with some improvement.  Reports history of asthma in childhood but no complications since.  Denies any fever. Patient requesting repeat covid testing.      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   Acute cholecystitis 06/02/2021   Acute respiratory failure with hypoxia (HCC) 06/02/2021   Transaminitis 06/01/2021   Type 2 diabetes mellitus without complication, with long-term current use of insulin (HCC) 06/01/2021   Hyperlipidemia 06/01/2021   Hypokalemia 06/01/2021   Hyponatremia 06/01/2021   Pedal edema 06/01/2021   Sepsis due to pneumonia (HCC) 05/31/2021   Essential hypertension 05/31/2021    Past Surgical History:  Procedure Laterality Date   ANAL FISSURE REPAIR     CHOLECYSTECTOMY N/A 08/11/2021   Procedure: ATTEMPTED LAPAROSCOPIC CHOLECYSTECTOMY;  Surgeon: Fritzi Mandes, MD;  Location: [facility] OR;  Service: General;  Laterality: N/A;   CHOLECYSTECTOMY N/A 08/11/2021   Procedure: OPEN CHOLECYSTECTOMY;  Surgeon: Fritzi Mandes, MD;  Location: [facility] OR;  Service: General;  Laterality: N/A;   IR CHOLANGIOGRAM EXISTING TUBE  07/21/2021   IR CT SPINE LTD  07/21/2021   IR EXCHANGE BILIARY DRAIN   07/06/2021   IR EXCHANGE BILIARY DRAIN  07/17/2021   IR PERC CHOLECYSTOSTOMY  06/02/2021   left elbow fracture pinning     pinning       Home Medications    Prior to Admission medications   Medication Sig Start Date End Date Taking? Authorizing Provider  acetaminophen (TYLENOL) 500 MG tablet Take 1 tablet (500 mg total) by mouth every 6 (six) hours as needed for mild pain. 08/15/21  Yes Fritzi Mandes, MD  aspirin EC 81 MG tablet Take 81 mg by mouth daily.   Yes [provider]  BD PEN NEEDLE NANO U/F 32G X 4 MM MISC USE NEEDLES FOR INJECTION TWICE A DAY 01/30/18  Yes [provider]  benzonatate (TESSALON) 100 MG capsule Take 1 capsule (100 mg total) by mouth every 8 (eight) hours as needed for cough. 02/09/23  Yes Ilan Kahrs, Rolly Salter E, FNP  cholecalciferol (VITAMIN D) 25 MCG (1000 UNIT) tablet Take 1,000 Units by mouth daily.   Yes [provider]  docusate sodium (COLACE) 100 MG capsule Take 1 capsule (100 mg total) by mouth 2 (two) times daily. 08/15/21  Yes Fritzi Mandes, MD  glipiZIDE (GLUCOTROL XL) 10 MG 24 hr tablet Take 10 mg by mouth daily. 02/28/18  Yes [provider]  JANUVIA 100 MG tablet Take 100 mg by mouth daily. 02/24/18  Yes [provider]  LANTUS SOLOSTAR 100 UNIT/ML Solostar Pen  40 Units at bedtime. 02/14/18  Yes [provider]  latanoprost (XALATAN) 0.005 % ophthalmic solution Place 1 drop into both eyes at bedtime. 01/31/18  Yes [provider]  losartan (COZAAR) 25 MG tablet Take 25 mg by mouth daily.   Yes [provider]  metFORMIN (GLUCOPHAGE) 500 MG tablet Take 500 mg by mouth 2 (two) times daily with a meal. 03/17/18  Yes [provider]  Multiple Vitamin (MULTIVITAMIN WITH MINERALS) TABS tablet Take 1 tablet by mouth daily.   Yes [provider]  ondansetron (ZOFRAN-ODT) 4 MG disintegrating tablet Take 1 tablet (4 mg total) by mouth every 6 (six) hours as needed for nausea. 08/15/21  Yes  Fritzi Mandes, MD  oxyCODONE (OXY IR/ROXICODONE) 5 MG immediate release tablet Take 1 tablet (5 mg total) by mouth every 6 (six) hours as needed for severe pain. 08/15/21  Yes Fritzi Mandes, MD  OZEMPIC, 0.25 OR 0.5 MG/DOSE, 2 MG/1.5ML SOPN Inject 0.5 mg into the skin once a week. 07/22/21  Yes [provider]  polyethylene glycol (MIRALAX / GLYCOLAX) 17 g packet Take 17 g by mouth daily. 08/15/21  Yes Fritzi Mandes, MD  pravastatin (PRAVACHOL) 20 MG tablet Take 20 mg by mouth daily. 02/03/18  Yes [provider]  timolol (TIMOPTIC) 0.5 % ophthalmic solution Place 1 drop into both eyes 2 (two) times daily. 01/07/18  Yes [provider]  Transparent Dressings ([place] DRESSING 4"X4) PADS 1 each by Does not apply route daily. 06/05/21  Yes Burnadette Pop, MD    Family History Family History  Problem Relation Age of Onset   Diabetes Mother    Stroke Mother     Social History Social History   Tobacco Use   Smoking status: Never   Smokeless tobacco: Never  Vaping Use   Vaping status: Never Used  Substance Use Topics   Alcohol use: Not Currently   Drug use: Never     Allergies   Versed [midazolam]   Review of Systems Review of Systems Per HPI  Physical Exam Triage Vital Signs ED Triage Vitals  Encounter Vitals Group     BP 02/09/23 1116 (!) 150/80     Systolic BP Percentile --      Diastolic BP Percentile --      Pulse Rate 02/09/23 1116 82     Resp 02/09/23 1116 16     Temp 02/09/23 1116 98.3 F (36.8 C)     Temp Source 02/09/23 1116 Oral     SpO2 02/09/23 1116 95 %     Weight 02/09/23 1115 182 lb (82.6 kg)     Height --      Head Circumference --      Peak Flow --      Pain Score 02/09/23 1115 0     Pain Loc --      Pain Education --      Exclude from Growth Chart --    No data found.  Updated Vital Signs BP (!) 150/80 (BP Location: Left Arm)   Pulse 82   Temp 98.3 F (36.8 C) (Oral)   Resp 16   Wt 182 lb (82.6 kg)    SpO2 95%   BMI 30.29 kg/m   Visual Acuity Right Eye Distance:   Left Eye Distance:   Bilateral Distance:    Right Eye Near:   Left Eye Near:    Bilateral Near:     Physical Exam Constitutional:  General: He is not in acute distress.    Appearance: Normal appearance. He is not toxic-appearing or diaphoretic.  HENT:     Head: Normocephalic and atraumatic.     Right Ear: Tympanic membrane and ear canal normal.     Left Ear: Tympanic membrane and ear canal normal.     Nose: Congestion present.     Mouth/Throat:     Mouth: Mucous membranes are moist.     Pharynx: No posterior oropharyngeal erythema.  Eyes:     Extraocular Movements: Extraocular movements intact.     Conjunctiva/sclera: Conjunctivae normal.     Pupils: Pupils are equal, round, and reactive to light.  Cardiovascular:     Rate and Rhythm: Normal rate and regular rhythm.     Pulses: Normal pulses.     Heart sounds: Normal heart sounds.  Pulmonary:     Effort: Pulmonary effort is normal. No respiratory distress.     Breath sounds: Normal breath sounds. No stridor. No wheezing, rhonchi or rales.  Abdominal:     General: Abdomen is flat. Bowel sounds are normal.     Palpations: Abdomen is soft.  Musculoskeletal:        General: Normal range of motion.     Cervical back: Normal range of motion.  Skin:    General: Skin is warm and dry.  Neurological:     General: No focal deficit present.     Mental Status: He is alert and oriented to person, place, and time. Mental status is at baseline.  Psychiatric:        Mood and Affect: Mood normal.        Behavior: Behavior normal.      UC Treatments / Results  Labs (all labs ordered are listed, but only abnormal results are displayed) Labs Reviewed  SARS CORONAVIRUS 2 (TAT 6-24 HRS)    EKG   Radiology No results found.  Procedures Procedures (including critical care time)  Medications Ordered in UC Medications - No data to display  Initial  Impression / Assessment and Plan / UC Course  I have reviewed the triage vital signs and the nursing notes.  Pertinent labs & imaging results that were available during my care of the patient were reviewed by me and considered in my medical decision making (see chart for details).     Patient tested positive for COVID-19 with an at home test.  Patient requesting repeat testing so COVID test is pending.  Discussed with patient that he is outside the treatment window for COVID antivirals.  Will prescribe benzonatate to take as needed for cough and advised patient of supportive care and symptom management.  There are no adventitious lung sounds on exam and oxygen is normal so do not think that chest imaging or emergent evaluation is necessary.  Advised strict follow precautions.  Patient verbalized understanding and was agreeable with plan. Final Clinical Impressions(s) / UC Diagnoses   Final diagnoses:  COVID-19     Discharge Instructions      COVID test is pending.  Will call if it is positive.  You are currently outside the treatment window for COVID antivirals.  I have sent you cough medication to help alleviate symptoms.  Follow-up if any symptoms persist or worsen.    ED Prescriptions     Medication Sig Dispense Auth. Provider   benzonatate (TESSALON) 100 MG capsule Take 1 capsule (100 mg total) by mouth every 8 (eight) hours as needed for cough. 21 capsule Jackson, [place]  E, FNP      PDMP not reviewed this encounter.   Gustavus Bryant, [place] 02/09/23 [phone]

## 2023-02-10 LAB — SARS CORONAVIRUS 2 (TAT 6-24 HRS): SARS Coronavirus 2: POSITIVE — AB

## 2023-02-18 DIAGNOSIS — E113312 Type 2 diabetes mellitus with moderate nonproliferative diabetic retinopathy with macular edema, left eye: Secondary | ICD-10-CM | POA: Diagnosis not present

## 2023-03-08 DIAGNOSIS — E113311 Type 2 diabetes mellitus with moderate nonproliferative diabetic retinopathy with macular edema, right eye: Secondary | ICD-10-CM | POA: Diagnosis not present

## 2023-03-12 DIAGNOSIS — H40023 Open angle with borderline findings, high risk, bilateral: Secondary | ICD-10-CM | POA: Diagnosis not present

## 2023-03-26 DIAGNOSIS — Z23 Encounter for immunization: Secondary | ICD-10-CM | POA: Diagnosis not present

## 2023-05-08 DIAGNOSIS — E113312 Type 2 diabetes mellitus with moderate nonproliferative diabetic retinopathy with macular edema, left eye: Secondary | ICD-10-CM | POA: Diagnosis not present

## 2023-05-16 DIAGNOSIS — Z794 Long term (current) use of insulin: Secondary | ICD-10-CM | POA: Diagnosis not present

## 2023-05-16 DIAGNOSIS — E1142 Type 2 diabetes mellitus with diabetic polyneuropathy: Secondary | ICD-10-CM | POA: Diagnosis not present

## 2023-05-16 DIAGNOSIS — I700 Atherosclerosis of aorta: Secondary | ICD-10-CM | POA: Diagnosis not present

## 2023-05-16 DIAGNOSIS — E113492 Type 2 diabetes mellitus with severe nonproliferative diabetic retinopathy without macular edema, left eye: Secondary | ICD-10-CM | POA: Diagnosis not present

## 2023-05-16 DIAGNOSIS — E7800 Pure hypercholesterolemia, unspecified: Secondary | ICD-10-CM | POA: Diagnosis not present

## 2023-05-16 DIAGNOSIS — Z0000 Encounter for general adult medical examination without abnormal findings: Secondary | ICD-10-CM | POA: Diagnosis not present

## 2023-05-16 DIAGNOSIS — Z23 Encounter for immunization: Secondary | ICD-10-CM | POA: Diagnosis not present

## 2023-05-16 DIAGNOSIS — I10 Essential (primary) hypertension: Secondary | ICD-10-CM | POA: Diagnosis not present

## 2023-05-17 DIAGNOSIS — E113311 Type 2 diabetes mellitus with moderate nonproliferative diabetic retinopathy with macular edema, right eye: Secondary | ICD-10-CM | POA: Diagnosis not present

## 2023-07-03 DIAGNOSIS — Z23 Encounter for immunization: Secondary | ICD-10-CM | POA: Diagnosis not present

## 2023-07-26 DIAGNOSIS — E113312 Type 2 diabetes mellitus with moderate nonproliferative diabetic retinopathy with macular edema, left eye: Secondary | ICD-10-CM | POA: Diagnosis not present

## 2023-08-30 DIAGNOSIS — E113311 Type 2 diabetes mellitus with moderate nonproliferative diabetic retinopathy with macular edema, right eye: Secondary | ICD-10-CM | POA: Diagnosis not present

## 2023-09-06 DIAGNOSIS — E113313 Type 2 diabetes mellitus with moderate nonproliferative diabetic retinopathy with macular edema, bilateral: Secondary | ICD-10-CM | POA: Diagnosis not present

## 2023-09-06 DIAGNOSIS — H59813 Chorioretinal scars after surgery for detachment, bilateral: Secondary | ICD-10-CM | POA: Diagnosis not present

## 2023-09-06 DIAGNOSIS — H3582 Retinal ischemia: Secondary | ICD-10-CM | POA: Diagnosis not present

## 2023-09-10 DIAGNOSIS — H26493 Other secondary cataract, bilateral: Secondary | ICD-10-CM | POA: Diagnosis not present

## 2023-09-10 DIAGNOSIS — H524 Presbyopia: Secondary | ICD-10-CM | POA: Diagnosis not present

## 2023-09-10 DIAGNOSIS — H40023 Open angle with borderline findings, high risk, bilateral: Secondary | ICD-10-CM | POA: Diagnosis not present

## 2023-09-10 DIAGNOSIS — H04123 Dry eye syndrome of bilateral lacrimal glands: Secondary | ICD-10-CM | POA: Diagnosis not present

## 2023-09-10 DIAGNOSIS — H5213 Myopia, bilateral: Secondary | ICD-10-CM | POA: Diagnosis not present

## 2023-09-10 DIAGNOSIS — E113293 Type 2 diabetes mellitus with mild nonproliferative diabetic retinopathy without macular edema, bilateral: Secondary | ICD-10-CM | POA: Diagnosis not present

## 2023-10-04 DIAGNOSIS — E113312 Type 2 diabetes mellitus with moderate nonproliferative diabetic retinopathy with macular edema, left eye: Secondary | ICD-10-CM | POA: Diagnosis not present

## 2023-12-25 DIAGNOSIS — E113312 Type 2 diabetes mellitus with moderate nonproliferative diabetic retinopathy with macular edema, left eye: Secondary | ICD-10-CM | POA: Diagnosis not present

## 2023-12-30 DIAGNOSIS — E113311 Type 2 diabetes mellitus with moderate nonproliferative diabetic retinopathy with macular edema, right eye: Secondary | ICD-10-CM | POA: Diagnosis not present

## 2024-03-03 DIAGNOSIS — Z5181 Encounter for therapeutic drug level monitoring: Secondary | ICD-10-CM | POA: Diagnosis not present

## 2024-03-03 DIAGNOSIS — Z794 Long term (current) use of insulin: Secondary | ICD-10-CM | POA: Diagnosis not present

## 2024-03-03 DIAGNOSIS — E1142 Type 2 diabetes mellitus with diabetic polyneuropathy: Secondary | ICD-10-CM | POA: Diagnosis not present

## 2024-03-03 DIAGNOSIS — I10 Essential (primary) hypertension: Secondary | ICD-10-CM | POA: Diagnosis not present

## 2024-03-03 DIAGNOSIS — E113492 Type 2 diabetes mellitus with severe nonproliferative diabetic retinopathy without macular edema, left eye: Secondary | ICD-10-CM | POA: Diagnosis not present

## 2024-03-03 DIAGNOSIS — E7800 Pure hypercholesterolemia, unspecified: Secondary | ICD-10-CM | POA: Diagnosis not present

## 2024-03-03 DIAGNOSIS — I700 Atherosclerosis of aorta: Secondary | ICD-10-CM | POA: Diagnosis not present

## 2024-03-04 DIAGNOSIS — Z23 Encounter for immunization: Secondary | ICD-10-CM | POA: Diagnosis not present

## 2024-03-11 DIAGNOSIS — E113312 Type 2 diabetes mellitus with moderate nonproliferative diabetic retinopathy with macular edema, left eye: Secondary | ICD-10-CM | POA: Diagnosis not present

## 2024-03-12 DIAGNOSIS — H40023 Open angle with borderline findings, high risk, bilateral: Secondary | ICD-10-CM | POA: Diagnosis not present

## 2024-03-23 DIAGNOSIS — D649 Anemia, unspecified: Secondary | ICD-10-CM | POA: Diagnosis not present

## 2024-03-24 DIAGNOSIS — D649 Anemia, unspecified: Secondary | ICD-10-CM | POA: Diagnosis not present

## 2024-03-31 DIAGNOSIS — E113311 Type 2 diabetes mellitus with moderate nonproliferative diabetic retinopathy with macular edema, right eye: Secondary | ICD-10-CM | POA: Diagnosis not present

## 2024-05-15 DIAGNOSIS — D649 Anemia, unspecified: Secondary | ICD-10-CM | POA: Diagnosis not present

## 2024-05-20 DIAGNOSIS — E113312 Type 2 diabetes mellitus with moderate nonproliferative diabetic retinopathy with macular edema, left eye: Secondary | ICD-10-CM | POA: Diagnosis not present
